# Patient Record
Sex: Female | Born: 1960 | Race: White | Hispanic: No | Marital: Married | Smoking: Never smoker
Health system: Southern US, Community
[De-identification: ages and names within clinical notes are randomized; demographics above are authoritative.]

## PROBLEM LIST (undated history)

## (undated) DIAGNOSIS — J45909 Unspecified asthma, uncomplicated: Secondary | ICD-10-CM

## (undated) DIAGNOSIS — I10 Essential (primary) hypertension: Secondary | ICD-10-CM

## (undated) DIAGNOSIS — M1990 Unspecified osteoarthritis, unspecified site: Secondary | ICD-10-CM

## (undated) DIAGNOSIS — E119 Type 2 diabetes mellitus without complications: Secondary | ICD-10-CM

## (undated) DIAGNOSIS — N200 Calculus of kidney: Secondary | ICD-10-CM

---

## 2007-10-10 ENCOUNTER — Other Ambulatory Visit: Payer: Self-pay

## 2007-10-10 ENCOUNTER — Ambulatory Visit: Payer: Self-pay | Admitting: Internal Medicine

## 2007-10-10 ENCOUNTER — Emergency Department: Payer: Self-pay | Admitting: Emergency Medicine

## 2011-01-13 LAB — HM PAP SMEAR: HM PAP: NEGATIVE

## 2011-08-29 ENCOUNTER — Ambulatory Visit: Payer: Self-pay | Admitting: Internal Medicine

## 2012-05-10 DIAGNOSIS — E785 Hyperlipidemia, unspecified: Secondary | ICD-10-CM | POA: Insufficient documentation

## 2012-05-10 DIAGNOSIS — E1169 Type 2 diabetes mellitus with other specified complication: Secondary | ICD-10-CM | POA: Insufficient documentation

## 2013-06-22 ENCOUNTER — Ambulatory Visit: Payer: Self-pay | Admitting: Emergency Medicine

## 2013-09-13 ENCOUNTER — Ambulatory Visit: Payer: Self-pay | Admitting: Physician Assistant

## 2013-09-23 DIAGNOSIS — K429 Umbilical hernia without obstruction or gangrene: Secondary | ICD-10-CM | POA: Insufficient documentation

## 2013-10-07 ENCOUNTER — Ambulatory Visit: Payer: Self-pay | Admitting: Physician Assistant

## 2013-10-07 LAB — CBC WITH DIFFERENTIAL/PLATELET
BASOS ABS: 0.1 10*3/uL (ref 0.0–0.1)
Basophil %: 0.8 %
EOS ABS: 0.4 10*3/uL (ref 0.0–0.7)
Eosinophil %: 4.2 %
HCT: 40.9 % (ref 35.0–47.0)
HGB: 13.6 g/dL (ref 12.0–16.0)
Lymphocyte #: 1.9 10*3/uL (ref 1.0–3.6)
Lymphocyte %: 22.2 %
MCH: 28.4 pg (ref 26.0–34.0)
MCHC: 33.2 g/dL (ref 32.0–36.0)
MCV: 85 fL (ref 80–100)
Monocyte #: 0.7 x10 3/mm (ref 0.2–0.9)
Monocyte %: 8.3 %
NEUTROS PCT: 64.5 %
Neutrophil #: 5.6 10*3/uL (ref 1.4–6.5)
Platelet: 390 10*3/uL (ref 150–440)
RBC: 4.79 10*6/uL (ref 3.80–5.20)
RDW: 14.1 % (ref 11.5–14.5)
WBC: 8.7 10*3/uL (ref 3.6–11.0)

## 2013-10-07 LAB — URINALYSIS, COMPLETE
BACTERIA: NEGATIVE
Bilirubin,UR: NEGATIVE
GLUCOSE, UR: NEGATIVE mg/dL (ref 0–75)
Ketone: NEGATIVE
Leukocyte Esterase: NEGATIVE
Nitrite: NEGATIVE
Ph: 6 (ref 4.5–8.0)
Protein: NEGATIVE
SPECIFIC GRAVITY: 1.01 (ref 1.003–1.030)
WBC UR: NONE SEEN /HPF (ref 0–5)

## 2013-10-07 LAB — COMPREHENSIVE METABOLIC PANEL
ALBUMIN: 3.6 g/dL (ref 3.4–5.0)
AST: 27 U/L (ref 15–37)
Alkaline Phosphatase: 104 U/L
Anion Gap: 12 (ref 7–16)
BILIRUBIN TOTAL: 0.3 mg/dL (ref 0.2–1.0)
BUN: 11 mg/dL (ref 7–18)
CO2: 27 mmol/L (ref 21–32)
CREATININE: 0.94 mg/dL (ref 0.60–1.30)
Calcium, Total: 9.5 mg/dL (ref 8.5–10.1)
Chloride: 100 mmol/L (ref 98–107)
EGFR (African American): >60
GLUCOSE: 146 mg/dL — AB (ref 65–99)
OSMOLALITY: 280 (ref 275–301)
Potassium: 3.8 mmol/L (ref 3.5–5.1)
SGPT (ALT): 54 U/L (ref 12–78)
SODIUM: 139 mmol/L (ref 136–145)
TOTAL PROTEIN: 7.9 g/dL (ref 6.4–8.2)

## 2013-10-07 LAB — LIPASE, BLOOD: Lipase: 95 U/L (ref 73–393)

## 2013-10-07 LAB — AMYLASE: AMYLASE: 57 U/L (ref 25–115)

## 2013-12-01 ENCOUNTER — Ambulatory Visit: Payer: Self-pay | Admitting: Physician Assistant

## 2013-12-01 LAB — RAPID STREP-A WITH REFLX: MICRO TEXT REPORT: NEGATIVE

## 2013-12-01 LAB — RAPID INFLUENZA A&B ANTIGENS

## 2013-12-04 LAB — BETA STREP CULTURE(ARMC)

## 2014-01-09 ENCOUNTER — Ambulatory Visit: Payer: Self-pay | Admitting: Urology

## 2014-01-09 DIAGNOSIS — I10 Essential (primary) hypertension: Secondary | ICD-10-CM

## 2014-01-09 DIAGNOSIS — Z01810 Encounter for preprocedural cardiovascular examination: Secondary | ICD-10-CM

## 2014-01-09 LAB — BASIC METABOLIC PANEL
Anion Gap: 7 (ref 7–16)
BUN: 15 mg/dL (ref 7–18)
CO2: 29 mmol/L (ref 21–32)
CREATININE: 0.92 mg/dL (ref 0.60–1.30)
Calcium, Total: 8.6 mg/dL (ref 8.5–10.1)
Chloride: 103 mmol/L (ref 98–107)
GLUCOSE: 165 mg/dL — AB (ref 65–99)
Osmolality: 282 (ref 275–301)
Potassium: 3.5 mmol/L (ref 3.5–5.1)
SODIUM: 139 mmol/L (ref 136–145)

## 2014-01-11 ENCOUNTER — Ambulatory Visit: Payer: Self-pay | Admitting: Urology

## 2014-01-11 HISTORY — PX: LITHOTRIPSY: SUR834

## 2014-01-13 ENCOUNTER — Emergency Department: Payer: Self-pay | Admitting: Emergency Medicine

## 2014-01-13 LAB — BASIC METABOLIC PANEL
Anion Gap: 9 (ref 7–16)
BUN: 14 mg/dL (ref 7–18)
CALCIUM: 9.6 mg/dL (ref 8.5–10.1)
CHLORIDE: 99 mmol/L (ref 98–107)
Co2: 27 mmol/L (ref 21–32)
Creatinine: 1.19 mg/dL (ref 0.60–1.30)
EGFR (African American): >60
GFR CALC NON AF AMER: 53 — AB
GLUCOSE: 141 mg/dL — AB (ref 65–99)
Osmolality: 273 (ref 275–301)
POTASSIUM: 3.4 mmol/L — AB (ref 3.5–5.1)
SODIUM: 135 mmol/L — AB (ref 136–145)

## 2014-01-13 LAB — URINALYSIS, COMPLETE
Bacteria: NONE SEEN
Bilirubin,UR: NEGATIVE
Glucose,UR: NEGATIVE mg/dL (ref 0–75)
Leukocyte Esterase: NEGATIVE
Nitrite: NEGATIVE
PH: 5 (ref 4.5–8.0)
Protein: NEGATIVE
RBC,UR: 22 /HPF (ref 0–5)
SPECIFIC GRAVITY: 1.015 (ref 1.003–1.030)
Squamous Epithelial: 5
WBC UR: 3 /HPF (ref 0–5)

## 2014-01-13 LAB — CBC WITH DIFFERENTIAL/PLATELET
BASOS ABS: 0.1 10*3/uL (ref 0.0–0.1)
Basophil %: 0.6 %
Eosinophil #: 0.2 10*3/uL (ref 0.0–0.7)
Eosinophil %: 1.4 %
HCT: 41.8 % (ref 35.0–47.0)
HGB: 14 g/dL (ref 12.0–16.0)
LYMPHS PCT: 14.5 %
Lymphocyte #: 2 10*3/uL (ref 1.0–3.6)
MCH: 28.5 pg (ref 26.0–34.0)
MCHC: 33.5 g/dL (ref 32.0–36.0)
MCV: 85 fL (ref 80–100)
Monocyte #: 1 x10 3/mm — ABNORMAL HIGH (ref 0.2–0.9)
Monocyte %: 7.3 %
Neutrophil #: 10.5 10*3/uL — ABNORMAL HIGH (ref 1.4–6.5)
Neutrophil %: 76.2 %
Platelet: 383 10*3/uL (ref 150–440)
RBC: 4.91 10*6/uL (ref 3.80–5.20)
RDW: 14 % (ref 11.5–14.5)
WBC: 13.8 10*3/uL — ABNORMAL HIGH (ref 3.6–11.0)

## 2014-01-18 ENCOUNTER — Ambulatory Visit: Payer: Self-pay | Admitting: Urology

## 2014-03-27 DIAGNOSIS — I8393 Asymptomatic varicose veins of bilateral lower extremities: Secondary | ICD-10-CM | POA: Insufficient documentation

## 2014-03-27 LAB — HM HEPATITIS C SCREENING LAB: HM HEPATITIS C SCREENING: NEGATIVE

## 2014-06-23 ENCOUNTER — Ambulatory Visit: Payer: Self-pay | Admitting: Physician Assistant

## 2014-09-10 DIAGNOSIS — I10 Essential (primary) hypertension: Secondary | ICD-10-CM | POA: Insufficient documentation

## 2015-03-06 ENCOUNTER — Emergency Department: Payer: Commercial Indemnity

## 2015-03-06 ENCOUNTER — Emergency Department
Admission: EM | Admit: 2015-03-06 | Discharge: 2015-03-06 | Disposition: A | Discharge status: Home / Self Care | Payer: Commercial Indemnity | Attending: Emergency Medicine | Admitting: Emergency Medicine

## 2015-03-06 DIAGNOSIS — Z3202 Encounter for pregnancy test, result negative: Secondary | ICD-10-CM | POA: Diagnosis not present

## 2015-03-06 DIAGNOSIS — R109 Unspecified abdominal pain: Secondary | ICD-10-CM | POA: Diagnosis present

## 2015-03-06 DIAGNOSIS — N23 Unspecified renal colic: Secondary | ICD-10-CM | POA: Insufficient documentation

## 2015-03-06 LAB — BASIC METABOLIC PANEL
Anion gap: 11 (ref 5–15)
BUN: 19 mg/dL (ref 6–20)
CALCIUM: 9.4 mg/dL (ref 8.9–10.3)
CO2: 25 mmol/L (ref 22–32)
Chloride: 100 mmol/L — ABNORMAL LOW (ref 101–111)
Creatinine, Ser: 1.24 mg/dL — ABNORMAL HIGH (ref 0.44–1.00)
GFR calc Af Amer: 57 mL/min — ABNORMAL LOW (ref >60)
GFR, EST NON AFRICAN AMERICAN: 49 mL/min — AB (ref >60)
Glucose, Bld: 191 mg/dL — ABNORMAL HIGH (ref 65–99)
Potassium: 3.8 mmol/L (ref 3.5–5.1)
SODIUM: 136 mmol/L (ref 135–145)

## 2015-03-06 LAB — URINALYSIS COMPLETE WITH MICROSCOPIC (ARMC ONLY)
Bacteria, UA: NONE SEEN
Bilirubin Urine: NEGATIVE
Glucose, UA: NEGATIVE mg/dL
KETONES UR: NEGATIVE mg/dL
Leukocytes, UA: NEGATIVE
Nitrite: NEGATIVE
PH: 5 (ref 5.0–8.0)
PROTEIN: NEGATIVE mg/dL
SPECIFIC GRAVITY, URINE: 1.023 (ref 1.005–1.030)

## 2015-03-06 LAB — CBC WITH DIFFERENTIAL/PLATELET
Basophils Absolute: 0.1 10*3/uL (ref 0–0.1)
Basophils Relative: 1 %
Eosinophils Absolute: 0.4 10*3/uL (ref 0–0.7)
Eosinophils Relative: 4 %
HEMATOCRIT: 39 % (ref 35.0–47.0)
HEMOGLOBIN: 13 g/dL (ref 12.0–16.0)
LYMPHS ABS: 3 10*3/uL (ref 1.0–3.6)
LYMPHS PCT: 31 %
MCH: 28 pg (ref 26.0–34.0)
MCHC: 33.4 g/dL (ref 32.0–36.0)
MCV: 83.9 fL (ref 80.0–100.0)
MONO ABS: 0.8 10*3/uL (ref 0.2–0.9)
MONOS PCT: 9 %
NEUTROS ABS: 5.3 10*3/uL (ref 1.4–6.5)
NEUTROS PCT: 55 %
Platelets: 338 10*3/uL (ref 150–440)
RBC: 4.64 MIL/uL (ref 3.80–5.20)
RDW: 13.9 % (ref 11.5–14.5)
WBC: 9.6 10*3/uL (ref 3.6–11.0)

## 2015-03-06 LAB — PREGNANCY, URINE: PREG TEST UR: NEGATIVE

## 2015-03-06 MED ORDER — TAMSULOSIN HCL 0.4 MG PO CAPS: 0.4000 mg | ORAL_CAPSULE | Freq: Every day | ORAL | Status: DC

## 2015-03-06 MED ORDER — KETOROLAC TROMETHAMINE 30 MG/ML IJ SOLN
INTRAMUSCULAR | Status: AC
  Administered 2015-03-06: 30 mg via INTRAVENOUS
  Filled 2015-03-06: qty 1

## 2015-03-06 MED ORDER — SODIUM CHLORIDE 0.9 % IV BOLUS (SEPSIS)
1000.0000 mL | Freq: Once | INTRAVENOUS | Status: AC
  Administered 2015-03-06: 1000 mL via INTRAVENOUS

## 2015-03-06 MED ORDER — KETOROLAC TROMETHAMINE 10 MG PO TABS: 10.0000 mg | ORAL_TABLET | Freq: Four times a day (QID) | ORAL | Status: DC | PRN

## 2015-03-06 MED ORDER — ONDANSETRON HCL 4 MG PO TABS: 4.0000 mg | ORAL_TABLET | Freq: Four times a day (QID) | ORAL | Status: DC | PRN

## 2015-03-06 MED ORDER — KETOROLAC TROMETHAMINE 30 MG/ML IJ SOLN
30.0000 mg | Freq: Once | INTRAMUSCULAR | Status: AC
  Administered 2015-03-06: 30 mg via INTRAVENOUS

## 2015-03-06 NOTE — Discharge Instructions (Signed)
Kidney Stones °Kidney stones (urolithiasis) are deposits that form inside your kidneys. The intense pain is caused by the stone moving through the urinary tract. When the stone moves, the ureter goes into spasm around the stone. The stone is usually passed in the urine.  °CAUSES  °· A disorder that makes certain neck glands produce too much parathyroid hormone (primary hyperparathyroidism). °· A buildup of uric acid crystals, similar to gout in your joints. °· Narrowing (stricture) of the ureter. °· A kidney obstruction present at birth (congenital obstruction). °· Previous surgery on the kidney or ureters. °· Numerous kidney infections. °SYMPTOMS  °· Feeling sick to your stomach (nauseous). °· Throwing up (vomiting). °· Blood in the urine (hematuria). °· Pain that usually spreads (radiates) to the groin. °· Frequency or urgency of urination. °DIAGNOSIS  °· Taking a history and physical exam. °· Blood or urine tests. °· CT scan. °· Occasionally, an examination of the inside of the urinary bladder (cystoscopy) is performed. °TREATMENT  °· Observation. °· Increasing your fluid intake. °· Extracorporeal shock wave lithotripsy--This is a noninvasive procedure that uses shock waves to break up kidney stones. °· Surgery may be needed if you have severe pain or persistent obstruction. There are various surgical procedures. Most of the procedures are performed with the use of small instruments. Only small incisions are needed to accommodate these instruments, so recovery time is minimized. °The size, location, and chemical composition are all important variables that will determine the proper choice of action for you. Talk to your health care provider to better understand your situation so that you will minimize the risk of injury to yourself and your kidney.  °HOME CARE INSTRUCTIONS  °· Drink enough water and fluids to keep your urine clear or pale yellow. This will help you to pass the stone or stone fragments. °· Strain  all urine through the provided strainer. Keep all particulate matter and stones for your health care provider to see. The stone causing the pain may be as small as a grain of salt. It is very important to use the strainer each and every time you pass your urine. The collection of your stone will allow your health care provider to analyze it and verify that a stone has actually passed. The stone analysis will often identify what you can do to reduce the incidence of recurrences. °· Only take over-the-counter or prescription medicines for pain, discomfort, or fever as directed by your health care provider. °· Make a follow-up appointment with your health care provider as directed. °· Get follow-up X-rays if required. The absence of pain does not always mean that the stone has passed. It may have only stopped moving. If the urine remains completely obstructed, it can cause loss of kidney function or even complete destruction of the kidney. It is your responsibility to make sure X-rays and follow-ups are completed. Ultrasounds of the kidney can show blockages and the status of the kidney. Ultrasounds are not associated with any radiation and can be performed easily in a matter of minutes. °SEEK MEDICAL CARE IF: °· You experience pain that is progressive and unresponsive to any pain medicine you have been prescribed. °SEEK IMMEDIATE MEDICAL CARE IF:  °· Pain cannot be controlled with the prescribed medicine. °· You have a fever or shaking chills. °· The severity or intensity of pain increases over 18 hours and is not relieved by pain medicine. °· You develop a new onset of abdominal pain. °· You feel faint or pass out. °·   You are unable to urinate. MAKE SURE YOU:   Understand these instructions.  Will watch your condition.  Will get help right away if you are not doing well or get worse. Document Released: 08/31/2005 Document Revised: 05/03/2013 Document Reviewed: 02/01/2013 [facility] Patient Information 2015  [place], [place]. This information is not intended to replace advice given to you by your health care provider. Make sure you discuss any questions you have with your health care provider.  Abdominal Pain Many things can cause abdominal pain. Usually, abdominal pain is not caused by a disease and will improve without treatment. It can often be observed and treated at home. Your health care provider will do a physical exam and possibly order blood tests and X-rays to help determine the seriousness of your pain. However, in many cases, more time must pass before a clear cause of the pain can be found. Before that point, your health care provider may not know if you need more testing or further treatment. HOME CARE INSTRUCTIONS  Monitor your abdominal pain for any changes. The following actions may help to alleviate any discomfort you are experiencing:  Only take over-the-counter or prescription medicines as directed by your health care provider.  Do not take laxatives unless directed to do so by your health care provider.  Try a clear liquid diet (broth, tea, or water) as directed by your health care provider. Slowly move to a bland diet as tolerated. SEEK MEDICAL CARE IF:  You have unexplained abdominal pain.  You have abdominal pain associated with nausea or diarrhea.  You have pain when you urinate or have a bowel movement.  You experience abdominal pain that wakes you in the night.  You have abdominal pain that is worsened or improved by eating food.  You have abdominal pain that is worsened with eating fatty foods.  You have a fever. SEEK IMMEDIATE MEDICAL CARE IF:   Your pain does not go away within 2 hours.  You keep throwing up (vomiting).  Your pain is felt only in portions of the abdomen, such as the right side or the left lower portion of the abdomen.  You pass bloody or black tarry stools. MAKE SURE YOU:  Understand these instructions.   Will watch your condition.    Will get help right away if you are not doing well or get worse.  Document Released: 06/10/2005 Document Revised: 09/05/2013 Document Reviewed: 05/10/2013 [facility] Patient Information 2015 [place], [place]. This information is not intended to replace advice given to you by your health care provider. Make sure you discuss any questions you have with your health care provider.

## 2015-03-06 NOTE — ED Provider Notes (Signed)
  Physical Exam  BP 158/91 mmHg  Pulse 112  Temp(Src) 97.9 F (36.6 C)  Resp 18  Ht 5\' 5"  (1.651 m)  Wt 300 lb (136.079 kg)  BMI 49.92 kg/m2  SpO2 97%  Physical Exam Patient resting comfortably. No signs of distress. Pain-free at this time. ED Course  Procedures  MDM CAT scan of the abdomen shows 2 adjacent stones up to 6 mm within the bladder lumen just be on the left UVJ. Mild left hydroureteronephrosis.  Reviewed the imaging results with the patient. Patient has contact info for her urologist at home. Will call for an appointment to be seen within the next week. We'll discharge to home.      Orbie Pyo, MD 03/06/15 2152

## 2015-03-06 NOTE — ED Notes (Signed)
Return from CT scan via stretcher.

## 2015-03-06 NOTE — ED Notes (Signed)
Pt in with co left flank pain since 1700 hx of kidney stone feels the same.

## 2015-03-06 NOTE — ED Notes (Signed)
First nurse note: pt arrived to STAT desk with c/o left sided flank pain that radiates to left groin. Appears uncomfortable and restless.

## 2015-03-06 NOTE — ED Provider Notes (Signed)
[facility] Emergency Department Provider Note  ____________________________________________  Time seen: 7:50 PM  I have reviewed the triage vital signs and the nursing notes.   HISTORY  Chief Complaint Flank Pain    HPI Joy Roberts is a 54 y.o. female who complains of left flank pain since a cough about 5 PM. She has a history of kidney stones including some one year ago that had to be treated with lithotripsy. She notes dysuria and left flank pain radiating to the left groin which is sharp, no aggravating or alleviating factors, intermittent and severe. No chest pain shortness of breath headache syncope fever chills abdominal pain nausea vomiting diarrhea. She is eating and drinking normally today.     No past medical history on file. Kidney stones Morbid obesity There are no active problems to display for this patient.   No past surgical history on file. Lithotripsy No current outpatient prescriptions on file.  Allergies Codeine and Sulfa antibiotics  No family history on file.  Social History History  Substance Use Topics  . Smoking status: Not on file  . Smokeless tobacco: Not on file  . Alcohol Use: Not on file    Review of Systems  Constitutional: No fever or chills. No weight changes Eyes:No blurry vision or double vision.  ENT: No sore throat. Cardiovascular: No chest pain. Respiratory: No dyspnea or cough. Gastrointestinal: Negative for abdominal pain, vomiting and diarrhea.  No BRBPR or melena. Genitourinary: Dysuria today. Musculoskeletal: Negative for back pain. No joint swelling or pain. Skin: Negative for rash. Neurological: Negative for headaches, focal weakness or numbness. Psychiatric:No anxiety or depression.   Endocrine:No hot/cold intolerance, changes in energy, or sleep difficulty.  10-point ROS otherwise negative.  ____________________________________________   PHYSICAL EXAM:  VITAL SIGNS: ED  Triage Vitals  Enc Vitals Group     BP 03/06/15 1938 158/91 mmHg     Pulse Rate 03/06/15 1938 112     Resp 03/06/15 1938 18     Temp 03/06/15 1938 97.9 F (36.6 C)     Temp src --      SpO2 03/06/15 1938 97 %     Weight 03/06/15 1938 300 lb (136.079 kg)     Height 03/06/15 1938 5\' 5"  (1.651 m)     Head Cir --      Peak Flow --      Pain Score 03/06/15 1939 10     Pain Loc --      Pain Edu? --      Excl. in [place]? --    Heart rate 90 on my exam  Constitutional: Alert and oriented. Mild distress due to pain. Eyes: No scleral icterus. No conjunctival pallor. PERRL. EOMI ENT   Head: Normocephalic and atraumatic.   Nose: No congestion/rhinnorhea. No septal hematoma   Mouth/Throat: MMM, no pharyngeal erythema. No peritonsillar mass. No uvula shift.   Neck: No stridor. No SubQ emphysema. No meningismus. Hematological/Lymphatic/Immunilogical: No cervical lymphadenopathy. Cardiovascular: RRR. Normal and symmetric distal pulses are present in all extremities. No murmurs, rubs, or gallops. Respiratory: Normal respiratory effort without tachypnea nor retractions. Breath sounds are clear and equal bilaterally. No wheezes/rales/rhonchi. Gastrointestinal: Soft and nontender. No distention. There is no CVA tenderness.  No rebound, rigidity, or guarding. Genitourinary: deferred Musculoskeletal: Nontender with normal range of motion in all extremities. No joint effusions.  No lower extremity tenderness.  No edema. No midline spinal tenderness. No CVA tenderness. No musculoskeletal or soft tissue tenderness in the lower back and  left flank Neurologic:   Normal speech and language.  CN 2-10 normal. Motor grossly intact. No pronator drift.  Normal gait. No gross focal neurologic deficits are appreciated.  Skin:  Skin is warm, dry and intact. No rash noted.  No petechiae, purpura, or bullae. Psychiatric: Mood and affect are normal. Speech and behavior are normal. Patient exhibits  appropriate insight and judgment.  ____________________________________________    LABS (pertinent positives/negatives) (all labs ordered are listed, but only abnormal results are displayed) Labs Reviewed  BASIC METABOLIC PANEL - Abnormal; Notable for the following:    Chloride 100 (*)    Glucose, Bld 191 (*)    Creatinine, Ser 1.24 (*)    GFR calc non Af Amer 49 (*)    GFR calc Af Amer 57 (*)    All other components within normal limits  URINALYSIS COMPLETEWITH MICROSCOPIC ([facility] ONLY) - Abnormal; Notable for the following:    Color, Urine YELLOW (*)    APPearance HAZY (*)    Hgb urine dipstick 1+ (*)    Squamous Epithelial / LPF 6-30 (*)    All other components within normal limits  CBC WITH DIFFERENTIAL/PLATELET  PREGNANCY, URINE  POC URINE PREG, ED   ____________________________________________   EKG    ____________________________________________    RADIOLOGY  CT abdomen and pelvis renal stone study pending  ____________________________________________   PROCEDURES  ____________________________________________   INITIAL IMPRESSION / ASSESSMENT AND PLAN / ED COURSE  Pertinent labs & imaging results that were available during my care of the patient were reviewed by me and considered in my medical decision making (see chart for details).  Patient presents with symptoms consistent with renal colic. Low suspicion of abdominal surgical pathology pyelonephritis or sepsis. Nontoxic and overall well appearing although she does have significant pain. We'll give her IV Toradol as well as fluids and Zofran while waiting for labs and a CT scan. With her prior history of large stones requiring intervention, we'll make sure that she doesn't have urinary obstruction with this new episode.  ----------------------------------------- 8:55 PM on 03/06/2015 -----------------------------------------  Labs unremarkable. Urinalysis unremarkable. Pain is resolved with IV  Toradol. CT pending. Without high-grade obstruction or complications on CT, patient should be suitable for discharge home with oral medications to treat her renal colic. However follow-up with urology as needed within about a week. Care of the patient will be signed out to [facility] Dad to follow up on CT and disposition. Patient is in good condition, medically stable.  ____________________________________________   FINAL CLINICAL IMPRESSION(S) / ED DIAGNOSES  Final diagnoses:  Renal colic on left side      Carrie Mew, MD 03/06/15 2056

## 2015-03-06 NOTE — ED Notes (Signed)
AAOx3.  Skin warm and dry.  States pain resolved.  IVF infusing. Continue to monitor.

## 2015-03-06 NOTE — ED Notes (Signed)
AAOx3.  Skin warm and dry.  NAD.  D/C home with family.  Ambulates with easy and steady gait, posture relaxed.

## 2015-08-31 ENCOUNTER — Ambulatory Visit
Admission: EM | Admit: 2015-08-31 | Discharge: 2015-08-31 | Disposition: A | Discharge status: Home / Self Care | Payer: Commercial Indemnity | Attending: Internal Medicine | Admitting: Internal Medicine

## 2015-08-31 ENCOUNTER — Encounter: Payer: Self-pay | Admitting: Emergency Medicine

## 2015-08-31 DIAGNOSIS — B349 Viral infection, unspecified: Secondary | ICD-10-CM

## 2015-08-31 DIAGNOSIS — B9789 Other viral agents as the cause of diseases classified elsewhere: Secondary | ICD-10-CM

## 2015-08-31 DIAGNOSIS — J45901 Unspecified asthma with (acute) exacerbation: Secondary | ICD-10-CM

## 2015-08-31 DIAGNOSIS — J988 Other specified respiratory disorders: Secondary | ICD-10-CM

## 2015-08-31 HISTORY — DX: Calculus of kidney: N20.0

## 2015-08-31 HISTORY — DX: Unspecified asthma, uncomplicated: J45.909

## 2015-08-31 MED ORDER — BENZONATATE 200 MG PO CAPS: 200.0000 mg | ORAL_CAPSULE | Freq: Three times a day (TID) | ORAL | Status: DC | PRN

## 2015-08-31 MED ORDER — PREDNISONE 50 MG PO TABS: 50.0000 mg | ORAL_TABLET | Freq: Every day | ORAL | Status: DC

## 2015-08-31 MED ORDER — ALBUTEROL SULFATE (2.5 MG/3ML) 0.083% IN NEBU: 2.5000 mg | INHALATION_SOLUTION | Freq: Four times a day (QID) | RESPIRATORY_TRACT | Status: DC | PRN

## 2015-08-31 NOTE — ED Notes (Signed)
Congested, cough, runny nose , wheezing for 3 days

## 2015-08-31 NOTE — ED Provider Notes (Signed)
CSN: [id number]     Arrival date & time 08/31/15  1151 History   First MD Initiated Contact with Patient 08/31/15 1331     Chief Complaint  Patient presents with  . URI  . Asthma   HPI  Patient is a 54 year old lady with past medical history notable for asthma. She presents today with a 2 day history of cough, wheezing, chest congestion, with some head congestion and nasal drainage. No fever. Some malaise. Her husband and son have similar symptoms. No stomach upset reported.  Past Medical History  Diagnosis Date  . Kidney stones   . Asthma    Past Surgical History  Procedure Laterality Date  . Cesarean section     No family history on file. Social History  Substance Use Topics  . Smoking status: None  . Smokeless tobacco: None  . Alcohol Use: None    Review of Systems  All other systems reviewed and are negative.   Allergies  Codeine and Sulfa antibiotics  Home Medications   Prior to Admission medications   Medication Sig Start Date End Date Taking? Authorizing Provider  amLODipine-benazepril (LOTREL) 10-20 MG capsule Take 1 capsule by mouth daily.   Yes Historical Provider, MD  atorvastatin (LIPITOR) 10 MG tablet Take 10 mg by mouth daily.   Yes Historical Provider, MD  hydrochlorothiazide (HYDRODIURIL) 25 MG tablet Take 25 mg by mouth daily.   Yes Historical Provider, MD  ketorolac (TORADOL) 10 MG tablet Take 1 tablet (10 mg total) by mouth every 6 (six) hours as needed for moderate pain. 03/06/15  Yes Carrie Mew, MD  meloxicam (MOBIC) 15 MG tablet Take 15 mg by mouth daily.   Yes Historical Provider, MD  metFORMIN (GLUCOPHAGE) 500 MG tablet Take 500 mg by mouth 4 (four) times daily.   Yes Historical Provider, MD  ondansetron (ZOFRAN) 4 MG tablet Take 1 tablet (4 mg total) by mouth every 6 (six) hours as needed for nausea or vomiting. 03/06/15  Yes Carrie Mew, MD  sitaGLIPtin (JANUVIA) 50 MG tablet Take 50 mg by mouth daily.   Yes Historical Provider, MD   tamsulosin (FLOMAX) 0.4 MG CAPS capsule Take 1 capsule (0.4 mg total) by mouth daily. 03/06/15  Yes Carrie Mew, MD                         BP 103/71 mmHg  Pulse 112  Temp(Src) 98.9 F (37.2 C) (Oral)  Resp 16  Ht 5\' 5"  (1.651 m)  Wt 290 lb (131.543 kg)  BMI 48.26 kg/m2  SpO2 94%   Physical Exam  Constitutional: She is oriented to person, place, and time. No distress.  Alert, nicely groomed  HENT:  Head: Atraumatic.  Bilateral TMs moderately dull, no erythema Mild to moderate nasal congestion Throat is a little bit red  Eyes:  Conjugate gaze, no eye redness/drainage  Neck: Neck supple.  Cardiovascular: Regular rhythm.   Heart rate 110s on exam  Pulmonary/Chest: No respiratory distress. She has no wheezes. She has no rales.  Coarse but symmetric breath sounds throughout, frequent cough with deep breathing, no splinting, good air excursion. Slightly increased respiratory effort noted  Abdominal: She exhibits no distension.  Musculoskeletal: Normal range of motion. She exhibits no edema.  No leg swelling  Neurological: She is alert and oriented to person, place, and time.  Skin: Skin is warm and dry.  Pink. No cyanosis  Nursing note and vitals reviewed.   ED Course  Procedures (  including critical care time)    MDM   1. Asthma exacerbation   2. Viral respiratory illness    Discharge Medication List as of 08/31/2015  1:43 PM    START taking these medications   Details  albuterol (PROVENTIL) (2.5 MG/3ML) 0.083% nebulizer solution Take 3 mLs (2.5 mg total) by nebulization every 6 (six) hours as needed for wheezing or shortness of breath., Starting 08/31/2015, Until Discontinued, Normal    benzonatate (TESSALON) 200 MG capsule Take 1 capsule (200 mg total) by mouth 3 (three) times daily as needed for cough., Starting 08/31/2015, Until Discontinued, Normal    predniSONE (DELTASONE) 50 MG tablet Take 1 tablet (50 mg total) by mouth daily., Starting 08/31/2015,  Until Discontinued, Normal       No clear indication for antibiotics at this time. Recheck or follow up PCP, Dr. Iona Beard, in 2-3 days if not starting to improve, for new fever greater than 100.5, or increasing phlegm production. Anticipate gradual improvement in cough/congestion over the next 2-3 weeks.    Sherlene Shams, MD 09/04/15 1225

## 2015-08-31 NOTE — Discharge Instructions (Signed)
Prescriptions for albuterol nebulizer solution, prednisone, and tessalon (for cough) were sent to the [facility] in [place]. Recheck or followup with pcp/Dr Iona Beard in a few days if not starting to improve, for new fever >100.5, or increasing phlegm production. Anticipate gradual improvement in cough/congestion over the next 2-3 weeks.

## 2015-10-17 ENCOUNTER — Ambulatory Visit
Admission: EM | Admit: 2015-10-17 | Discharge: 2015-10-17 | Disposition: A | Discharge status: Home / Self Care | Payer: Commercial Indemnity | Attending: Family Medicine | Admitting: Family Medicine

## 2015-10-17 DIAGNOSIS — J0101 Acute recurrent maxillary sinusitis: Secondary | ICD-10-CM | POA: Diagnosis not present

## 2015-10-17 DIAGNOSIS — J4541 Moderate persistent asthma with (acute) exacerbation: Secondary | ICD-10-CM | POA: Diagnosis not present

## 2015-10-17 DIAGNOSIS — B9789 Other viral agents as the cause of diseases classified elsewhere: Secondary | ICD-10-CM

## 2015-10-17 DIAGNOSIS — J069 Acute upper respiratory infection, unspecified: Secondary | ICD-10-CM | POA: Diagnosis not present

## 2015-10-17 DIAGNOSIS — H6593 Unspecified nonsuppurative otitis media, bilateral: Secondary | ICD-10-CM | POA: Diagnosis not present

## 2015-10-17 MED ORDER — PREDNISONE 10 MG (21) PO TBPK: 10.0000 mg | ORAL_TABLET | Freq: Every day | ORAL | Status: AC

## 2015-10-17 MED ORDER — FLUTICASONE PROPIONATE 50 MCG/ACT NA SUSP: 1.0000 | Freq: Two times a day (BID) | NASAL | Status: DC

## 2015-10-17 MED ORDER — BUDESONIDE 0.5 MG/2ML IN SUSP: 0.5000 mg | Freq: Four times a day (QID) | RESPIRATORY_TRACT | Status: DC | PRN

## 2015-10-17 MED ORDER — DOXYCYCLINE HYCLATE 100 MG PO CAPS: 100.0000 mg | ORAL_CAPSULE | Freq: Two times a day (BID) | ORAL | Status: AC

## 2015-10-17 MED ORDER — LORATADINE 10 MG PO TABS: 10.0000 mg | ORAL_TABLET | Freq: Every day | ORAL | Status: DC

## 2015-10-17 MED ORDER — SALINE SPRAY 0.65 % NA SOLN: 2.0000 | NASAL | Status: DC

## 2015-10-17 NOTE — ED Provider Notes (Signed)
CSN: [id number]     Arrival date & time 10/17/15  1626 History   First MD Initiated Contact with Patient 10/17/15 1720     Chief Complaint  Patient presents with  . Cough   (Consider location/radiation/quality/duration/timing/severity/associated sxs/prior Treatment) HPI Comments: Married caucasian female here for evaluation of cough, hoarse voice, postnasal drip, wheezing.  Seen by Dr Valere Dross Dec 2016 for asthma exacerbation only took 2 days of prednisone 50mg  as blood sugars increased to 444 has been using son's budesonide nebs along with her albuterol neb for wheezing.  Left work early today.  Nasal congestion, upper gums swollen, sinus pressure/cheek pressure.  Has also tried afrin.  Zpak usually helps sinus infections.  Albuterol wearing off after 3 hours/wheezing.  Denied fever/headache.  Exposed to others sick at work.  Accountant/stressful busy season.  The history is provided by the patient.    Past Medical History  Diagnosis Date  . Kidney stones   . Asthma    Past Surgical History  Procedure Laterality Date  . Cesarean section     History reviewed. No pertinent family history. Social History  Substance Use Topics  . Smoking status: Never Smoker   . Smokeless tobacco: Never Used  . Alcohol Use: No   OB History    No data available     Review of Systems  Constitutional: Negative for fever, chills, diaphoresis, activity change, appetite change, fatigue and unexpected weight change.  HENT: Positive for congestion, ear pain, postnasal drip, rhinorrhea, sinus pressure, sore throat and voice change. Negative for dental problem, drooling, ear discharge, facial swelling, hearing loss, mouth sores, nosebleeds, sneezing, tinnitus and trouble swallowing.   Eyes: Negative for photophobia, pain, discharge, redness, itching and visual disturbance.  Respiratory: Positive for cough, chest tightness and wheezing. Negative for choking, shortness of breath and stridor.   Cardiovascular:  Negative for chest pain, palpitations and leg swelling.  Gastrointestinal: Negative for nausea, vomiting, abdominal pain, diarrhea, constipation, blood in stool and abdominal distention.  Endocrine: Negative for cold intolerance and heat intolerance.  Genitourinary: Negative for dysuria, hematuria and difficulty urinating.  Musculoskeletal: Negative for myalgias, back pain, joint swelling, arthralgias, gait problem, neck pain and neck stiffness.  Skin: Negative for color change, pallor, rash and wound.  Allergic/Immunologic: Positive for environmental allergies. Negative for food allergies.  Neurological: Negative for dizziness, tremors, seizures, syncope, facial asymmetry, speech difficulty, weakness, light-headedness, numbness and headaches.  Hematological: Negative for adenopathy. Does not bruise/bleed easily.  Psychiatric/Behavioral: Positive for sleep disturbance. Negative for behavioral problems, confusion and agitation.    Allergies  Codeine and Sulfa antibiotics  Home Medications   Prior to Admission medications   Medication Sig Start Date End Date Taking? Authorizing Provider  albuterol (PROVENTIL) (2.5 MG/3ML) 0.083% nebulizer solution Take 3 mLs (2.5 mg total) by nebulization every 6 (six) hours as needed for wheezing or shortness of breath. 08/31/15  Yes Sherlene Shams, MD  amLODipine-benazepril (LOTREL) 10-20 MG capsule Take 1 capsule by mouth daily.   Yes Historical Provider, MD  atorvastatin (LIPITOR) 10 MG tablet Take 10 mg by mouth daily.   Yes Historical Provider, MD  benzonatate (TESSALON) 200 MG capsule Take 1 capsule (200 mg total) by mouth 3 (three) times daily as needed for cough. 08/31/15  Yes Sherlene Shams, MD  hydrochlorothiazide (HYDRODIURIL) 25 MG tablet Take 25 mg by mouth daily.   Yes Historical Provider, MD  meloxicam (MOBIC) 15 MG tablet Take 15 mg by mouth daily.   Yes Historical Provider, MD  metFORMIN (GLUCOPHAGE) 500 MG tablet Take 1,000 mg by mouth 4  (four) times daily.    Yes Historical Provider, MD  sitaGLIPtin (JANUVIA) 50 MG tablet Take 50 mg by mouth daily.   Yes Historical Provider, MD  budesonide (PULMICORT) 0.5 MG/2ML nebulizer solution Take 2 mLs (0.5 mg total) by nebulization every 6 (six) hours as needed. 10/17/15 10/23/15  Olen Cordial, NP  doxycycline (VIBRAMYCIN) 100 MG capsule Take 1 capsule (100 mg total) by mouth 2 (two) times daily. X 10 days 10/19/15 10/29/15  Olen Cordial, NP  fluticasone (FLONASE) 50 MCG/ACT nasal spray Place 1 spray into both nostrils 2 (two) times daily. 10/17/15   Olen Cordial, NP  loratadine (CLARITIN) 10 MG tablet Take 1 tablet (10 mg total) by mouth daily. 10/17/15   Olen Cordial, NP  predniSONE (STERAPRED UNI-PAK 21 TAB) 10 MG (21) TBPK tablet Take 1 tablet (10 mg total) by mouth daily. Take 6 tabs by mouth daily, then 5 tabs for one day, then 4 tabs, then 3 tabs, 2 tabs for 1 day, then 1 tab by mouth x1 day 10/17/15 10/24/15  Olen Cordial, NP  sodium chloride (OCEAN) 0.65 % SOLN nasal spray Place 2 sprays into both nostrils every 2 (two) hours while awake. 10/17/15   Olen Cordial, NP   Meds Ordered and Administered this Visit  Medications - No data to display  BP 116/82 mmHg  Pulse 116  Temp(Src) 98.1 F (36.7 C) (Oral)  Resp 24  Ht 5\' 5"  (1.651 m)  Wt 290 lb (131.543 kg)  BMI 48.26 kg/m2  SpO2 100% No data found.   Physical Exam  Constitutional: She is oriented to person, place, and time. She appears well-developed and well-nourished. She is active and cooperative.  Non-toxic appearance. She does not have a sickly appearance. She appears ill. No distress.  HENT:  Head: Normocephalic and atraumatic.  Right Ear: Hearing, external ear and ear canal normal. A middle ear effusion is present.  Left Ear: Hearing, external ear and ear canal normal. A middle ear effusion is present.  Nose: Mucosal edema and rhinorrhea present. No nose lacerations, sinus tenderness, nasal deformity,  septal deviation or nasal septal hematoma. No epistaxis.  No foreign bodies. Right sinus exhibits maxillary sinus tenderness. Right sinus exhibits no frontal sinus tenderness. Left sinus exhibits maxillary sinus tenderness. Left sinus exhibits no frontal sinus tenderness.  Mouth/Throat: Uvula is midline and mucous membranes are normal. Mucous membranes are not pale, not dry and not cyanotic. She does not have dentures. No oral lesions. No trismus in the jaw. Normal dentition. No dental abscesses, uvula swelling, lacerations or dental caries. Posterior oropharyngeal edema and posterior oropharyngeal erythema present. No oropharyngeal exudate or tonsillar abscesses.  Cobblestoning posterior pharynx; bilateral TMs with air fluid level clear; bilateral nasal turbinates with edema/erythema clear discharge  Eyes: Conjunctivae, EOM and lids are normal. Pupils are equal, round, and reactive to light. Right eye exhibits no chemosis, no discharge, no exudate and no hordeolum. No foreign body present in the right eye. Left eye exhibits no chemosis, no discharge, no exudate and no hordeolum. No foreign body present in the left eye. Right conjunctiva is not injected. Right conjunctiva has no hemorrhage. Left conjunctiva is not injected. Left conjunctiva has no hemorrhage. No scleral icterus. Right eye exhibits normal extraocular motion and no nystagmus. Left eye exhibits normal extraocular motion and no nystagmus. Right pupil is round and reactive. Left pupil is round and reactive. Pupils are equal.  Neck: Trachea normal and normal range of motion. Neck supple. No tracheal tenderness, no spinous process tenderness and no muscular tenderness present. No rigidity. No tracheal deviation, no edema, no erythema and normal range of motion present. No thyroid mass and no thyromegaly present.  Cardiovascular: Normal rate, regular rhythm, S1 normal, S2 normal, normal heart sounds and intact distal pulses.  PMI is not displaced.   Exam reveals no gallop and no friction rub.   No murmur heard. Pulmonary/Chest: Effort normal and breath sounds normal. No accessory muscle usage or stridor. No respiratory distress. She has no decreased breath sounds. She has no wheezes. She has no rhonchi. She has no rales. She exhibits no tenderness.  Coughing spasms with deep breaths  Abdominal: Soft. She exhibits no distension.  Musculoskeletal: Normal range of motion. She exhibits no edema or tenderness.       Right shoulder: Normal.       Left shoulder: Normal.       Right hip: Normal.       Left hip: Normal.       Right knee: Normal.       Left knee: Normal.       Cervical back: Normal.       Right hand: Normal.       Left hand: Normal.  Lymphadenopathy:       Head (right side): No submental, no submandibular, no tonsillar, no preauricular, no posterior auricular and no occipital adenopathy present.       Head (left side): No submental, no submandibular, no tonsillar, no preauricular, no posterior auricular and no occipital adenopathy present.    She has no cervical adenopathy.       Right cervical: No superficial cervical, no deep cervical and no posterior cervical adenopathy present.      Left cervical: No superficial cervical, no deep cervical and no posterior cervical adenopathy present.  Neurological: She is alert and oriented to person, place, and time. She has normal strength. She is not disoriented. She displays no atrophy and no tremor. No cranial nerve deficit or sensory deficit. She exhibits normal muscle tone. She displays no seizure activity. Coordination and gait normal. GCS eye subscore is 4. GCS verbal subscore is 5. GCS motor subscore is 6.  Skin: Skin is warm, dry and intact. No abrasion, no bruising, no burn, no ecchymosis, no laceration, no lesion, no petechiae and no rash noted. She is not diaphoretic. No cyanosis or erythema. No pallor. Nails show no clubbing.  Psychiatric: She has a normal mood and affect. Her  speech is normal and behavior is normal. Judgment and thought content normal. Cognition and memory are normal.  Nursing note and vitals reviewed.   ED Course  Procedures (including critical care time)  Labs Review Labs Reviewed - No data to display  Imaging Review No results found.    MDM   1. Asthma with exacerbation, moderate persistent   2. Acute recurrent maxillary sinusitis   3. Otitis media with effusion, bilateral   4. Viral URI with cough    Continue tessalon pearles 200mg  po TID prn; albuterol inhaler 2 puffs po q4-6h prn or nebulizer.  If no improvement cough with flonase/claritin/saline start prednisone taper 14 days 6/5/4/3/2/1 Rx given.  Patient has been using son's budesonide ordered 0.5% solution q6h prn for her notified not to put albuterol and budesonide in nebulizer at same time.  Follow up with PCM if recurrent asthma exacerbation may need tweaking of chronic medications.  Continue to monitor  finger sticks as medications/illness can raise blood sugars.  Hydrate, hydrate, hydrate.  Humidifier use.  Honey with lemon/cough lozenges.  Bronchitis simple, community acquired, may have started as viral (probably respiratory syncytial, parainfluenza, influenza, or adenovirus), but now evidence of acute purulent bronchitis with resultant bronchial edema and mucus formation.  Viruses are the most common cause of bronchial inflammation in otherwise healthy adults with acute bronchitis.  The appearance of sputum is not predictive of whether a bacterial infection is present.  Purulent sputum is most often caused by viral infections.  There are a small portion of those caused by non-viral agents being Mycoplamsa pneumonia.  Microscopic examination or C&S of sputum in the healthy adult with acute bronchitis is generally not helpful (usually negative or normal respiratory flora) other considerations being cough from upper respiratory tract infections, sinusitis or allergic syndromes (mild  asthma or viral pneumonia).  Differential Diagnosis:  reactive airway disease (asthma, allergic aspergillosis (eosinophilia), chronic bronchitis, respiratory infection (Sinusitis, Common cold, pneumonia), congestive heart failure, reflux esophagitis, bronchogenic tumor, aspiration syndromes and/or exposure irritants/tobacco smoke.  In this case, there is no evidence of any invasive bacterial illness.  Most likely viral etiology so will hold on antibiotic treatment.  Advise supportive care with rest, encourage fluids, good hygiene and watch for any worsening symptoms.  If they were to develop:  come back to the office or go to the emergency room if after hours.  Without high fever, severe dyspnea, lack of physical findings or other risk factors, I will hold on a chest radiograph and CBC at this time.  I discussed that approximately 50% of patients with acute bronchitis have a cough that lasts up to three weeks, and 25% for over a month.  Tylenol, one to two tablets every four hours as needed for fever or myalgias.   No aspirin.  Patient instructed to follow up in one week or sooner if symptoms worsen. Patient verbalized agreement and understanding of treatment plan.  P2:  hand washing and cover cough  Medications as directed.  Patient is to return to the clinic or follow up with PCM if there is increased wheezing or shortness of breath, increased use of albuterol.  Patient educated on the importance of continuing to monitor their peak flows.  Patient given Exitcare handout on asthma.  Patient verbalized agreement and understanding of treatment plan.  P2:  Asthma action plan, fluids, and fitness  flonase 1 spray each nostril BID, nasal saline 2 sprays each nostril q2h prn congestion.  Claritin 10mg  po daily prn rhinitis.  If no relief 48 hours on flonase start doxycycline 100mg  po BID x 10 days. Rx given.  No evidence of systemic bacterial infection, non toxic and well hydrated.  I do not see where any further  testing or imaging is necessary at this time.   I will suggest supportive care, rest, good hygiene and encourage the patient to take adequate fluids.  The patient is to return to clinic or EMERGENCY ROOM if symptoms worsen or change significantly.  Exitcare handout on sinusitis given to patient.  Patient verbalized agreement and understanding of treatment plan and had no further questions at this time.   P2:  Hand washing and cover cough  Supportive treatment.   No evidence of invasive bacterial infection, non toxic and well hydrated.  This is most likely self limiting viral infection.  I do not see where any further testing or imaging is necessary at this time.   I will suggest supportive care,  rest, good hygiene and encourage the patient to take adequate fluids.  The patient is to return to clinic or EMERGENCY ROOM if symptoms worsen or change significantly e.g. ear pain, fever, purulent discharge from ears or bleeding.  Exitcare handout on otitis media with effusion given to patient.  Patient verbalized agreement and understanding of treatment plan.    Olen Cordial, NP 10/17/15 1755

## 2015-10-17 NOTE — Discharge Instructions (Signed)
Asthma Attack Prevention While you may not be able to control the fact that you have asthma, you can take actions to prevent asthma attacks. The best way to prevent asthma attacks is to maintain good control of your asthma. You can achieve this by: 1. Taking your medicines as directed. 2. Avoiding things that can irritate your airways or make your asthma symptoms worse (asthma triggers). 3. Keeping track of how well your asthma is controlled and of any changes in your symptoms. 4. Responding quickly to worsening asthma symptoms (asthma attack). 5. Seeking emergency care when it is needed. WHAT ARE SOME WAYS TO PREVENT AN ASTHMA ATTACK? Have a Plan Work with your health care provider to create a written plan for managing and treating your asthma attacks (asthma action plan). This plan includes:  A list of your asthma triggers and how you can avoid them.  Information on when medicines should be taken and when their dosages should be changed.  The use of a device that measures how well your lungs are working (peak flow meter). Monitor Your Asthma Use your peak flow meter and record your results in a journal every day. A drop in your peak flow numbers on one or more days may indicate the start of an asthma attack. This can happen even before you start to feel symptoms. You can prevent an asthma attack from getting worse by following the steps in your asthma action plan. Avoid Asthma Triggers Work with your asthma health care provider to find out what your asthma triggers are. This can be done by:  Allergy testing.  Keeping a journal that notes when asthma attacks occur and the factors that may have contributed to them.  Determining if there are other medical conditions that are making your asthma worse. Once you have determined your asthma triggers, take steps to avoid them. This may include avoiding excessive or prolonged exposure to:  Dust. Have someone dust and vacuum your home for you  once or twice a week. Using a high-efficiency particulate arrestance (HEPA) vacuum is best.  Smoke. This includes campfire smoke, forest fire smoke, and secondhand smoke from tobacco products.  Pet dander. Avoid contact with animals that you know you are allergic to.  Allergens from trees, grasses or pollens. Avoid spending a lot of time outdoors when pollen counts are high, and on very windy days.  Very cold, dry, or humid air.  Mold.  Foods that contain high amounts of sulfites.  Strong odors.  Outdoor air pollutants, such as [employer].  Indoor air pollutants, such as aerosol sprays and fumes from household cleaners.  Household pests, including dust mites and cockroaches, and pest droppings.  Certain medicines, including NSAIDs. Always talk to your health care provider before stopping or starting any new medicines. Medicines Take over-the-counter and prescription medicines only as told by your health care provider. Many asthma attacks can be prevented by carefully following your medicine schedule. Taking your medicines correctly is especially important when you cannot avoid certain asthma triggers. Act Quickly If an asthma attack does happen, acting quickly can decrease how severe it is and how long it lasts. Take these steps:   Pay attention to your symptoms. If you are coughing, wheezing, or having difficulty breathing, do not wait to see if your symptoms go away on their own. Follow your asthma action plan.  If you have followed your asthma action plan and your symptoms are not improving, call your health care provider or seek immediate medical care  at the nearest hospital. It is important to note how often you need to use your fast-acting rescue inhaler. If you are using your rescue inhaler more often, it may mean that your asthma is not under control. Adjusting your asthma treatment plan may help you to prevent future asthma attacks and help you to gain better control of  your condition. HOW CAN I PREVENT AN ASTHMA ATTACK WHEN I EXERCISE? Follow advice from your health care provider about whether you should use your fast-acting inhaler before exercising. Many people with asthma experience exercise-induced bronchoconstriction (EIB). This condition often worsens during vigorous exercise in cold, humid, or dry environments. Usually, people with EIB can stay very active by pre-treating with a fast-acting inhaler before exercising.   This information is not intended to replace advice given to you by your health care provider. Make sure you discuss any questions you have with your health care provider.   Document Released: 08/19/2009 Document Revised: 05/22/2015 Document Reviewed: 01/31/2015 Elsevier Interactive Patient Education 2016 [employer] Metered Dose Inhaler (No Spacer Used) Inhaled medicines are the basis of treatment for asthma and other breathing problems. Inhaled medicine can only be effective if used properly. Good technique assures that the medicine reaches the lungs. Metered dose inhalers (MDIs) are used to deliver a variety of inhaled medicines. These include quick relief or rescue medicines (such as bronchodilators) and controller medicines (such as corticosteroids). The medicine is delivered by pushing down on a metal canister to release a set amount of spray. If you are using different kinds of inhalers, use your quick relief medicine to open the airways 10-15 minutes before using a steroid, if instructed to do so by your health care provider. If you are unsure which inhalers to use and the order of using them, ask your health care provider, nurse, or respiratory therapist. HOW TO USE THE INHALER 6. Remove the cap from the inhaler. 7. If you are using the inhaler for the first time, you will need to prime it. Shake the inhaler for 5 seconds and release four puffs into the air, away from your face. Ask your health care provider or pharmacist if you have  questions about priming your inhaler. 8. Shake the inhaler for 5 seconds before each breath in (inhalation). 9. Position the inhaler so that the top of the canister faces up. 10. Put your index finger on the top of the medicine canister. Your thumb supports the bottom of the inhaler. 11. Open your mouth. 12. Either place the inhaler between your teeth and place your lips tightly around the mouthpiece, or hold the inhaler 1-2 inches away from your open mouth. If you are unsure of which technique to use, ask your health care provider. 13. Breathe out (exhale) normally and as completely as possible. 14. Press the canister down with the index finger to release the medicine. 15. At the same time as the canister is pressed, inhale deeply and slowly until your lungs are completely filled. This should take 4-6 seconds. Keep your tongue down. 16. Hold the medicine in your lungs for 5-10 seconds (10 seconds is best). This helps the medicine get into the small airways of your lungs. 17. Breathe out slowly, through pursed lips. Whistling is an example of pursed lips. 18. Wait at least 1 minute between puffs. Continue with the above steps until you have taken the number of puffs your health care provider has ordered. Do not use the inhaler more than your health care provider directs you to.  19. Replace the cap on the inhaler. 20. Follow the directions from your health care provider or the inhaler insert for cleaning the inhaler. If you are using a steroid inhaler, after your last puff, rinse your mouth with water, gargle, and spit out the water. Do not swallow the water. AVOID:  Inhaling before or after starting the spray of medicine. It takes practice to coordinate your breathing with triggering the spray.  Inhaling through the nose (rather than the mouth) when triggering the spray. HOW TO DETERMINE IF YOUR INHALER IS FULL OR NEARLY EMPTY You cannot know when an inhaler is empty by shaking it. Some inhalers  are now being made with dose counters. Ask your health care provider for a prescription that has a dose counter if you feel you need that extra help. If your inhaler does not have a counter, ask your health care provider to help you determine the date you need to refill your inhaler. Write the refill date on a calendar or your inhaler canister. Refill your inhaler 7-10 days before it runs out. Be sure to keep an adequate supply of medicine. This includes making sure it has not expired, and making sure you have a spare inhaler. SEEK MEDICAL CARE IF:  Symptoms are only partially relieved with your inhaler.  You are having trouble using your inhaler.  You experience an increase in phlegm. SEEK IMMEDIATE MEDICAL CARE IF:  You feel little or no relief with your inhalers. You are still wheezing and feeling shortness of breath, tightness in your chest, or both.  You have dizziness, headaches, or a fast heart rate.  You have chills, fever, or night sweats.  There is a noticeable increase in phlegm production, or there is blood in the phlegm. MAKE SURE YOU:  Understand these instructions.  Will watch your condition.  Will get help right away if you are not doing well or get worse.   This information is not intended to replace advice given to you by your health care provider. Make sure you discuss any questions you have with your health care provider.   Document Released: 06/28/2007 Document Revised: 09/21/2014 Document Reviewed: 02/16/2013 Elsevier Interactive Patient Education 2016 [employer] Inc. Acute Bronchitis Bronchitis is inflammation of the airways that extend from the windpipe into the lungs (bronchi). The inflammation often causes mucus to develop. This leads to a cough, which is the most common symptom of bronchitis.  In acute bronchitis, the condition usually develops suddenly and goes away over time, usually in a couple weeks. Smoking, allergies, and asthma can make bronchitis  worse. Repeated episodes of bronchitis may cause further lung problems.  CAUSES Acute bronchitis is most often caused by the same virus that causes a cold. The virus can spread from person to person (contagious) through coughing, sneezing, and touching contaminated objects. SIGNS AND SYMPTOMS  21. Cough.  22. Fever.  23. Coughing up mucus.  24. Body aches.  25. Chest congestion.  26. Chills.  27. Shortness of breath.  28. Sore throat.  DIAGNOSIS  Acute bronchitis is usually diagnosed through a physical exam. Your health care provider will also ask you questions about your medical history. Tests, such as chest X-rays, are sometimes done to rule out other conditions.  TREATMENT  Acute bronchitis usually goes away in a couple weeks. Oftentimes, no medical treatment is necessary. Medicines are sometimes given for relief of fever or cough. Antibiotic medicines are usually not needed but may be prescribed in certain situations. In some cases, an  inhaler may be recommended to help reduce shortness of breath and control the cough. A cool mist vaporizer may also be used to help thin bronchial secretions and make it easier to clear the chest.  HOME CARE INSTRUCTIONS  Get plenty of rest.   Drink enough fluids to keep your urine clear or pale yellow (unless you have a medical condition that requires fluid restriction). Increasing fluids may help thin your respiratory secretions (sputum) and reduce chest congestion, and it will prevent dehydration.   Take medicines only as directed by your health care provider.  If you were prescribed an antibiotic medicine, finish it all even if you start to feel better.  Avoid smoking and secondhand smoke. Exposure to cigarette smoke or irritating chemicals will make bronchitis worse. If you are a smoker, consider using nicotine gum or skin patches to help control withdrawal symptoms. Quitting smoking will help your lungs heal faster.   Reduce the chances  of another bout of acute bronchitis by washing your hands frequently, avoiding people with cold symptoms, and trying not to touch your hands to your mouth, nose, or eyes.   Keep all follow-up visits as directed by your health care provider.  SEEK MEDICAL CARE IF: Your symptoms do not improve after 1 week of treatment.  SEEK IMMEDIATE MEDICAL CARE IF:  You develop an increased fever or chills.   You have chest pain.   You have severe shortness of breath.  You have bloody sputum.   You develop dehydration.  You faint or repeatedly feel like you are going to pass out.  You develop repeated vomiting.  You develop a severe headache. MAKE SURE YOU:   Understand these instructions.  Will watch your condition.  Will get help right away if you are not doing well or get worse.   This information is not intended to replace advice given to you by your health care provider. Make sure you discuss any questions you have with your health care provider.   Document Released: 10/08/2004 Document Revised: 09/21/2014 Document Reviewed: 02/21/2013 Elsevier Interactive Patient Education 2016 [place]. Otitis Media With Effusion Otitis media with effusion is the presence of fluid in the middle ear. This is a common problem in children, which often follows ear infections. It may be present for weeks or longer after the infection. Unlike an acute ear infection, otitis media with effusion refers only to fluid behind the ear drum and not infection. Children with repeated ear and sinus infections and allergy problems are the most likely to get otitis media with effusion. CAUSES  The most frequent cause of the fluid buildup is dysfunction of the eustachian tubes. These are the tubes that drain fluid in the ears to the back of the nose (nasopharynx). SYMPTOMS  29. The main symptom of this condition is hearing loss. As a result, you or your child may: 1. Listen to the TV at a loud volume. 2. Not  respond to questions. 3. Ask "what" often when spoken to. 4. Mistake or confuse one sound or word for another. 30. There may be a sensation of fullness or pressure but usually not pain. DIAGNOSIS   Your health care provider will diagnose this condition by examining you or your child's ears.  Your health care provider may test the pressure in you or your child's ear with a tympanometer.  A hearing test may be conducted if the problem persists. TREATMENT   Treatment depends on the duration and the effects of the effusion.  Antibiotics, decongestants, nose drops, and cortisone-type drugs (tablets or nasal spray) may not be helpful.  Children with persistent ear effusions may have delayed language or behavioral problems. Children at risk for developmental delays in hearing, learning, and speech may require referral to a specialist earlier than children not at risk.  You or your child's health care provider may suggest a referral to an ear, nose, and throat surgeon for treatment. The following may help restore normal hearing:  Drainage of fluid.  Placement of ear tubes (tympanostomy tubes).  Removal of adenoids (adenoidectomy). HOME CARE INSTRUCTIONS   Avoid secondhand smoke.  Infants who are breastfed are less likely to have this condition.  Avoid feeding infants while they are lying flat.  Avoid known environmental allergens.  Avoid people who are sick. SEEK MEDICAL CARE IF:   Hearing is not better in 3 months.  Hearing is worse.  Ear pain.  Drainage from the ear.  Dizziness. MAKE SURE YOU:   Understand these instructions.  Will watch your condition.  Will get help right away if you are not doing well or get worse.   This information is not intended to replace advice given to you by your health care provider. Make sure you discuss any questions you have with your health care provider.   Document Released: 10/08/2004 Document Revised: 09/21/2014 Document  Reviewed: 03/28/2013 Elsevier Interactive Patient Education 2016 [employer]. Allergic Rhinitis Allergic rhinitis is when the mucous membranes in the nose respond to allergens. Allergens are particles in the air that cause your body to have an allergic reaction. This causes you to release allergic antibodies. Through a chain of events, these eventually cause you to release histamine into the blood stream. Although meant to protect the body, it is this release of histamine that causes your discomfort, such as frequent sneezing, congestion, and an itchy, runny nose.  CAUSES Seasonal allergic rhinitis (hay fever) is caused by pollen allergens that may come from grasses, trees, and weeds. Year-round allergic rhinitis (perennial allergic rhinitis) is caused by allergens such as house dust mites, pet dander, and mold spores. SYMPTOMS 31. Nasal stuffiness (congestion). 32. Itchy, runny nose with sneezing and tearing of the eyes. DIAGNOSIS Your health care provider can help you determine the allergen or allergens that trigger your symptoms. If you and your health care provider are unable to determine the allergen, skin or blood testing may be used. Your health care provider will diagnose your condition after taking your health history and performing a physical exam. Your health care provider may assess you for other related conditions, such as asthma, pink eye, or an ear infection. TREATMENT Allergic rhinitis does not have a cure, but it can be controlled by:  Medicines that block allergy symptoms. These may include allergy shots, nasal sprays, and oral antihistamines.  Avoiding the allergen. Hay fever may often be treated with antihistamines in pill or nasal spray forms. Antihistamines block the effects of histamine. There are over-the-counter medicines that may help with nasal congestion and swelling around the eyes. Check with your health care provider before taking or giving this medicine. If  avoiding the allergen or the medicine prescribed do not work, there are many new medicines your health care provider can prescribe. Stronger medicine may be used if initial measures are ineffective. Desensitizing injections can be used if medicine and avoidance does not work. Desensitization is when a patient is given ongoing shots until the body becomes less sensitive to the allergen. Make sure you follow up with  your health care provider if problems continue. HOME CARE INSTRUCTIONS It is not possible to completely avoid allergens, but you can reduce your symptoms by taking steps to limit your exposure to them. It helps to know exactly what you are allergic to so that you can avoid your specific triggers. SEEK MEDICAL CARE IF:  You have a fever.  You develop a cough that does not stop easily (persistent).  You have shortness of breath.  You start wheezing.  Symptoms interfere with normal daily activities.   This information is not intended to replace advice given to you by your health care provider. Make sure you discuss any questions you have with your health care provider.   Document Released: 05/26/2001 Document Revised: 09/21/2014 Document Reviewed: 05/08/2013 Elsevier Interactive Patient Education 2016 Elsevier Inc. Sinusitis, Adult Sinusitis is redness, soreness, and inflammation of the paranasal sinuses. Paranasal sinuses are air pockets within the bones of your face. They are located beneath your eyes, in the middle of your forehead, and above your eyes. In healthy paranasal sinuses, mucus is able to drain out, and air is able to circulate through them by way of your nose. However, when your paranasal sinuses are inflamed, mucus and air can become trapped. This can allow bacteria and other germs to grow and cause infection. Sinusitis can develop quickly and last only a short time (acute) or continue over a long period (chronic). Sinusitis that lasts for more than 12 weeks is considered  chronic. CAUSES Causes of sinusitis include: 33. Allergies. 34. Structural abnormalities, such as displacement of the cartilage that separates your nostrils (deviated septum), which can decrease the air flow through your nose and sinuses and affect sinus drainage. 35. Functional abnormalities, such as when the small hairs (cilia) that line your sinuses and help remove mucus do not work properly or are not present. SIGNS AND SYMPTOMS Symptoms of acute and chronic sinusitis are the same. The primary symptoms are pain and pressure around the affected sinuses. Other symptoms include:  Upper toothache.  Earache.  Headache.  Bad breath.  Decreased sense of smell and taste.  A cough, which worsens when you are lying flat.  Fatigue.  Fever.  Thick drainage from your nose, which often is green and may contain pus (purulent).  Swelling and warmth over the affected sinuses. DIAGNOSIS Your health care provider will perform a physical exam. During your exam, your health care provider may perform any of the following to help determine if you have acute sinusitis or chronic sinusitis:  Look in your nose for signs of abnormal growths in your nostrils (nasal polyps).  Tap over the affected sinus to check for signs of infection.  View the inside of your sinuses using an imaging device that has a light attached (endoscope). If your health care provider suspects that you have chronic sinusitis, one or more of the following tests may be recommended:  Allergy tests.  Nasal culture. A sample of mucus is taken from your nose, sent to a lab, and screened for bacteria.  Nasal cytology. A sample of mucus is taken from your nose and examined by your health care provider to determine if your sinusitis is related to an allergy. TREATMENT Most cases of acute sinusitis are related to a viral infection and will resolve on their own within 10 days. Sometimes, medicines are prescribed to help relieve  symptoms of both acute and chronic sinusitis. These may include pain medicines, decongestants, nasal steroid sprays, or saline sprays. However, for sinusitis related  to a bacterial infection, your health care provider will prescribe antibiotic medicines. These are medicines that will help kill the bacteria causing the infection. Rarely, sinusitis is caused by a fungal infection. In these cases, your health care provider will prescribe antifungal medicine. For some cases of chronic sinusitis, surgery is needed. Generally, these are cases in which sinusitis recurs more than 3 times per year, despite other treatments. HOME CARE INSTRUCTIONS  Drink plenty of water. Water helps thin the mucus so your sinuses can drain more easily.  Use a humidifier.  Inhale steam 3-4 times a day (for example, sit in the bathroom with the shower running).  Apply a warm, moist washcloth to your face 3-4 times a day, or as directed by your health care provider.  Use saline nasal sprays to help moisten and clean your sinuses.  Take medicines only as directed by your health care provider.  If you were prescribed either an antibiotic or antifungal medicine, finish it all even if you start to feel better. SEEK IMMEDIATE MEDICAL CARE IF:  You have increasing pain or severe headaches.  You have nausea, vomiting, or drowsiness.  You have swelling around your face.  You have vision problems.  You have a stiff neck.  You have difficulty breathing.   This information is not intended to replace advice given to you by your health care provider. Make sure you discuss any questions you have with your health care provider.   Document Released: 08/31/2005 Document Revised: 09/21/2014 Document Reviewed: 09/15/2011 Elsevier Interactive Patient Education [employer].

## 2015-10-17 NOTE — ED Notes (Signed)
With H/O asthma. Dry cough. Also c/o sinus pressure, nasal congestion, chest tightness (no SOB or heart palpitations; pt is tachycardic but just had Duo-Neb treatment prior to coming to [facility]). Pt states she can not take "high doses" of Prednisone due to DM.

## 2016-02-12 ENCOUNTER — Ambulatory Visit: Payer: Commercial Indemnity | Attending: Orthopedic Surgery | Admitting: Physical Therapy

## 2016-02-12 ENCOUNTER — Encounter: Payer: Self-pay | Admitting: Physical Therapy

## 2016-02-12 DIAGNOSIS — M2560 Stiffness of unspecified joint, not elsewhere classified: Secondary | ICD-10-CM | POA: Diagnosis present

## 2016-02-12 DIAGNOSIS — M6281 Muscle weakness (generalized): Secondary | ICD-10-CM | POA: Insufficient documentation

## 2016-02-12 DIAGNOSIS — M545 Low back pain, unspecified: Secondary | ICD-10-CM

## 2016-02-12 NOTE — Therapy (Deleted)
[facility] [address] [place], [place], [address] Phone: [phone]   Fax:  [phone]  Physical Therapy Evaluation  Patient Details  Name: Joy Roberts MRN: [id number] Date of Birth: 24-Feb-1961 Referring Provider: Carlynn Spry  Encounter Date: 02/12/2016      PT End of Session - 02/12/16 2028    Visit Number 1   Number of Visits 8   Date for PT Re-Evaluation 03/11/16   PT Start Time [id number]   PT Stop Time 1719   PT Time Calculation (min) 58 min   Activity Tolerance Patient tolerated treatment well   Behavior During Therapy [facility] for tasks assessed/performed      Past Medical History  Diagnosis Date  . Kidney stones   . Asthma     Past Surgical History  Procedure Laterality Date  . Cesarean section      There were no vitals filed for this visit.           [facility] PT Assessment - 02/12/16 0001    Assessment   Medical Diagnosis Low back pain   Referring Provider Carlynn Spry   Onset Date/Surgical Date 09/15/15            PT Education - 02/12/16 2027    Education provided Yes   Education Details Core stability ex. program.  Page #1-3.    Person(s) Educated Patient   Methods Explanation;Demonstration;Handout   Comprehension Verbalized understanding;Returned demonstration            Plan - 02/12/16 2029    PT Frequency 2x / week   PT Duration 4 weeks   PT Treatment/Interventions ADLs/Self Care Home Management;Aquatic Therapy;Functional mobility training;Therapeutic activities;Electrical Stimulation;Therapeutic exercise;Balance training;Neuromuscular re-education;Patient/family education;Energy conservation;Passive range of motion;Manual techniques   PT Next Visit Plan Reassess HEP   PT Home Exercise Plan See handouts   Consulted and Agree with Plan of Care Patient      Patient will benefit from skilled therapeutic intervention in order to improve the following deficits and impairments:   Decreased strength, Improper body mechanics, Postural dysfunction, Decreased activity tolerance, Decreased mobility, Impaired flexibility, Obesity, Decreased range of motion, Pain  Visit Diagnosis: Midline low back pain without sciatica  Muscle weakness (generalized)  Joint stiffness of spine     Problem List There are no active problems to display for this patient.  Pura Spice, PT, DPT # [phone]   02/12/2016, 8:31 PM  [facility] [address] [place], [place], [address] Phone: [phone]   Fax:  [phone]  Name: Joy Roberts MRN: [id number] Date of Birth: 19-Apr-1961

## 2016-02-14 ENCOUNTER — Ambulatory Visit (INDEPENDENT_AMBULATORY_CARE_PROVIDER_SITE_OTHER): Payer: Commercial Indemnity

## 2016-02-14 ENCOUNTER — Ambulatory Visit
Admission: EM | Admit: 2016-02-14 | Discharge: 2016-02-14 | Disposition: A | Discharge status: Home / Self Care | Payer: Commercial Indemnity | Attending: Family Medicine | Admitting: Family Medicine

## 2016-02-14 DIAGNOSIS — M6248 Contracture of muscle, other site: Secondary | ICD-10-CM | POA: Diagnosis not present

## 2016-02-14 DIAGNOSIS — S46812A Strain of other muscles, fascia and tendons at shoulder and upper arm level, left arm, initial encounter: Secondary | ICD-10-CM

## 2016-02-14 DIAGNOSIS — M62838 Other muscle spasm: Secondary | ICD-10-CM

## 2016-02-14 HISTORY — DX: Essential (primary) hypertension: I10

## 2016-02-14 HISTORY — DX: Unspecified osteoarthritis, unspecified site: M19.90

## 2016-02-14 HISTORY — DX: Type 2 diabetes mellitus without complications: E11.9

## 2016-02-14 MED ORDER — ORPHENADRINE CITRATE ER 100 MG PO TB12: 100.0000 mg | ORAL_TABLET | Freq: Two times a day (BID) | ORAL | Status: DC

## 2016-02-14 MED ORDER — CELECOXIB 400 MG PO CAPS: 400.0000 mg | ORAL_CAPSULE | Freq: Every day | ORAL | Status: DC

## 2016-02-14 NOTE — ED Provider Notes (Signed)
CSN: [id number]     Arrival date & time 02/14/16  [id number] History   First MD Initiated Contact with Patient 02/14/16 [phone]    Nurses notes were reviewed. Chief Complaint  Patient presents with  . Shoulder Pain   Patient's here because of shoulder pain. She states that she was moving some heavy trays offered desk the head paper in it the last few days last night she was helping her husband clean air camper when she started having pain in the left shoulder and left neck. She is left-handed but states she was not using her left hand to do most of cleaning she uses use her right hand. She denies any injury or any insult to the left shoulder or left area. Past history she has history of hypertension and also diabetes she also has history of kidney stones and osteoarthritis of the lumbar spine. She recently had her meloxicam stopped and was placed back on her Lodine for her arthritis of the spine.  Previous surgeries C-section. Father's had a stroke and kidney failure. She never smoked  She is allergic to codeine and sulfur antibiotics.  (Consider location/radiation/quality/duration/timing/severity/associated sxs/prior Treatment) Patient is a 55 y.o. female presenting with shoulder pain. The history is provided by the patient. No language interpreter was used.  Shoulder Pain Location:  Shoulder Pain details:    Quality:  Aching   Radiates to:  Does not radiate   Onset quality:  Sudden   Timing:  Constant   Progression:  Worsening Chronicity:  New Handedness:  Left-handed Dislocation: no   Foreign body present:  No foreign bodies Prior injury to area:  No Relieved by:  Nothing Worsened by:  Nothing tried Ineffective treatments:  None tried Associated symptoms: neck pain   Associated symptoms: no decreased range of motion, no fever, no muscle weakness, no swelling and no tingling   Risk factors: no concern for non-accidental trauma and no frequent fractures     Past Medical History    Diagnosis Date  . Kidney stones   . Asthma   . Hypertension   . Diabetes ([place])   . Osteoarthritis    Past Surgical History  Procedure Laterality Date  . Cesarean section  2003   Family History  Problem Relation Age of Onset  . Stroke Father   . Kidney failure Father    Social History  Substance Use Topics  . Smoking status: Never Smoker   . Smokeless tobacco: Never Used  . Alcohol Use: No   OB History    No data available     Review of Systems  Constitutional: Negative for fever.  Musculoskeletal: Positive for myalgias, neck pain and neck stiffness.  Neurological: Negative for headaches.  All other systems reviewed and are negative.   Allergies  Codeine and Sulfa antibiotics  Home Medications   Prior to Admission medications   Medication Sig Start Date End Date Taking? Authorizing Provider  albuterol (PROVENTIL) (2.5 MG/3ML) 0.083% nebulizer solution Take 3 mLs (2.5 mg total) by nebulization every 6 (six) hours as needed for wheezing or shortness of breath. 08/31/15  Yes Sherlene Shams, MD  amLODipine-benazepril (LOTREL) 10-20 MG capsule Take 1 capsule by mouth daily.   Yes Historical Provider, MD  atorvastatin (LIPITOR) 10 MG tablet Take 10 mg by mouth daily.   Yes Historical Provider, MD  budesonide (PULMICORT) 0.5 MG/2ML nebulizer solution Take 2 mLs (0.5 mg total) by nebulization every 6 (six) hours as needed. 10/17/15 02/14/16 Yes Olen Cordial, NP  etodolac (LODINE) 500 MG tablet Take 500 mg by mouth 2 (two) times daily.   Yes Historical Provider, MD  hydrochlorothiazide (HYDRODIURIL) 25 MG tablet Take 25 mg by mouth daily.   Yes Historical Provider, MD  metFORMIN (GLUCOPHAGE) 500 MG tablet Take 1,000 mg by mouth 4 (four) times daily.    Yes Historical Provider, MD  sitaGLIPtin (JANUVIA) 50 MG tablet Take 50 mg by mouth daily.   Yes Historical Provider, MD  benzonatate (TESSALON) 200 MG capsule Take 1 capsule (200 mg total) by mouth 3 (three) times daily as  needed for cough. Patient not taking: Reported on 02/12/2016 08/31/15   Sherlene Shams, MD  celecoxib (CELEBREX) 400 MG capsule Take 1 capsule (400 mg total) by mouth daily after breakfast. 02/14/16   Frederich Cha, MD  fluticasone [facility]) 50 MCG/ACT nasal spray Place 1 spray into both nostrils 2 (two) times daily. Patient not taking: Reported on 02/12/2016 10/17/15   Olen Cordial, NP  loratadine (CLARITIN) 10 MG tablet Take 1 tablet (10 mg total) by mouth daily. Patient not taking: Reported on 02/12/2016 10/17/15   Olen Cordial, NP  meloxicam (MOBIC) 15 MG tablet Take 15 mg by mouth daily. Reported on 02/12/2016    Historical Provider, MD  orphenadrine (NORFLEX) 100 MG tablet Take 1 tablet (100 mg total) by mouth 2 (two) times daily. 02/14/16   Frederich Cha, MD  sodium chloride (OCEAN) 0.65 % SOLN nasal spray Place 2 sprays into both nostrils every 2 (two) hours while awake. Patient not taking: Reported on 02/12/2016 10/17/15   Olen Cordial, NP   Meds Ordered and Administered this Visit  Medications - No data to display  BP 103/63 mmHg  Pulse 91  Temp(Src) 97.6 F (36.4 C) (Tympanic)  Resp 15  Ht 5\' 5"  (1.651 m)  Wt 290 lb (131.543 kg)  BMI 48.26 kg/m2  SpO2 99%  LMP  No data found.   Physical Exam  Constitutional: She is oriented to person, place, and time. She appears well-developed and well-nourished.  HENT:  Head: Normocephalic and atraumatic.  Eyes: Pupils are equal, round, and reactive to light.  Neck: Normal range of motion. Neck supple.  Musculoskeletal: Normal range of motion. She exhibits tenderness.       Right shoulder: She exhibits normal range of motion, no tenderness and no pain.       Left shoulder: She exhibits tenderness and spasm.       Arms: Neurological: She is alert and oriented to person, place, and time.  Skin: Skin is warm. No erythema.  Psychiatric: She has a normal mood and affect.  Vitals reviewed.  Patient's able to raise the left arm above her  head she is also able to raise her left arm to 90 position resist pressure and either thumb down without any pain or discomfort.  ED Course  Procedures (including critical care time)  Labs Review Labs Reviewed - No data to display  Imaging Review Dg Cervical Spine Complete  02/14/2016  CLINICAL DATA:  Left posterior neck pain. Symptoms for 2 days. No apparent injury. EXAM: CERVICAL SPINE - COMPLETE 4+ VIEW COMPARISON:  None. FINDINGS: Alignment of the cervical spine is normal. Prevertebral soft tissues are normal. Disc spaces are maintained. Minimal bony encroachment of the left neural foramen at C4-C5. Bilateral facet arthropathy in the lower cervical spine. No evidence for a fracture. IMPRESSION: Mild degenerative changes without acute bone abnormality. Electronically Signed   By: Scherrie Gerlach.D.  On: 02/14/2016 11:47     Visual Acuity Review  Right Eye Distance:   Left Eye Distance:   Bilateral Distance:    Right Eye Near:   Left Eye Near:    Bilateral Near:         MDM   1. Muscle spasms of neck   2. Trapezius muscle strain, left, initial encounter    Using pressure the left trapezius muscle was squeezed for about a minute after that patient reports improvement of the muscle spasms in the left neck and left shoulder. With improved range of motion. This is all consistent with muscle spasm. Initially we talked about placing her on Norflex muscle relaxer 1 tablet twice a day stopping Lodine and trying her on Celebrex 400 mg which hopefully her insurance will pay for provide maximal analgesic. One other problem that we do have is that she is allergic to sulfur were discussed with her whether she wants to go ahead and try the Celebrex since it is not a  sulfur antibiotic. It does turn out that the sulfur allergy to GI intolerance and that she has been on Celebrex before.    Frederich Cha, MD 02/14/16 [phone]

## 2016-02-14 NOTE — Discharge Instructions (Signed)
Cryotherapy Cryotherapy is when you put ice on your injury. Ice helps lessen pain and puffiness (swelling) after an injury. Ice works the best when you start using it in the first 24 to 48 hours after an injury. HOME CARE  Put a dry or damp towel between the ice pack and your skin.  You may press gently on the ice pack.  Leave the ice on for no more than 10 to 20 minutes at a time.  Check your skin after 5 minutes to make sure your skin is okay.  Rest at least 20 minutes between ice pack uses.  Stop using ice when your skin loses feeling (numbness).  Do not use ice on someone who cannot tell you when it hurts. This includes small children and people with memory problems (dementia). GET HELP RIGHT AWAY IF:  You have white spots on your skin.  Your skin turns blue or pale.  Your skin feels waxy or hard.  Your puffiness gets worse. MAKE SURE YOU:   Understand these instructions.  Will watch your condition.  Will get help right away if you are not doing well or get worse.   This information is not intended to replace advice given to you by your health care provider. Make sure you discuss any questions you have with your health care provider.   Document Released: 02/17/2008 Document Revised: 11/23/2011 Document Reviewed: 04/23/2011 Elsevier Interactive Patient Education 2016 [place].  Muscle Strain A muscle strain (pulled muscle) happens when a muscle is stretched beyond normal length. It happens when a sudden, violent force stretches your muscle too far. Usually, a few of the fibers in your muscle are torn. Muscle strain is common in athletes. Recovery usually takes 1-2 weeks. Complete healing takes 5-6 weeks.  HOME CARE   Follow the PRICE method of treatment to help your injury get better. Do this the first 2-3 days after the injury:  Protect. Protect the muscle to keep it from getting injured again.  Rest. Limit your activity and rest the injured body part.  Ice.  Put ice in a plastic bag. Place a towel between your skin and the bag. Then, apply the ice and leave it on from 15-20 minutes each hour. After the third day, switch to moist heat packs.  Compression. Use a splint or elastic bandage on the injured area for comfort. Do not put it on too tightly.  Elevate. Keep the injured body part above the level of your heart.  Only take medicine as told by your doctor.  Warm up before doing exercise to prevent future muscle strains. GET HELP IF:   You have more pain or puffiness (swelling) in the injured area.  You feel numbness, tingling, or notice a loss of strength in the injured area. MAKE SURE YOU:   Understand these instructions.  Will watch your condition.  Will get help right away if you are not doing well or get worse.   This information is not intended to replace advice given to you by your health care provider. Make sure you discuss any questions you have with your health care provider.   Document Released: 06/09/2008 Document Revised: 06/21/2013 Document Reviewed: 03/30/2013 Elsevier Interactive Patient Education [employer].

## 2016-02-14 NOTE — ED Notes (Signed)
Patient complains of left shoulder pain that started yesterday. Patient states that she has been doing light lifting at work and cleaned her motor home last night. She states that she is having sharp pain in her left shoulder blade.

## 2016-02-16 NOTE — Therapy (Signed)
[facility] [address] [place], [place], [address] Phone: [phone]   Fax:  [phone]  Physical Therapy Evaluation  Patient Details  Name: Joy Roberts MRN: [id number] Date of Birth: 1960-11-30 Referring Provider: Carlynn Spry  Encounter Date: 02/12/2016    Past Medical History  Diagnosis Date  . Kidney stones   . Asthma   . Hypertension   . Diabetes ([place])   . Osteoarthritis     Past Surgical History  Procedure Laterality Date  . Cesarean section  2003    There were no vitals filed for this visit.       Subjective Assessment - 02/16/16 2018    Subjective Pt. currently reports no low back pain but >25/10 back pain while walking around [facility].     Limitations Lifting;Standing;Walking;House hold activities   Patient Stated Goals Increase core stability/ pain-free mobility.     Currently in Pain? No/denies      There.ex: Page #1-3 of Core stability program.  Cuing required for proper contraction of TrA.       Pt. is a pleasant 55 y/o female with chronic h/o back pain.  Pt. reports no pain currently at rest and severe >10/10 pain with prolonged walking, esp. during recent trip to [facility].  Pt. has difficulty with vacuuming, sweeping and climbing stairs due to back pain.  Pt. has been a prone sleeper in past but limited by abdominal hernia.  Lumbar AROM WFL and LE muscle strength grossly 4+/5 MMT.  Pt. currently not participating with HEP and will benefit from core stability/ generalized strengtheng ex. program.  Modified Oswestry: 42% self-perceived severe disability.         PT Long Term Goals - 02/13/16 2031    PT LONG TERM GOAL #1   Title Pt. independent with HEP to develop core stability ex. program to improve pain-free mobility/ return to housework.    Baseline pain limited with household chores.    Time 4   Period Weeks   Status New   PT LONG TERM GOAL #2   Title Pt. will decrease  Modified Oswestry to <20% to improve pain-free mobility.    Baseline Modified Oswestry: 42% on 5/31/   Time 4   Period Weeks   Status New   PT LONG TERM GOAL #3   Title Pt. able to complete 30 minutes of household chores with no increase c/o back pain.     Baseline limited with daily/ household tasks.    Time 4   Period Weeks   Status New   PT LONG TERM GOAL #4   Title Pt. able to complete shopping/ prolonged walking with no increase c/o low back pain.     Baseline pain limited with walking.    Time 4   Period Weeks   Status New             Patient will benefit from skilled therapeutic intervention in order to improve the following deficits and impairments:  Decreased strength, Improper body mechanics, Postural dysfunction, Decreased activity tolerance, Decreased mobility, Impaired flexibility, Obesity, Decreased range of motion, Pain  Visit Diagnosis: Midline low back pain without sciatica  Muscle weakness (generalized)  Joint stiffness of spine     Problem List There are no active problems to display for this patient.  Pura Spice, PT, DPT # [phone]   02/16/2016, 8:35 PM  [facility] [address] [place], [place], [address]  Phone: [phone]   Fax:  [phone]  Name: Joy Roberts MRN: [id number] Date of Birth: 01/06/1961

## 2016-02-16 NOTE — Addendum Note (Signed)
Addended by: Pura Spice on: 02/16/2016 08:41 PM   Modules accepted: Orders

## 2016-02-17 ENCOUNTER — Encounter: Payer: Commercial Indemnity | Admitting: Physical Therapy

## 2016-02-20 ENCOUNTER — Encounter: Payer: Commercial Indemnity | Admitting: Physical Therapy

## 2016-02-24 ENCOUNTER — Encounter: Payer: Commercial Indemnity | Admitting: Physical Therapy

## 2016-02-26 ENCOUNTER — Encounter: Payer: Commercial Indemnity | Admitting: Physical Therapy

## 2016-03-02 ENCOUNTER — Ambulatory Visit: Payer: Managed Care, Other (non HMO) | Attending: Orthopedic Surgery | Admitting: Physical Therapy

## 2016-03-02 ENCOUNTER — Encounter: Payer: Self-pay | Admitting: Physical Therapy

## 2016-03-02 DIAGNOSIS — M2560 Stiffness of unspecified joint, not elsewhere classified: Secondary | ICD-10-CM | POA: Insufficient documentation

## 2016-03-02 DIAGNOSIS — M545 Low back pain, unspecified: Secondary | ICD-10-CM

## 2016-03-02 DIAGNOSIS — M6281 Muscle weakness (generalized): Secondary | ICD-10-CM | POA: Insufficient documentation

## 2016-03-02 NOTE — Therapy (Signed)
[facility] [address] [place], [place], [address] Phone: [phone]   Fax:  [phone]  Physical Therapy Treatment  Patient Details  Name: Joy Roberts MRN: [id number] Date of Birth: 1961/03/03 Referring Provider: Carlynn Spry  Encounter Date: 03/02/2016      PT End of Session - 03/02/16 1747    Visit Number 2   Number of Visits 8   Date for PT Re-Evaluation 03/11/16   PT Start Time [id number]   PT Stop Time 1734   PT Time Calculation (min) 51 min   Activity Tolerance Patient tolerated treatment well   Behavior During Therapy [facility] for tasks assessed/performed      Past Medical History  Diagnosis Date  . Kidney stones   . Asthma   . Hypertension   . Diabetes ([place])   . Osteoarthritis     Past Surgical History  Procedure Laterality Date  . Cesarean section  2003    There were no vitals filed for this visit.      Subjective Assessment - 03/02/16 1745    Subjective Pt. states she injuried neck/UT region several days after PT initial evaluation while cleaning out RV.  Pt. has been put on Celebrex with marked improvement noted in neck/back.  Pt. will have occasional neck/ radicular pain with certain movement.  No c/o LBP at this time.  Pt. reports minmal compliance with core stability ex. program due to vacation at [facility].     Limitations Lifting;Standing;Walking;House hold activities   Patient Stated Goals Increase core stability/ pain-free mobility.     Currently in Pain? No/denies      OBJECTIVE:  There.ex.: Nustep L6 10 min. (warm-up/no charge)- no c/o UE or neck pain.  Reviewed TrA muscle contraction/ HEP progression in depth (no changes to program at this time).  Seated green ball: pelvic tilts/ clocks/ marching/ LAQ/ alt. UE and LE 10x2 each (mirror feedback and SBA to CGA for safety.   Manual tx.:  Supine hamstring/ piriformis stretches (limited by several sharp muscle cramps in anterior thighs).         Pt response for medical necessity:  Benefits from core stability ex. Program to promote pain-free mobility/ stability with daily tasks.  Pt. Reports no pain and has minimal to no tenderness in lumbar paraspinals.  Progressing well with TrA HEP.           PT Long Term Goals - 02/13/16 2031    PT LONG TERM GOAL #1   Title Pt. independent with HEP to develop core stability ex. program to improve pain-free mobility/ return to housework.    Baseline pain limited with household chores.    Time 4   Period Weeks   Status New   PT LONG TERM GOAL #2   Title Pt. will decrease Modified Oswestry to <20% to improve pain-free mobility.    Baseline Modified Oswestry: 42% on 5/31/   Time 4   Period Weeks   Status New   PT LONG TERM GOAL #3   Title Pt. able to complete 30 minutes of household chores with no increase c/o back pain.     Baseline limited with daily/ household tasks.    Time 4   Period Weeks   Status New   PT LONG TERM GOAL #4   Title Pt. able to complete shopping/ prolonged walking with no increase c/o low back pain.     Baseline pain limited with walking.    Time 4  Period Weeks   Status New               Plan - 03/02/16 1747    Clinical Impression Statement Good TrA muscle contraction with supine and progression to seated ball ex.  No tenderness noted in low back while in sitting posture.  Pt. remain unable to lie prone due to hernia.  Several episodes of upper thigh/ quad muscle cramping during SLR/ attempts to stretch hamstring musculature after tx. session.  Pts. cramping resolved quickly when attempts to stretch stopped.     Rehab Potential Good   PT Frequency 2x / week   PT Duration 4 weeks   PT Treatment/Interventions ADLs/Self Care Home Management;Aquatic Therapy;Functional mobility training;Therapeutic activities;Electrical Stimulation;Therapeutic exercise;Balance training;Neuromuscular re-education;Patient/family education;Energy conservation;Passive  range of motion;Manual techniques;Cryotherapy;Moist Heat   PT Next Visit Plan Progress HEP/ ball ex.    PT Home Exercise Plan See handouts   Consulted and Agree with Plan of Care Patient      Patient will benefit from skilled therapeutic intervention in order to improve the following deficits and impairments:  Decreased strength, Improper body mechanics, Postural dysfunction, Decreased activity tolerance, Decreased mobility, Impaired flexibility, Obesity, Decreased range of motion, Pain  Visit Diagnosis: Midline low back pain without sciatica  Muscle weakness (generalized)  Joint stiffness of spine     Problem List There are no active problems to display for this patient.  Pura Spice, PT, DPT # [phone]   03/02/2016, 5:50 PM  [facility] [address] [place], [place], [address] Phone: [phone]   Fax:  [phone]  Name: Joy Roberts MRN: [id number] Date of Birth: [birth year]

## 2016-03-04 ENCOUNTER — Ambulatory Visit: Payer: Managed Care, Other (non HMO) | Admitting: Physical Therapy

## 2016-03-04 DIAGNOSIS — M2560 Stiffness of unspecified joint, not elsewhere classified: Secondary | ICD-10-CM

## 2016-03-04 DIAGNOSIS — M545 Low back pain, unspecified: Secondary | ICD-10-CM

## 2016-03-04 DIAGNOSIS — M6281 Muscle weakness (generalized): Secondary | ICD-10-CM

## 2016-03-04 NOTE — Therapy (Signed)
[name] [address] [place], [place], [address] Phone: [phone]   Fax:  [phone]  Physical Therapy Treatment  Patient Details  Name: Joy Roberts MRN: [id number] Date of Birth: February 23, 1961 Referring Provider: Carlynn Spry  Encounter Date: 03/04/2016      PT End of Session - 03/05/16 1855    Visit Number 3   Number of Visits 8   Date for PT Re-Evaluation 03/11/16   PT Start Time 1618   PT Stop Time 1706   PT Time Calculation (min) 48 min   Activity Tolerance Patient tolerated treatment well   Behavior During Therapy [facility] for tasks assessed/performed      Past Medical History  Diagnosis Date  . Kidney stones   . Asthma   . Hypertension   . Diabetes ([name])   . Osteoarthritis     Past Surgical History  Procedure Laterality Date  . Cesarean section  2003    There were no vitals filed for this visit.      Subjective Assessment - 03/05/16 1852    Subjective Pt. states her back has continued to improve since starting Celebrex.  No c/o LBP at this time.  Pt. states she has attempted HEP/core stability ex. program since last PT tx. session.     Limitations Lifting;Standing;Walking;House hold activities   Patient Stated Goals Increase core stability/ pain-free mobility.     Currently in Pain? No/denies        OBJECTIVE: There.ex.: Nustep L7 10 min. (warm-up/no charge)- no c/o UE or neck pain.  Progressed TrA muscle contraction/ HEP progression with increase hold time. Seated green ball: pelvic tilts/ clocks/ marching/ LAQ/ alt. UE and LE 10x2 each (mirror feedback and SBA for safety/ cuing). Manual tx.: Supine hamstring/ piriformis stretches (limited by several sharp muscle cramps in anterior thighs).  R sidelying lumbar STM to B paraspinals.      Pt response for medical necessity: Benefits from core stability ex. Program to promote pain-free mobility/ stability with daily tasks. Pt.  Reports no pain and has minimal to no tenderness in lumbar paraspinals. Progressing well with TrA HEP.           PT Long Term Goals - 02/13/16 2031    PT LONG TERM GOAL #1   Title Pt. independent with HEP to develop core stability ex. program to improve pain-free mobility/ return to housework.    Baseline pain limited with household chores.    Time 4   Period Weeks   Status New   PT LONG TERM GOAL #2   Title Pt. will decrease Modified Oswestry to <20% to improve pain-free mobility.    Baseline Modified Oswestry: 42% on 5/31/   Time 4   Period Weeks   Status New   PT LONG TERM GOAL #3   Title Pt. able to complete 30 minutes of household chores with no increase c/o back pain.     Baseline limited with daily/ household tasks.    Time 4   Period Weeks   Status New   PT LONG TERM GOAL #4   Title Pt. able to complete shopping/ prolonged walking with no increase c/o low back pain.     Baseline pain limited with walking.    Time 4   Period Weeks   Status New            Plan - 03/05/16 1856    Clinical Impression Statement PT unable to perform proper hamstring/ hip/ lumbar  stretching due to significant cramping in L distal hamstring/ posterior aspects of knee.  Pt. able to complete core stability ex. program in supine position with proper TrA contraction.  No lumbar or hip tendernes but moderate distal hamstring tenderness.     Rehab Potential Good   PT Frequency 2x / week   PT Duration 4 weeks   PT Treatment/Interventions ADLs/Self Care Home Management;Aquatic Therapy;Functional mobility training;Therapeutic activities;Electrical Stimulation;Therapeutic exercise;Balance training;Neuromuscular re-education;Patient/family education;Energy conservation;Passive range of motion;Manual techniques;Cryotherapy;Moist Heat   PT Next Visit Plan Issue ball ex./ reassess muscle cramping.     PT Home Exercise Plan See handouts      Patient will benefit from skilled therapeutic  intervention in order to improve the following deficits and impairments:  Decreased strength, Improper body mechanics, Postural dysfunction, Decreased activity tolerance, Decreased mobility, Impaired flexibility, Obesity, Decreased range of motion, Pain  Visit Diagnosis: Midline low back pain without sciatica  Muscle weakness (generalized)  Joint stiffness of spine     Problem List There are no active problems to display for this patient.  Pura Spice, PT, DPT # [phone]   03/05/2016, 7:11 PM  [facility] [address] [place], [place], [address] Phone: [phone]   Fax:  [phone]  Name: Joy Roberts MRN: [id number] Date of Birth: 31-May-1961

## 2016-03-05 ENCOUNTER — Encounter: Payer: Self-pay | Admitting: Physical Therapy

## 2016-03-11 ENCOUNTER — Ambulatory Visit: Payer: Managed Care, Other (non HMO) | Admitting: Physical Therapy

## 2016-03-31 DIAGNOSIS — E119 Type 2 diabetes mellitus without complications: Secondary | ICD-10-CM | POA: Insufficient documentation

## 2016-03-31 DIAGNOSIS — M170 Bilateral primary osteoarthritis of knee: Secondary | ICD-10-CM | POA: Insufficient documentation

## 2016-03-31 DIAGNOSIS — E118 Type 2 diabetes mellitus with unspecified complications: Secondary | ICD-10-CM | POA: Insufficient documentation

## 2016-03-31 DIAGNOSIS — M159 Polyosteoarthritis, unspecified: Secondary | ICD-10-CM | POA: Insufficient documentation

## 2016-04-09 ENCOUNTER — Emergency Department: Payer: Managed Care, Other (non HMO)

## 2016-04-09 ENCOUNTER — Emergency Department
Admission: EM | Admit: 2016-04-09 | Discharge: 2016-04-09 | Disposition: A | Discharge status: Home / Self Care | Payer: Managed Care, Other (non HMO) | Attending: Emergency Medicine | Admitting: Emergency Medicine

## 2016-04-09 DIAGNOSIS — E119 Type 2 diabetes mellitus without complications: Secondary | ICD-10-CM | POA: Diagnosis not present

## 2016-04-09 DIAGNOSIS — N200 Calculus of kidney: Secondary | ICD-10-CM | POA: Insufficient documentation

## 2016-04-09 DIAGNOSIS — R109 Unspecified abdominal pain: Secondary | ICD-10-CM | POA: Diagnosis present

## 2016-04-09 DIAGNOSIS — J45909 Unspecified asthma, uncomplicated: Secondary | ICD-10-CM | POA: Diagnosis not present

## 2016-04-09 DIAGNOSIS — Z7984 Long term (current) use of oral hypoglycemic drugs: Secondary | ICD-10-CM | POA: Insufficient documentation

## 2016-04-09 DIAGNOSIS — I10 Essential (primary) hypertension: Secondary | ICD-10-CM | POA: Insufficient documentation

## 2016-04-09 DIAGNOSIS — Z79899 Other long term (current) drug therapy: Secondary | ICD-10-CM | POA: Diagnosis not present

## 2016-04-09 LAB — URINALYSIS COMPLETE WITH MICROSCOPIC (ARMC ONLY)
Glucose, UA: NEGATIVE mg/dL
KETONES UR: NEGATIVE mg/dL
Leukocytes, UA: NEGATIVE
NITRITE: NEGATIVE
PH: 5 (ref 5.0–8.0)
PROTEIN: 30 mg/dL — AB
SPECIFIC GRAVITY, URINE: 1.018 (ref 1.005–1.030)

## 2016-04-09 MED ORDER — KETOROLAC TROMETHAMINE 60 MG/2ML IM SOLN
60.0000 mg | Freq: Once | INTRAMUSCULAR | Status: AC
  Administered 2016-04-09: 60 mg via INTRAMUSCULAR

## 2016-04-09 MED ORDER — TRAMADOL HCL 50 MG PO TABS: 50.0000 mg | ORAL_TABLET | Freq: Four times a day (QID) | ORAL | 0 refills | Status: DC | PRN

## 2016-04-09 MED ORDER — KETOROLAC TROMETHAMINE 60 MG/2ML IM SOLN
INTRAMUSCULAR | Status: AC
  Filled 2016-04-09: qty 2

## 2016-04-09 MED ORDER — KETOROLAC TROMETHAMINE 10 MG PO TABS: 10.0000 mg | ORAL_TABLET | Freq: Four times a day (QID) | ORAL | 0 refills | Status: DC | PRN

## 2016-04-09 MED ORDER — TAMSULOSIN HCL 0.4 MG PO CAPS: 0.4000 mg | ORAL_CAPSULE | Freq: Every day | ORAL | 0 refills | Status: DC

## 2016-04-09 NOTE — ED Triage Notes (Signed)
Pt comes in c/o right flank pain that started as a dull ache this morning and has gotten progressively worse throughout the day. Pt was seen last year in June and tol that she had several kidney stones bilaterally, had LEFT lithotripsy, however she was told she had a large kidney stone on the RIGHT side. Pt has not had any urinary symptoms today. Pt visibly uncomfortable.

## 2016-04-09 NOTE — ED Notes (Signed)
Discharge instructions reviewed with patient. Patient verbalized understanding. Patient ambulated to lobby without difficulty.   

## 2016-04-09 NOTE — ED Provider Notes (Signed)
[facility] Emergency Department Provider Note  ____________________________________________  Time seen: Approximately 9:05 PM  I have reviewed the triage vital signs and the nursing notes.   HISTORY  Chief Complaint Flank Pain    HPI Joy Roberts is a 55 y.o. female who presents emergency department complaining of right flank pain. Patient states that she has a history of recurring kidney stones and believes that she has a another one. Patient reports sharp pain radiating from flank to groin region. Patientdenies any frank blood in her urine. Patient denies any dysuria or polyuria. Patient denies any fevers or chills, chest pain, shortness of breath, nausea or vomiting. Patient has taken one Toradol tablet that she had left over from previous kidney stone. Patient reports that this mildly abated symptoms. Complete this time.   Past Medical History:  Diagnosis Date  . Asthma   . Diabetes ([place])   . Hypertension   . Kidney stones   . Osteoarthritis     There are no active problems to display for this patient.   Past Surgical History:  Procedure Laterality Date  . CESAREAN SECTION  2003    Prior to Admission medications   Medication Sig Start Date End Date Taking? Authorizing Provider  albuterol (PROVENTIL) (2.5 MG/3ML) 0.083% nebulizer solution Take 3 mLs (2.5 mg total) by nebulization every 6 (six) hours as needed for wheezing or shortness of breath. 08/31/15   Sherlene Shams, MD  amLODipine-benazepril (LOTREL) 10-20 MG capsule Take 1 capsule by mouth daily.    Historical Provider, MD  atorvastatin (LIPITOR) 10 MG tablet Take 10 mg by mouth daily.    Historical Provider, MD  benzonatate (TESSALON) 200 MG capsule Take 1 capsule (200 mg total) by mouth 3 (three) times daily as needed for cough. Patient not taking: Reported on 02/12/2016 08/31/15   Sherlene Shams, MD  budesonide (PULMICORT) 0.5 MG/2ML nebulizer solution Take 2 mLs (0.5 mg total) by  nebulization every 6 (six) hours as needed. 10/17/15 02/14/16  Olen Cordial, NP  celecoxib (CELEBREX) 400 MG capsule Take 1 capsule (400 mg total) by mouth daily after breakfast. 02/14/16   Frederich Cha, MD  etodolac (LODINE) 500 MG tablet Take 500 mg by mouth 2 (two) times daily.    Historical Provider, MD  fluticasone (FLONASE) 50 MCG/ACT nasal spray Place 1 spray into both nostrils 2 (two) times daily. Patient not taking: Reported on 02/12/2016 10/17/15   Olen Cordial, NP  hydrochlorothiazide (HYDRODIURIL) 25 MG tablet Take 25 mg by mouth daily.    Historical Provider, MD  ketorolac (TORADOL) 10 MG tablet Take 1 tablet (10 mg total) by mouth every 6 (six) hours as needed. 04/09/16   Charline Bills Cuthriell, PA-C  loratadine (CLARITIN) 10 MG tablet Take 1 tablet (10 mg total) by mouth daily. Patient not taking: Reported on 02/12/2016 10/17/15   Olen Cordial, NP  meloxicam (MOBIC) 15 MG tablet Take 15 mg by mouth daily. Reported on 02/12/2016    Historical Provider, MD  metFORMIN (GLUCOPHAGE) 500 MG tablet Take 1,000 mg by mouth 4 (four) times daily.     Historical Provider, MD  orphenadrine (NORFLEX) 100 MG tablet Take 1 tablet (100 mg total) by mouth 2 (two) times daily. 02/14/16   Frederich Cha, MD  sitaGLIPtin (JANUVIA) 50 MG tablet Take 50 mg by mouth daily.    Historical Provider, MD  sodium chloride (OCEAN) 0.65 % SOLN nasal spray Place 2 sprays into both nostrils every 2 (two) hours  while awake. Patient not taking: Reported on 02/12/2016 10/17/15   Olen Cordial, NP  tamsulosin (FLOMAX) 0.4 MG CAPS capsule Take 1 capsule (0.4 mg total) by mouth daily. 04/09/16   Charline Bills Cuthriell, PA-C  traMADol (ULTRAM) 50 MG tablet Take 1 tablet (50 mg total) by mouth every 6 (six) hours as needed. 04/09/16   Charline Bills Cuthriell, PA-C    Allergies Codeine and Sulfa antibiotics  Family History  Problem Relation Age of Onset  . Stroke Father   . Kidney failure Father     Social History Social  History  Substance Use Topics  . Smoking status: Never Smoker  . Smokeless tobacco: Never Used  . Alcohol use No     Review of Systems  Constitutional: No fever/chills Cardiovascular: no chest pain. Respiratory: no cough. No SOB. Gastrointestinal: No abdominal pain.  No nausea, no vomiting.  No diarrhea.  No constipation. Genitourinary: Negative for dysuria. No hematuria. Flank pain right side radiating into groin. Musculoskeletal: Negative for musculoskeletal pain. Skin: Negative for rash, abrasions, lacerations, ecchymosis. Neurological: Negative for headaches, focal weakness or numbness. 10-point ROS otherwise negative.  ____________________________________________   PHYSICAL EXAM:  VITAL SIGNS: ED Triage Vitals  Enc Vitals Group     BP 04/09/16 2041 113/74     Pulse Rate 04/09/16 2041 92     Resp 04/09/16 2041 20     Temp 04/09/16 2041 98 F (36.7 C)     Temp Source 04/09/16 2041 Oral     SpO2 04/09/16 2041 98 %     Weight 04/09/16 2041 285 lb (129.3 kg)     Height 04/09/16 2041 5\' 5"  (1.651 m)     Head Circumference --      Peak Flow --      Pain Score 04/09/16 2042 7     Pain Loc --      Pain Edu? --      Excl. in [place]? --      Constitutional: Alert and oriented. Well appearing and in no acute distress. Eyes: Conjunctivae are normal. PERRL. EOMI. Head: Atraumatic. Cardiovascular: Normal rate, regular rhythm. Normal S1 and S2.  Good peripheral circulation. Respiratory: Normal respiratory effort without tachypnea or retractions. Lungs CTAB. Good air entry to the bases with no decreased or absent breath sounds. Gastrointestinal: Bowel sounds 4 quadrants. Soft and nontender to palpation. No guarding or rigidity. No palpable masses. No distention. No CVA tenderness. Musculoskeletal: Full range of motion to all extremities. No gross deformities appreciated. Neurologic:  Normal speech and language. No gross focal neurologic deficits are appreciated.  Skin:  Skin is  warm, dry and intact. No rash noted. Psychiatric: Mood and affect are normal. Speech and behavior are normal. Patient exhibits appropriate insight and judgement.   ____________________________________________   LABS (all labs ordered are listed, but only abnormal results are displayed)  Labs Reviewed  URINALYSIS COMPLETEWITH MICROSCOPIC ([facility] ONLY) - Abnormal; Notable for the following:       Result Value   Color, Urine YELLOW (*)    APPearance CLOUDY (*)    Bilirubin Urine 2+ (*)    Hgb urine dipstick 3+ (*)    Protein, ur 30 (*)    Bacteria, UA RARE (*)    Squamous Epithelial / LPF 6-30 (*)    All other components within normal limits   ____________________________________________  EKG   ____________________________________________  RADIOLOGY Diamantina Providence Cuthriell, personally viewed and evaluated these images (plain radiographs) as part of my medical decision making,  as well as reviewing the written report by the radiologist.  Ct Renal Stone Study  Result Date: 04/09/2016 CLINICAL DATA:  Right flank pain.  Known kidney stones. EXAM: CT ABDOMEN AND PELVIS WITHOUT CONTRAST TECHNIQUE: Multidetector CT imaging of the abdomen and pelvis was performed following the standard protocol without IV contrast. COMPARISON:  CT scan dated 03/06/2015 FINDINGS: Lower chest:  Normal. Hepatobiliary: The liver is normal. Multiple stones in the gallbladder. No dilated bile ducts. Gallbladder wall is not thickened. Pancreas: Normal. Spleen: Normal. Adrenals/Urinary Tract: Adrenal glands are normal. 6 mm stone in the lower pole the left kidney, unchanged. Multiple right lower pole stones as well as a 13 mm stone in the right renal pelvis. There is slight new soft tissue stranding around the lower pole of the right kidney with slight dilatation of portions of the right ureter but there is no visible right ureteral stone or bladder stone. No left hydronephrosis. Stomach/Bowel: There are scattered  diverticula in the distal colon. Otherwise normal including the terminal ileum and appendix. Vascular/Lymphatic: Aortic atherosclerosis. No pathologically enlarged lymph nodes. No evidence of abdominal aortic aneurysm. Reproductive: Normal. Other: No free air or free fluid. Musculoskeletal: Degenerative disc disease at L5-S1. No acute abnormalities. IMPRESSION: Bilateral renal calculi. Slight dilatation of the right ureter to the level of the bladder. No visible ureteral calculi or bladder stones. I suspect the patient may have recently passed a stone. It Electronically Signed   By: Lorriane Shire M.D.   On: 04/09/2016 21:32   ____________________________________________    PROCEDURES  Procedure(s) performed:    Procedures    Medications  ketorolac (TORADOL) injection 60 mg (60 mg Intramuscular Given 04/09/16 2149)     ____________________________________________   INITIAL IMPRESSION / ASSESSMENT AND PLAN / ED COURSE  Pertinent labs & imaging results that were available during my care of the patient were reviewed by me and considered in my medical decision making (see chart for details).  Clinical Course    Patient's diagnosis is consistent with Kidney stone to right side. Exam is reassuring. Patient had a CT scan which reveals multiple stones to right kidney and single stone to left kidney. Right ureter is enlarged consistent with passage of kidney stone. Patient reports improvement in symptoms here in the emergency department. After CT results no obstructing calculi patient is given injection of Toradol emergency department.. Patient will be discharged home with prescriptions for Toradol tablets, tramadol, Flomax. Patient is to follow up with urology as needed or otherwise directed. Patient is given ED precautions to return to the ED for any worsening or new symptoms.     ____________________________________________  FINAL CLINICAL IMPRESSION(S) / ED DIAGNOSES  Final  diagnoses:  Kidney stone      NEW MEDICATIONS STARTED DURING THIS VISIT:  Discharge Medication List as of 04/09/2016  9:49 PM    START taking these medications   Details  ketorolac (TORADOL) 10 MG tablet Take 1 tablet (10 mg total) by mouth every 6 (six) hours as needed., Starting Thu 04/09/2016, Print    tamsulosin (FLOMAX) 0.4 MG CAPS capsule Take 1 capsule (0.4 mg total) by mouth daily., Starting Thu 04/09/2016, Print    traMADol (ULTRAM) 50 MG tablet Take 1 tablet (50 mg total) by mouth every 6 (six) hours as needed., Starting Thu 04/09/2016, Print            This chart was dictated using voice recognition software/Dragon. Despite best efforts to proofread, errors can occur which can change the meaning. Any  change was purely unintentional.    Darletta Moll, PA-C 04/09/16 2328    Harvest Dark, MD 04/09/16 [phone]

## 2016-06-25 ENCOUNTER — Encounter: Payer: Self-pay | Admitting: Emergency Medicine

## 2016-06-25 ENCOUNTER — Emergency Department: Payer: Managed Care, Other (non HMO)

## 2016-06-25 ENCOUNTER — Emergency Department
Admission: EM | Admit: 2016-06-25 | Discharge: 2016-06-25 | Disposition: A | Discharge status: Home / Self Care | Payer: Managed Care, Other (non HMO) | Attending: Emergency Medicine | Admitting: Emergency Medicine

## 2016-06-25 DIAGNOSIS — I10 Essential (primary) hypertension: Secondary | ICD-10-CM | POA: Diagnosis not present

## 2016-06-25 DIAGNOSIS — N200 Calculus of kidney: Secondary | ICD-10-CM | POA: Diagnosis not present

## 2016-06-25 DIAGNOSIS — J45909 Unspecified asthma, uncomplicated: Secondary | ICD-10-CM | POA: Insufficient documentation

## 2016-06-25 DIAGNOSIS — E119 Type 2 diabetes mellitus without complications: Secondary | ICD-10-CM | POA: Insufficient documentation

## 2016-06-25 DIAGNOSIS — Z7984 Long term (current) use of oral hypoglycemic drugs: Secondary | ICD-10-CM | POA: Diagnosis not present

## 2016-06-25 DIAGNOSIS — R109 Unspecified abdominal pain: Secondary | ICD-10-CM | POA: Diagnosis present

## 2016-06-25 DIAGNOSIS — Z79899 Other long term (current) drug therapy: Secondary | ICD-10-CM | POA: Insufficient documentation

## 2016-06-25 LAB — COMPREHENSIVE METABOLIC PANEL
ALBUMIN: 3.7 g/dL (ref 3.5–5.0)
ALT: 34 U/L (ref 14–54)
AST: 28 U/L (ref 15–41)
Alkaline Phosphatase: 84 U/L (ref 38–126)
Anion gap: 8 (ref 5–15)
BUN: 22 mg/dL — ABNORMAL HIGH (ref 6–20)
CHLORIDE: 101 mmol/L (ref 101–111)
CO2: 26 mmol/L (ref 22–32)
CREATININE: 1.06 mg/dL — AB (ref 0.44–1.00)
Calcium: 8.9 mg/dL (ref 8.9–10.3)
GFR calc non Af Amer: 58 mL/min — ABNORMAL LOW (ref >60)
GLUCOSE: 228 mg/dL — AB (ref 65–99)
Potassium: 3.9 mmol/L (ref 3.5–5.1)
SODIUM: 135 mmol/L (ref 135–145)
Total Bilirubin: 0.5 mg/dL (ref 0.3–1.2)
Total Protein: 7.7 g/dL (ref 6.5–8.1)

## 2016-06-25 LAB — CBC WITH DIFFERENTIAL/PLATELET
BASOS ABS: 0 10*3/uL (ref 0–0.1)
BASOS PCT: 0 %
EOS ABS: 0.3 10*3/uL (ref 0–0.7)
EOS PCT: 3 %
HCT: 39.2 % (ref 35.0–47.0)
Hemoglobin: 12.9 g/dL (ref 12.0–16.0)
LYMPHS ABS: 1.9 10*3/uL (ref 1.0–3.6)
Lymphocytes Relative: 18 %
MCH: 28 pg (ref 26.0–34.0)
MCHC: 32.8 g/dL (ref 32.0–36.0)
MCV: 85.4 fL (ref 80.0–100.0)
Monocytes Absolute: 0.8 10*3/uL (ref 0.2–0.9)
Monocytes Relative: 8 %
NEUTROS PCT: 71 %
Neutro Abs: 7.2 10*3/uL — ABNORMAL HIGH (ref 1.4–6.5)
PLATELETS: 336 10*3/uL (ref 150–440)
RBC: 4.59 MIL/uL (ref 3.80–5.20)
RDW: 13.9 % (ref 11.5–14.5)
WBC: 10.3 10*3/uL (ref 3.6–11.0)

## 2016-06-25 LAB — URINALYSIS COMPLETE WITH MICROSCOPIC (ARMC ONLY)
BACTERIA UA: NONE SEEN
Bilirubin Urine: NEGATIVE
Glucose, UA: >500 mg/dL — AB
LEUKOCYTES UA: NEGATIVE
NITRITE: NEGATIVE
PH: 5 (ref 5.0–8.0)
PROTEIN: NEGATIVE mg/dL
SPECIFIC GRAVITY, URINE: 1.016 (ref 1.005–1.030)

## 2016-06-25 MED ORDER — MORPHINE SULFATE (PF) 4 MG/ML IV SOLN
4.0000 mg | Freq: Once | INTRAVENOUS | Status: AC
  Administered 2016-06-25: 4 mg via INTRAVENOUS
  Filled 2016-06-25: qty 1

## 2016-06-25 MED ORDER — ONDANSETRON 4 MG PO TBDP: 4.0000 mg | ORAL_TABLET | Freq: Three times a day (TID) | ORAL | 0 refills | Status: DC | PRN

## 2016-06-25 MED ORDER — OXYCODONE-ACETAMINOPHEN 5-325 MG PO TABS: 1.0000 | ORAL_TABLET | ORAL | 0 refills | Status: DC | PRN

## 2016-06-25 MED ORDER — ONDANSETRON HCL 4 MG/2ML IJ SOLN
4.0000 mg | Freq: Once | INTRAMUSCULAR | Status: AC
  Administered 2016-06-25: 4 mg via INTRAVENOUS
  Filled 2016-06-25: qty 2

## 2016-06-25 MED ORDER — OXYCODONE-ACETAMINOPHEN 5-325 MG PO TABS
1.0000 | ORAL_TABLET | Freq: Once | ORAL | Status: AC
  Administered 2016-06-25: 1 via ORAL
  Filled 2016-06-25: qty 1

## 2016-06-25 MED ORDER — SODIUM CHLORIDE 0.9 % IV BOLUS (SEPSIS)
1000.0000 mL | Freq: Once | INTRAVENOUS | Status: AC
  Administered 2016-06-25: 1000 mL via INTRAVENOUS

## 2016-06-25 NOTE — ED Notes (Signed)
Pt presents to ED with c/o right back pain radiating to RLQ and groin area since yesterday. Pt reports took tramadol at 11 pm last night with some relief, pt reports pain returned midnight. Pt has hx of kidney stones, states was told has 13 mm in right kidney and 6 mm in left kidney. Pt denies nausea or vomiting, pt appears in discomfort, unable to sit still, ambulating in room. Pt alert and oriented, family at bedside, call bell within reach.

## 2016-06-25 NOTE — ED Notes (Signed)
MD at bedside. 

## 2016-06-25 NOTE — ED Notes (Signed)
Patient transported to CT 

## 2016-06-25 NOTE — ED Notes (Signed)
Pt unable to provide urine specimen at this time

## 2016-06-25 NOTE — ED Notes (Signed)
Pt reports unable to provide urine specimen at this time, pt encouraged to attempt to provide urine specimen, pt reports "in a little bit I will try." MD notified.

## 2016-06-25 NOTE — ED Triage Notes (Signed)
Patient ambulatory to triage with steady gait, without difficulty, appears uncomfortable; c/o right flank pain radiating into abd/groin since last night; st hx kidney stones

## 2016-06-25 NOTE — ED Provider Notes (Signed)
[facility] Emergency Department Provider Note  ____________________________________________  Time seen: Approximately 3:51 AM  I have reviewed the triage vital signs and the nursing notes.   HISTORY  Chief Complaint Flank Pain   HPI Joy Roberts is a 55 y.o. female with a history of asthma, diabetes, hypertension, kidney stones who presents for evaluation of right flank pain. Patient reports the pain started earlier this morning and he feels like her prior kidney stones. She reports initially the pain was not severe but throughout the day it has become extremely severe. She took by mouth Toradol at home with minimal relief. She denies nausea or vomiting, chills or fever, hematuria, dysuria, frequency, abdominal pain. She currently endorses 10 out of 10 sharp pain located in her right flank radiating to her right groin that has been constant. She has undergone lithotripsy in the past but no other procedures.  Past Medical History:  Diagnosis Date  . Asthma   . Diabetes ([place])   . Hypertension   . Kidney stones   . Osteoarthritis     There are no active problems to display for this patient.   Past Surgical History:  Procedure Laterality Date  . CESAREAN SECTION  2003    Prior to Admission medications   Medication Sig Start Date End Date Taking? Authorizing Provider  albuterol (PROVENTIL) (2.5 MG/3ML) 0.083% nebulizer solution Take 3 mLs (2.5 mg total) by nebulization every 6 (six) hours as needed for wheezing or shortness of breath. 08/31/15   Sherlene Shams, MD  amLODipine-benazepril (LOTREL) 10-20 MG capsule Take 1 capsule by mouth daily.    Historical Provider, MD  atorvastatin (LIPITOR) 10 MG tablet Take 10 mg by mouth daily.    Historical Provider, MD  benzonatate (TESSALON) 200 MG capsule Take 1 capsule (200 mg total) by mouth 3 (three) times daily as needed for cough. Patient not taking: Reported on 02/12/2016 08/31/15   Sherlene Shams, MD    budesonide (PULMICORT) 0.5 MG/2ML nebulizer solution Take 2 mLs (0.5 mg total) by nebulization every 6 (six) hours as needed. 10/17/15 02/14/16  Olen Cordial, NP  celecoxib (CELEBREX) 400 MG capsule Take 1 capsule (400 mg total) by mouth daily after breakfast. 02/14/16   Frederich Cha, MD  etodolac (LODINE) 500 MG tablet Take 500 mg by mouth 2 (two) times daily.    Historical Provider, MD  fluticasone (FLONASE) 50 MCG/ACT nasal spray Place 1 spray into both nostrils 2 (two) times daily. Patient not taking: Reported on 02/12/2016 10/17/15   Olen Cordial, NP  hydrochlorothiazide (HYDRODIURIL) 25 MG tablet Take 25 mg by mouth daily.    Historical Provider, MD  ketorolac (TORADOL) 10 MG tablet Take 1 tablet (10 mg total) by mouth every 6 (six) hours as needed. 04/09/16   Charline Bills Cuthriell, PA-C  loratadine (CLARITIN) 10 MG tablet Take 1 tablet (10 mg total) by mouth daily. Patient not taking: Reported on 02/12/2016 10/17/15   Olen Cordial, NP  meloxicam (MOBIC) 15 MG tablet Take 15 mg by mouth daily. Reported on 02/12/2016    Historical Provider, MD  metFORMIN (GLUCOPHAGE) 500 MG tablet Take 1,000 mg by mouth 4 (four) times daily.     Historical Provider, MD  orphenadrine (NORFLEX) 100 MG tablet Take 1 tablet (100 mg total) by mouth 2 (two) times daily. 02/14/16   Frederich Cha, MD  sitaGLIPtin (JANUVIA) 50 MG tablet Take 50 mg by mouth daily.    Historical Provider, MD  sodium  chloride (OCEAN) 0.65 % SOLN nasal spray Place 2 sprays into both nostrils every 2 (two) hours while awake. Patient not taking: Reported on 02/12/2016 10/17/15   Olen Cordial, NP  tamsulosin (FLOMAX) 0.4 MG CAPS capsule Take 1 capsule (0.4 mg total) by mouth daily. 04/09/16   Charline Bills Cuthriell, PA-C  traMADol (ULTRAM) 50 MG tablet Take 1 tablet (50 mg total) by mouth every 6 (six) hours as needed. 04/09/16   Charline Bills Cuthriell, PA-C    Allergies Codeine and Sulfa antibiotics  Family History  Problem Relation Age of  Onset  . Stroke Father   . Kidney failure Father     Social History Social History  Substance Use Topics  . Smoking status: Never Smoker  . Smokeless tobacco: Never Used  . Alcohol use No    Review of Systems  Constitutional: Negative for fever. Eyes: Negative for visual changes. ENT: Negative for sore throat. Cardiovascular: Negative for chest pain. Respiratory: Negative for shortness of breath. Gastrointestinal: Negative for abdominal pain, vomiting or diarrhea. Genitourinary: Negative for dysuria. + R flank pain Musculoskeletal: Negative for back pain. Skin: Negative for rash. Neurological: Negative for headaches, weakness or numbness.  ____________________________________________   PHYSICAL EXAM:  VITAL SIGNS: ED Triage Vitals [06/25/16 0320]  Enc Vitals Group     BP 112/75     Pulse Rate 95     Resp (!) 22     Temp 97.6 F (36.4 C)     Temp Source Oral     SpO2 98 %     Weight 295 lb (133.8 kg)     Height 5\' 5"  (1.651 m)     Head Circumference      Peak Flow      Pain Score 10     Pain Loc      Pain Edu?      Excl. in [place]?     Constitutional: Alert and oriented, pacing up and down in the room in pain.  HEENT:      Head: Normocephalic and atraumatic.         Eyes: Conjunctivae are normal. Sclera is non-icteric. EOMI. PERRL      Mouth/Throat: Mucous membranes are moist.       Neck: Supple with no signs of meningismus. Cardiovascular: Regular rate and rhythm. No murmurs, gallops, or rubs. 2+ symmetrical distal pulses are present in all extremities. No JVD. Respiratory: Normal respiratory effort. Lungs are clear to auscultation bilaterally. No wheezes, crackles, or rhonchi.  Gastrointestinal: Soft, non tender, and non distended with positive bowel sounds. No rebound or guarding. Genitourinary: Mild R CVA tenderness. Musculoskeletal: Nontender with normal range of motion in all extremities. No edema, cyanosis, or erythema of extremities. Neurologic: Normal  speech and language. Face is symmetric. Moving all extremities. No gross focal neurologic deficits are appreciated. Skin: Skin is warm, dry and intact. No rash noted. Psychiatric: Mood and affect are normal. Speech and behavior are normal.  ____________________________________________   LABS (all labs ordered are listed, but only abnormal results are displayed)  Labs Reviewed  URINE CULTURE  URINALYSIS COMPLETEWITH MICROSCOPIC ([facility] ONLY)  CBC WITH DIFFERENTIAL/PLATELET  COMPREHENSIVE METABOLIC PANEL   ____________________________________________  EKG  none ____________________________________________  RADIOLOGY  CT renal:  1. Obstructing 5 mm stone at the right ureterovesicular junction with moderate hydronephrosis and perinephric edema. 2. Additional nonobstructing bilateral renal calculi. 3. Hepatomegaly and hepatic steatosis. 4. Probable cholelithiasis. ____________________________________________   PROCEDURES  Procedure(s) performed: None Procedures Critical Care performed:  None  ____________________________________________   INITIAL IMPRESSION / ASSESSMENT AND PLAN / ED COURSE  55 y.o. female with a history of asthma, diabetes, hypertension, kidney stones who presents for evaluation of right flank pain radiating to the right groin since earlier today and similar to prior kidney stones. Patient is in obvious distress And down the room, vital signs are within normal limits, she has mild right flank tenderness, no right lower quadrant tenderness, abdomen is soft and nontender throughout. We'll give IV morphine, IV fluids, IV Zofran, check basic labs, urinalysis, and CT renal.  Clinical Course   CT showing 5 mm Obstructing stone at the right UVJ. No evidence of urinary tract infection. Kidney functions within normal limits. Pain is well controlled. We'll discharge home on Percocet and Zofran and close follow-up with primary care doctor. I discussed strict return  precautions for any signs of overlying infection including dysuria, worsening pain, fever or vomiting, or abdominal pain. Patient agrees to return if these develop.  Pertinent labs & imaging results that were available during my care of the patient were reviewed by me and considered in my medical decision making (see chart for details).    ____________________________________________   FINAL CLINICAL IMPRESSION(S) / ED DIAGNOSES  Final diagnoses:  None      NEW MEDICATIONS STARTED DURING THIS VISIT:  New Prescriptions   No medications on file     Note:  This document was prepared using Dragon voice recognition software and may include unintentional dictation errors.    Rudene Re, MD 06/25/16 [id number]

## 2016-06-25 NOTE — ED Notes (Signed)

## 2016-06-25 NOTE — Discharge Instructions (Signed)

## 2016-06-26 LAB — URINE CULTURE

## 2016-09-28 ENCOUNTER — Ambulatory Visit
Admission: EM | Admit: 2016-09-28 | Discharge: 2016-09-28 | Disposition: A | Discharge status: Home / Self Care | Payer: Managed Care, Other (non HMO) | Attending: Family Medicine | Admitting: Family Medicine

## 2016-09-28 DIAGNOSIS — R69 Illness, unspecified: Secondary | ICD-10-CM | POA: Diagnosis not present

## 2016-09-28 DIAGNOSIS — J45901 Unspecified asthma with (acute) exacerbation: Secondary | ICD-10-CM | POA: Diagnosis not present

## 2016-09-28 DIAGNOSIS — J111 Influenza due to unidentified influenza virus with other respiratory manifestations: Secondary | ICD-10-CM

## 2016-09-28 LAB — RAPID INFLUENZA A&B ANTIGENS
Influenza A (ARMC): NEGATIVE
Influenza B (ARMC): NEGATIVE

## 2016-09-28 MED ORDER — ALBUTEROL SULFATE (2.5 MG/3ML) 0.083% IN NEBU: 2.5000 mg | INHALATION_SOLUTION | Freq: Four times a day (QID) | RESPIRATORY_TRACT | 1 refills | Status: DC | PRN

## 2016-09-28 MED ORDER — OSELTAMIVIR PHOSPHATE 75 MG PO CAPS: 75.0000 mg | ORAL_CAPSULE | Freq: Two times a day (BID) | ORAL | 0 refills | Status: DC

## 2016-09-28 NOTE — ED Triage Notes (Signed)
Patient complains of cough and congestion. Patient states that her husband and son both are positive for the flu. Patient states that she has not ran any fevers. Patient reports that she is an asthmatic. Patient states that symptoms started last night. Patient states that she is feeling her asthma is flaring up, reports that she has difficulty with deep breaths.

## 2016-09-28 NOTE — ED Provider Notes (Signed)
[facility] ____________________________________________  Time seen: Approximately 5:38 PM  I have reviewed the triage vital signs and the nursing notes.   HISTORY  Chief Complaint Cough   HPI Joy Roberts is a 56 y.o. female present for the complaints of cough and chest congestion. Patient reports that she has a history of asthma that flares up when she is sick and reports she is out of her home nebulizer treatments which prompted her to be evaluated. Patient reports cough started with congestion as of last night into this morning. Patient reports her son and her husband both tested positive for influenza yesterday and today. Denies nasal congestion, rhinorrhea or sore throat. Reports cough with chest congestion with intermittent wheeze. Reports continues to eat and drink well. Denies known fevers. Reports no medications being taken today.  Denies chest pain, shortness of breath, abdominal pain, dysuria, extremity pain, extremity swelling. Denies recent sickness or recent immobilization.   Past Medical History:  Diagnosis Date  . Asthma   . Diabetes ([place])   . Hypertension   . Kidney stones   . Osteoarthritis     There are no active problems to display for this patient.   Past Surgical History:  Procedure Laterality Date  . CESAREAN SECTION  2003     No current facility-administered medications for this encounter.   Current Outpatient Prescriptions:  .  amLODipine-benazepril (LOTREL) 10-20 MG capsule, Take 1 capsule by mouth daily., Disp: , Rfl:  .  atorvastatin (LIPITOR) 10 MG tablet, Take 10 mg by mouth daily., Disp: , Rfl:  .  budesonide (PULMICORT) 0.5 MG/2ML nebulizer solution, Take 2 mLs (0.5 mg total) by nebulization every 6 (six) hours as needed., Disp: 84 mL, Rfl: 0 .  etodolac (LODINE) 500 MG tablet, Take 500 mg by mouth 2 (two) times daily., Disp: , Rfl:  .  hydrochlorothiazide (HYDRODIURIL) 25 MG tablet, Take 25 mg by mouth daily., Disp: ,  Rfl:  .  ketorolac (TORADOL) 10 MG tablet, Take 1 tablet (10 mg total) by mouth every 6 (six) hours as needed., Disp: 20 tablet, Rfl: 0 .  metFORMIN (GLUCOPHAGE) 500 MG tablet, Take 1,000 mg by mouth 4 (four) times daily. , Disp: , Rfl:  .  ondansetron (ZOFRAN ODT) 4 MG disintegrating tablet, Take 1 tablet (4 mg total) by mouth every 8 (eight) hours as needed for nausea or vomiting., Disp: 20 tablet, Rfl: 0 .  sitaGLIPtin (JANUVIA) 50 MG tablet, Take 50 mg by mouth daily., Disp: , Rfl:  .  albuterol (PROVENTIL) (2.5 MG/3ML) 0.083% nebulizer solution, Take 3 mLs (2.5 mg total) by nebulization every 6 (six) hours as needed for wheezing or shortness of breath., Disp: 75 mL, Rfl: 1 .  benzonatate (TESSALON) 200 MG capsule, Take 1 capsule (200 mg total) by mouth 3 (three) times daily as needed for cough. (Patient not taking: Reported on 02/12/2016), Disp: 30 capsule, Rfl: 1 .  celecoxib (CELEBREX) 400 MG capsule, Take 1 capsule (400 mg total) by mouth daily after breakfast., Disp: 30 capsule, Rfl: 0 .  fluticasone (FLONASE) 50 MCG/ACT nasal spray, Place 1 spray into both nostrils 2 (two) times daily. (Patient not taking: Reported on 02/12/2016), Disp: 16 g, Rfl: 0 .  loratadine (CLARITIN) 10 MG tablet, Take 1 tablet (10 mg total) by mouth daily. (Patient not taking: Reported on 02/12/2016), Disp: 30 tablet, Rfl: 0 .  meloxicam (MOBIC) 15 MG tablet, Take 15 mg by mouth daily. Reported on 02/12/2016, Disp: , Rfl:  .  orphenadrine (  NORFLEX) 100 MG tablet, Take 1 tablet (100 mg total) by mouth 2 (two) times daily., Disp: 60 tablet, Rfl: 0 .  oseltamivir (TAMIFLU) 75 MG capsule, Take 1 capsule (75 mg total) by mouth every 12 (twelve) hours., Disp: 10 capsule, Rfl: 0 .  oxyCODONE-acetaminophen (ROXICET) 5-325 MG tablet, Take 1 tablet by mouth every 4 (four) hours as needed for severe pain., Disp: 20 tablet, Rfl: 0 .  sodium chloride (OCEAN) 0.65 % SOLN nasal spray, Place 2 sprays into both nostrils every 2 (two)  hours while awake. (Patient not taking: Reported on 02/12/2016), Disp: , Rfl: 0 .  tamsulosin (FLOMAX) 0.4 MG CAPS capsule, Take 1 capsule (0.4 mg total) by mouth daily., Disp: 10 capsule, Rfl: 0 .  traMADol (ULTRAM) 50 MG tablet, Take 1 tablet (50 mg total) by mouth every 6 (six) hours as needed., Disp: 10 tablet, Rfl: 0  Allergies Codeine and Sulfa antibiotics  Family History  Problem Relation Age of Onset  . Stroke Father   . Kidney failure Father     Social History Social History  Substance Use Topics  . Smoking status: Never Smoker  . Smokeless tobacco: Never Used  . Alcohol use No    Review of Systems Constitutional: No fever/chills Eyes: No visual changes. ENT: No sore throat. Cardiovascular: Denies chest pain. Respiratory: Denies shortness of breath. Gastrointestinal: No abdominal pain.  No nausea, no vomiting.  No diarrhea.  No constipation. Genitourinary: Negative for dysuria. Musculoskeletal: Negative for back pain. Skin: Negative for rash. Neurological: Negative for headaches, focal weakness or numbness.  10-point ROS otherwise negative.  ____________________________________________   PHYSICAL EXAM:  VITAL SIGNS: ED Triage Vitals  Enc Vitals Group     BP 09/28/16 1651 131/63     Pulse Rate 09/28/16 1651 (!) 106 Recheck 94     Resp 09/28/16 1651 18     Temp 09/28/16 1651 99.1 F (37.3 C)     Temp Source 09/28/16 1651 Oral     SpO2 09/28/16 1651 99 %     Weight 09/28/16 1646 290 lb (131.5 kg)     Height 09/28/16 1646 5' 5.5" (1.664 m)     Head Circumference --      Peak Flow --      Pain Score 09/28/16 1651 3     Pain Loc --      Pain Edu? --      Excl. in [place]? --     Constitutional: Alert and oriented. Well appearing and in no acute distress. Eyes: Conjunctivae are normal. PERRL. EOMI. Head: Atraumatic. No sinus tenderness to palpation. No swelling. No erythema.  Ears: no erythema, normal TMs bilaterally.   Nose: No nasal congestion or  rhinorrhea.  Mouth/Throat: Mucous membranes are moist. No pharyngeal erythema. No tonsillar swelling or exudate.  Neck: No stridor.  No cervical spine tenderness to palpation. Hematological/Lymphatic/Immunilogical: No cervical lymphadenopathy. Cardiovascular: Normal rate, regular rhythm. Grossly normal heart sounds.  Good peripheral circulation. Respiratory: Normal respiratory effort.  No retractions.No wheezes. No rhonchi or rales. Dry intermittent cough noted in room with mild bronchospasm wheeze with cough. Speaks in complete sentences. Good air movement.  Gastrointestinal: Soft and nontender. No CVA tenderness. Musculoskeletal: No lower extremity tenderness nor edema. Ambulatory with steady gait. No cervical, thoracic or lumbar tenderness to palpation. Neurologic:  Normal speech and language. No gait instability. Skin:  Skin is warm, dry and intact. No rash noted. Psychiatric: Mood and affect are normal. Speech and behavior are normal. ___________________________________________   LABS (  all labs ordered are listed, but only abnormal results are displayed)  Labs Reviewed  RAPID INFLUENZA A&B ANTIGENS ([place])    PROCEDURES Procedures   INITIAL IMPRESSION / [place] / ED COURSE  Pertinent labs & imaging results that were available during my care of the patient were reviewed by me and considered in my medical decision making (see chart for details).  Well-appearing patient. No acute distress. Presents with a cough and chest congestion since last night. Patient reports that her husband and son tested positive for influenza yesterday and today. Patient expressed concern of influenza. Patient influenza test negative, however discuss and suspect false negative. Discussed in detail with patient regarding treatment options with patient in the use of Tamiflu. Patient requests treatment with oral Tamiflu for flu dosing. We'll also refill patient albuterol nebulizer solution for use  as needed for wheezing. Encourage rest, fluids and supportive care. Patient denies need for prescription cough medications.Discussed indication, risks and benefits of medications with patient.  Discussed follow up with Primary care physician this week. Discussed follow up and return parameters including no resolution or any worsening concerns. Patient verbalized understanding and agreed to plan.   ____________________________________________   FINAL CLINICAL IMPRESSION(S) / ED DIAGNOSES  Final diagnoses:  Influenza-like illness  Exacerbation of asthma, unspecified asthma severity, unspecified whether persistent     Discharge Medication List as of 09/28/2016  5:40 PM    START taking these medications   Details  oseltamivir (TAMIFLU) 75 MG capsule Take 1 capsule (75 mg total) by mouth every 12 (twelve) hours., Starting Mon 09/28/2016, Normal        Note: This dictation was prepared with Dragon dictation along with smaller phrase technology. Any transcriptional errors that result from this process are unintentional.    Clinical Course       Marylene Land, NP 09/28/16 1749

## 2016-09-28 NOTE — Discharge Instructions (Signed)
Take medication as prescribed. Rest. Drink plenty of fluids.  ° °Follow up with your primary care physician this week as needed. Return to Urgent care for new or worsening concerns.  ° °

## 2016-12-30 ENCOUNTER — Encounter: Payer: Self-pay | Admitting: *Deleted

## 2016-12-30 ENCOUNTER — Ambulatory Visit (INDEPENDENT_AMBULATORY_CARE_PROVIDER_SITE_OTHER): Payer: Managed Care, Other (non HMO)

## 2016-12-30 ENCOUNTER — Ambulatory Visit
Admission: EM | Admit: 2016-12-30 | Discharge: 2016-12-30 | Disposition: A | Discharge status: Home / Self Care | Payer: Managed Care, Other (non HMO) | Attending: Family Medicine | Admitting: Family Medicine

## 2016-12-30 DIAGNOSIS — S8251XA Displaced fracture of medial malleolus of right tibia, initial encounter for closed fracture: Secondary | ICD-10-CM | POA: Diagnosis not present

## 2016-12-30 DIAGNOSIS — Z87442 Personal history of urinary calculi: Secondary | ICD-10-CM

## 2016-12-30 DIAGNOSIS — S92154A Nondisplaced avulsion fracture (chip fracture) of right talus, initial encounter for closed fracture: Secondary | ICD-10-CM

## 2016-12-30 DIAGNOSIS — M25561 Pain in right knee: Secondary | ICD-10-CM

## 2016-12-30 DIAGNOSIS — S8254XA Nondisplaced fracture of medial malleolus of right tibia, initial encounter for closed fracture: Secondary | ICD-10-CM | POA: Diagnosis not present

## 2016-12-30 DIAGNOSIS — M25571 Pain in right ankle and joints of right foot: Secondary | ICD-10-CM

## 2016-12-30 LAB — URINALYSIS, COMPLETE (UACMP) WITH MICROSCOPIC
Glucose, UA: NEGATIVE mg/dL
Hgb urine dipstick: NEGATIVE
Ketones, ur: NEGATIVE mg/dL
Leukocytes, UA: NEGATIVE
NITRITE: NEGATIVE
Protein, ur: NEGATIVE mg/dL
SPECIFIC GRAVITY, URINE: 1.025 (ref 1.005–1.030)
pH: 5.5 (ref 5.0–8.0)

## 2016-12-30 MED ORDER — TRAMADOL HCL 50 MG PO TABS: 50.0000 mg | ORAL_TABLET | Freq: Three times a day (TID) | ORAL | 0 refills | Status: DC | PRN

## 2016-12-30 NOTE — Discharge Instructions (Signed)
Take medication as prescribed. Rest. Drink plenty of fluids. Keep in splint. Ice and elevate.   Follow up with orthopedic this week. See above to call to schedule.   Follow up with your primary care physician this week as needed. Return to Urgent care for new or worsening concerns.

## 2016-12-30 NOTE — ED Triage Notes (Signed)
Pt lost her balance while standing from seated position this morning. Now c/o right ankle and knee pain and edema. Difficulty bearing weight on right ankle.

## 2016-12-30 NOTE — ED Provider Notes (Signed)
[facility] ____________________________________________  Time seen: Approximately 4:15 PM  I have reviewed the triage vital signs and the nursing notes.   HISTORY  Chief Complaint Ankle Pain and Knee Pain   HPI Joy Roberts is a 56 y.o. female presenting for evaluation of right ankle and right knee pain post injury late last night. Patient reports that she had been sitting on her couch watching television with her right leg propped up and somewhat of an awkward position, reports that she then had to go to the bathroom quickly so she got up quickly. Patient states that she leads she got up too fast and lost her balance causing her to fall forward. Patient states when she stood up she rolled her right ankle and then fell down on her right knee. Reports she did hit her chin on her TV stand denies any pain to that area. Denies head injury or loss of consciousness. Patient states that she is here primarily due to right ankle pain and pain with weightbearing. Patient reports has been able to apply some weight but not full weight and has been ambulating with a cane at home. States minimal pain to right knee but wanted to have him evaluated at this time as well. Denies any other pain or injury. Denies any paresthesias, pain radiation or other injury. Patient states that she has frequent kidney stones and thinks that she passed one last night which was contributing to her urinary frequency. Denies current dysuria and states that she feels like her flank pain from her stone has fully resolved and she passed the stone. Denies any abdominal or urinary complaints this time.  Denies chest pain, shortness of breath, abdominal pain, dysuria, extremity pain, extremity swelling or rash. Denies recent sickness. Denies recent antibiotic use. Reports follows with [facility]. Patient denies any dizziness, chest pain, lightheadedness or weakness sensation and states that she fell only because  she got too quickly after having leg and awkward position.  Sharyne Peach, MD: PCP     Past Medical History:  Diagnosis Date  . Asthma   . Diabetes ([place])   . Hypertension   . Kidney stones   . Osteoarthritis     There are no active problems to display for this patient.   Past Surgical History:  Procedure Laterality Date  . CESAREAN SECTION  2003     No current facility-administered medications for this encounter.   Current Outpatient Prescriptions:  .  amLODipine-benazepril (LOTREL) 10-20 MG capsule, Take 1 capsule by mouth daily., Disp: , Rfl:  .  atorvastatin (LIPITOR) 10 MG tablet, Take 10 mg by mouth daily., Disp: , Rfl:  .  etodolac (LODINE) 500 MG tablet, Take 500 mg by mouth 2 (two) times daily., Disp: , Rfl:  .  hydrochlorothiazide (HYDRODIURIL) 25 MG tablet, Take 25 mg by mouth daily., Disp: , Rfl:  .  loratadine (CLARITIN) 10 MG tablet, Take 1 tablet (10 mg total) by mouth daily., Disp: 30 tablet, Rfl: 0 .  metFORMIN (GLUCOPHAGE) 500 MG tablet, Take 1,000 mg by mouth 4 (four) times daily. , Disp: , Rfl:  .  sitaGLIPtin (JANUVIA) 100 MG tablet, Take 100 mg by mouth daily., Disp: , Rfl:  .  tamsulosin (FLOMAX) 0.4 MG CAPS capsule, Take 1 capsule (0.4 mg total) by mouth daily., Disp: 10 capsule, Rfl: 0 .  albuterol (PROVENTIL) (2.5 MG/3ML) 0.083% nebulizer solution, Take 3 mLs (2.5 mg total) by nebulization every 6 (six) hours as needed for wheezing or  shortness of breath., Disp: 75 mL, Rfl: 1 .  benzonatate (TESSALON) 200 MG capsule, Take 1 capsule (200 mg total) by mouth 3 (three) times daily as needed for cough. (Patient not taking: Reported on 02/12/2016), Disp: 30 capsule, Rfl: 1 .  budesonide (PULMICORT) 0.5 MG/2ML nebulizer solution, Take 2 mLs (0.5 mg total) by nebulization every 6 (six) hours as needed., Disp: 84 mL, Rfl: 0 .  celecoxib (CELEBREX) 400 MG capsule, Take 1 capsule (400 mg total) by mouth daily after breakfast., Disp: 30 capsule, Rfl: 0 .   fluticasone (FLONASE) 50 MCG/ACT nasal spray, Place 1 spray into both nostrils 2 (two) times daily. (Patient not taking: Reported on 02/12/2016), Disp: 16 g, Rfl: 0 .  ketorolac (TORADOL) 10 MG tablet, Take 1 tablet (10 mg total) by mouth every 6 (six) hours as needed., Disp: 20 tablet, Rfl: 0 .  meloxicam (MOBIC) 15 MG tablet, Take 15 mg by mouth daily. Reported on 02/12/2016, Disp: , Rfl:  .  ondansetron (ZOFRAN ODT) 4 MG disintegrating tablet, Take 1 tablet (4 mg total) by mouth every 8 (eight) hours as needed for nausea or vomiting., Disp: 20 tablet, Rfl: 0 .  orphenadrine (NORFLEX) 100 MG tablet, Take 1 tablet (100 mg total) by mouth 2 (two) times daily., Disp: 60 tablet, Rfl: 0 .  oseltamivir (TAMIFLU) 75 MG capsule, Take 1 capsule (75 mg total) by mouth every 12 (twelve) hours., Disp: 10 capsule, Rfl: 0 .  oxyCODONE-acetaminophen (ROXICET) 5-325 MG tablet, Take 1 tablet by mouth every 4 (four) hours as needed for severe pain., Disp: 20 tablet, Rfl: 0 .  sitaGLIPtin (JANUVIA) 50 MG tablet, Take 50 mg by mouth daily., Disp: , Rfl:  .  sodium chloride (OCEAN) 0.65 % SOLN nasal spray, Place 2 sprays into both nostrils every 2 (two) hours while awake. (Patient not taking: Reported on 02/12/2016), Disp: , Rfl: 0 .  traMADol (ULTRAM) 50 MG tablet, Take 1 tablet (50 mg total) by mouth every 6 (six) hours as needed., Disp: 10 tablet, Rfl: 0 .  traMADol (ULTRAM) 50 MG tablet, Take 1 tablet (50 mg total) by mouth every 8 (eight) hours as needed (Do not drive or operate machinery while taking as can cause drowsiness.)., Disp: 10 tablet, Rfl: 0  Allergies Codeine and Sulfa antibiotics  Family History  Problem Relation Age of Onset  . Stroke Father   . Kidney failure Father     Social History Social History  Substance Use Topics  . Smoking status: Never Smoker  . Smokeless tobacco: Never Used  . Alcohol use No    Review of Systems Constitutional: No fever/chills Eyes: No visual changes. ENT: No  sore throat. Cardiovascular: Denies chest pain. Respiratory: Denies shortness of breath. Gastrointestinal: No abdominal pain.  No nausea, no vomiting.  No diarrhea.  No constipation. Genitourinary: Negative for dysuria. Musculoskeletal: Negative for back pain.As above  Skin: Negative for rash. Neurological: Negative for headaches, focal weakness or numbness.  10-point ROS otherwise negative.  ____________________________________________   PHYSICAL EXAM:  VITAL SIGNS: ED Triage Vitals  Enc Vitals Group     BP 12/30/16 1321 112/66     Pulse Rate 12/30/16 1321 97     Resp 12/30/16 1321 16     Temp 12/30/16 1321 99.6 F (37.6 C)     Temp Source 12/30/16 1321 Oral     SpO2 12/30/16 1321 95 %     Weight 12/30/16 1322 290 lb (131.5 kg)     Height 12/30/16 1322 5'  5" (1.651 m)     Head Circumference --      Peak Flow --      Pain Score 12/30/16 1726 5     Pain Loc --      Pain Edu? --      Excl. in [place]? --     Constitutional: Alert and oriented. Well appearing and in no acute distress. Eyes: Conjunctivae are normal. PERRL. EOMI. ENT      Head: Normocephalic and atraumatic.No facial tenderness.       Nose: No congestion/rhinnorhea. Neck: No stridor. Supple without meningismus.  Hematological/Lymphatic/Immunilogical: No cervical lymphadenopathy. Cardiovascular: Normal rate, regular rhythm. Grossly normal heart sounds.  Good peripheral circulation. Respiratory: Normal respiratory effort without tachypnea nor retractions. Breath sounds are clear and equal bilaterally. No wheezes, rales, rhonchi. Gastrointestinal: Soft and nontender. No distention. Normal Bowel sounds. No CVA tenderness. Musculoskeletal:  No midline cervical, thoracic or lumbar tenderness to palpation. Bilateral pedal pulses equal and easily palpated.  except : Minimal tenderness to anterior knee, full range of motion present, no pain with resisted flexion or extension, no pain with anterior-posterior drawer test and  no pain with medial or lateral stress.  Except : Moderate tenderness to palpation to right lateral malleolus with mild to moderate localized edema, slight ecchymosis, no erythema, skin intact, no posterior ankle or medial ankle tenderness to palpation, right foot nontender, normal sensation and capillary refill to right foot. Right calf nontender. No tenderness to right hip and hip with full range of motion. Right lower extremity otherwise nontender.  Neurologic:  Normal speech and language. No gross focal neurologic deficits are appreciated. Speech is normal. No gait instability.  Skin:  Skin is warm, dry and intact. No rash noted. Psychiatric: Mood and affect are normal. Speech and behavior are normal. Patient exhibits appropriate insight and judgment   ___________________________________________   LABS (all labs ordered are listed, but only abnormal results are displayed)  Labs Reviewed  URINALYSIS, COMPLETE (UACMP) WITH MICROSCOPIC - Abnormal; Notable for the following:       Result Value   Bilirubin Urine LARGE (*)    Squamous Epithelial / LPF TOO NUMEROUS TO COUNT (*)    Bacteria, UA FEW (*)    All other components within normal limits  URINE CULTURE  Discuss urine results with patient.  ____________________________________________  RADIOLOGY  Dg Ankle Complete Right  Result Date: 12/30/2016 CLINICAL DATA:  Stroke balance when standing from a seated position. The patient reports right knee and ankle pain and swelling with difficulty bearing weight. EXAM: RIGHT ANKLE - COMPLETE 3+ VIEW COMPARISON:  None in PACs FINDINGS: There is a large amount of soft tissue swelling about the ankle diffusely. There is a tiny bony density that lies in the interspace between the distal fibula and the lateral aspect of the talus which likely reflects an avulsion fracture. A second bony density lies along the lateral aspect of the talus and also likely reflects an avulsion. No typical malleolar fracture  is observed. The talar dome is intact. There are plantar and Achilles region calcaneal spurs. The metatarsal bases appear intact. IMPRESSION: Two bony densities adjacent to the lateral aspect of the talus, 1 slightly medial to the distal fibula and a second inferior to the distal fibula. These likely reflect small avulsion fracture fragments from the talus. There is a tiny bony density adjacent to the medial malleolus which may reflect an avulsion fracture fragment. There is diffuse soft tissue swelling. Electronically Signed   By: Shanon Brow  [place] M.D.   On: 12/30/2016 15:49   Dg Knee Complete 4 Views Right  Result Date: 12/30/2016 CLINICAL DATA:  Anterior knee pain status post a fall. EXAM: RIGHT KNEE - COMPLETE 4+ VIEW COMPARISON:  None in PACs FINDINGS: The bones are subjectively adequately mineralized. There is no acute fracture nor dislocation. There is mild narrowing of the medial joint compartment. There is beaking of the tibial spines. A spur arises from the periphery of the articular margin of the medial femoral condyle. There is no joint effusion. IMPRESSION: No acute fracture nor dislocation of the right knee. There is moderate osteoarthritic joint space loss of the medial compartment. There is mild degenerative spurring of the tibial spines and the peripheral articular margin of the medial femoral condyle. Electronically Signed   By: David  [place] M.D.   On: 12/30/2016 15:51   ____________________________________________   PROCEDURES Procedures   right posterior and stirrup OCL splint applied by RN.   INITIAL IMPRESSION / ASSESSMENT AND PLAN / ED COURSE  Pertinent labs & imaging results that were available during my care of the patient were reviewed by me and considered in my medical decision making (see chart for details).  Well-appearing patient. No acute distress. Patient had what she describes as a mechanical fall that occurred approximately 12 AM last night. Denies other pain or  injury. Nursing staff had performed urinalysis and discuss results with patient. Patient denies any dysuria at this time but had expressed concern of recent stone, no RBCs or hemoglobin present and to discuss few bacteria and bilirubin being present with patient and patient reports will follow up with primary care. Right knee x-ray per radiologist no acute fracture nor dislocation of the knee, moderate osteoarthritic joint space. Per radiologist right ankle x-ray 2 bony densities adjacent to the lateral aspect of the talus, slightly medial to the distal fibula and second inferior to the distal fibula, likely reflects small avulsion fracture fragments from the talus, also tiny bony density adjacent to the medial malleolus which may reflect avulsion fracture fragment with diffuse soft tissue swelling.   Discussed x-ray report in detail with patient and spouse at bedside. Splint applied. Encourage nonweightbearing, rest, ice and supportive care. Encouraged patient to follow-up with orthopedics this week. Rx given for tramadol as needed for breakthrough pain as patient reports she has tolerated this in the past. Urgent care does not currently have walkers on site, hard copy Rx given for standard walker.Discussed indication, risks and benefits of medications with patient.  Discussed follow up with Primary care physician this week. Discussed follow up and return parameters including no resolution or any worsening concerns. Patient verbalized understanding and agreed to plan.   ____________________________________________   FINAL CLINICAL IMPRESSION(S) / ED DIAGNOSES  Final diagnoses:  Acute pain of right knee  Closed nondisplaced avulsion fracture of right talus, initial encounter  Avulsion fracture of medial malleolus, right, closed, initial encounter  History of kidney stones     Discharge Medication List as of 12/30/2016  4:50 PM    START taking these medications   Details  !! traMADol (ULTRAM) 50  MG tablet Take 1 tablet (50 mg total) by mouth every 8 (eight) hours as needed (Do not drive or operate machinery while taking as can cause drowsiness.)., Starting Wed 12/30/2016, Print     !! - Potential duplicate medications found. Please discuss with provider.      Note: This dictation was prepared with Dragon dictation along with smaller phrase technology. Any transcriptional errors  that result from this process are unintentional.         Marylene Land, NP 12/30/16 2246

## 2017-01-01 LAB — URINE CULTURE: Culture: NO GROWTH

## 2017-05-31 LAB — TSH: TSH: 3.7 (ref 0.41–5.90)

## 2017-05-31 LAB — LIPID PANEL
CHOLESTEROL: 153 (ref 0–200)
HDL: 40 (ref 35–70)
LDL CALC: 75
Triglycerides: 189 — AB (ref 40–160)

## 2017-05-31 LAB — MICROALBUMIN, URINE

## 2017-05-31 LAB — HEMOGLOBIN A1C: HEMOGLOBIN A1C: 7.8

## 2017-05-31 LAB — BASIC METABOLIC PANEL
BUN: 20 (ref 4–21)
Creatinine: 1.1 (ref 0.5–1.1)

## 2017-05-31 LAB — CBC AND DIFFERENTIAL
Hemoglobin: 12 (ref 12.0–16.0)
WBC: 8.4

## 2017-09-15 ENCOUNTER — Other Ambulatory Visit: Payer: Self-pay

## 2017-09-15 ENCOUNTER — Emergency Department
Admission: EM | Admit: 2017-09-15 | Discharge: 2017-09-15 | Disposition: A | Discharge status: Home / Self Care | Payer: Managed Care, Other (non HMO) | Attending: Emergency Medicine | Admitting: Emergency Medicine

## 2017-09-15 DIAGNOSIS — E119 Type 2 diabetes mellitus without complications: Secondary | ICD-10-CM | POA: Diagnosis not present

## 2017-09-15 DIAGNOSIS — J45909 Unspecified asthma, uncomplicated: Secondary | ICD-10-CM | POA: Diagnosis not present

## 2017-09-15 DIAGNOSIS — Z79899 Other long term (current) drug therapy: Secondary | ICD-10-CM | POA: Diagnosis not present

## 2017-09-15 DIAGNOSIS — Z7984 Long term (current) use of oral hypoglycemic drugs: Secondary | ICD-10-CM | POA: Insufficient documentation

## 2017-09-15 DIAGNOSIS — N200 Calculus of kidney: Secondary | ICD-10-CM | POA: Diagnosis not present

## 2017-09-15 DIAGNOSIS — R109 Unspecified abdominal pain: Secondary | ICD-10-CM

## 2017-09-15 DIAGNOSIS — I10 Essential (primary) hypertension: Secondary | ICD-10-CM | POA: Insufficient documentation

## 2017-09-15 LAB — URINALYSIS, COMPLETE (UACMP) WITH MICROSCOPIC
Bacteria, UA: NONE SEEN
Bilirubin Urine: NEGATIVE
GLUCOSE, UA: NEGATIVE mg/dL
HGB URINE DIPSTICK: NEGATIVE
Ketones, ur: NEGATIVE mg/dL
Leukocytes, UA: NEGATIVE
NITRITE: NEGATIVE
PH: 5 (ref 5.0–8.0)
PROTEIN: NEGATIVE mg/dL
SPECIFIC GRAVITY, URINE: 1.014 (ref 1.005–1.030)
Squamous Epithelial / LPF: NONE SEEN

## 2017-09-15 LAB — BASIC METABOLIC PANEL
ANION GAP: 11 (ref 5–15)
BUN: 19 mg/dL (ref 6–20)
CHLORIDE: 100 mmol/L — AB (ref 101–111)
CO2: 25 mmol/L (ref 22–32)
Calcium: 9.2 mg/dL (ref 8.9–10.3)
Creatinine, Ser: 1.28 mg/dL — ABNORMAL HIGH (ref 0.44–1.00)
GFR calc Af Amer: 54 mL/min — ABNORMAL LOW (ref >60)
GFR, EST NON AFRICAN AMERICAN: 46 mL/min — AB (ref >60)
GLUCOSE: 171 mg/dL — AB (ref 65–99)
POTASSIUM: 3.6 mmol/L (ref 3.5–5.1)
Sodium: 136 mmol/L (ref 135–145)

## 2017-09-15 LAB — CBC
HCT: 37.8 % (ref 35.0–47.0)
HEMOGLOBIN: 12.9 g/dL (ref 12.0–16.0)
MCH: 28.9 pg (ref 26.0–34.0)
MCHC: 34 g/dL (ref 32.0–36.0)
MCV: 84.9 fL (ref 80.0–100.0)
Platelets: 430 10*3/uL (ref 150–440)
RBC: 4.45 MIL/uL (ref 3.80–5.20)
RDW: 13.7 % (ref 11.5–14.5)
WBC: 10.4 10*3/uL (ref 3.6–11.0)

## 2017-09-15 MED ORDER — OXYCODONE-ACETAMINOPHEN 5-325 MG PO TABS
1.0000 | ORAL_TABLET | Freq: Once | ORAL | Status: DC
  Filled 2017-09-15: qty 1

## 2017-09-15 MED ORDER — KETOROLAC TROMETHAMINE 30 MG/ML IJ SOLN
30.0000 mg | Freq: Once | INTRAMUSCULAR | Status: AC
  Administered 2017-09-15: 30 mg via INTRAVENOUS
  Filled 2017-09-15: qty 1

## 2017-09-15 MED ORDER — MORPHINE SULFATE (PF) 4 MG/ML IV SOLN
4.0000 mg | Freq: Once | INTRAVENOUS | Status: AC
  Administered 2017-09-15: 4 mg via INTRAVENOUS
  Filled 2017-09-15: qty 1

## 2017-09-15 MED ORDER — ONDANSETRON 4 MG PO TBDP: 4.0000 mg | ORAL_TABLET | Freq: Three times a day (TID) | ORAL | 0 refills | Status: DC | PRN

## 2017-09-15 MED ORDER — ONDANSETRON HCL 4 MG/2ML IJ SOLN
4.0000 mg | Freq: Once | INTRAMUSCULAR | Status: AC
  Administered 2017-09-15: 4 mg via INTRAVENOUS
  Filled 2017-09-15: qty 2

## 2017-09-15 MED ORDER — TAMSULOSIN HCL 0.4 MG PO CAPS: 0.4000 mg | ORAL_CAPSULE | Freq: Every day | ORAL | 0 refills | Status: DC

## 2017-09-15 NOTE — ED Provider Notes (Signed)
[facility] Emergency Department Provider Note   ____________________________________________   First MD Initiated Contact with Patient 09/15/17 0147     (approximate)  I have reviewed the triage vital signs and the nursing notes.   HISTORY  Chief Complaint Flank Pain    HPI Joy Roberts is a 57 y.o. female who comes into the hospital today with a concern for a kidney stone.  She reports that around 1115 she went to bed and as she was falling asleep she woke up with some right-sided flank pain.  She states that the pain wraps from her back around to her abdomen.  She reports that she has a history of kidney stones and this is how they typically present.  The patient denies any blood in her urine.  She has had some trouble urinating and she denies any nausea or vomiting.  The patient has a history of lithotripsies.  She states that the last time she had to be here for a kidney stone was in the fall 2017.  The patient tried taking some tramadol at home but she reports that after some time it was not getting better so she decided to come in for evaluation.  The patient states that her pain currently is 0 out of 10 in intensity.  Past Medical History:  Diagnosis Date  . Asthma   . Diabetes ([place])   . Hypertension   . Kidney stones   . Osteoarthritis     There are no active problems to display for this patient.   Past Surgical History:  Procedure Laterality Date  . CESAREAN SECTION  2003    Prior to Admission medications   Medication Sig Start Date End Date Taking? Authorizing Provider  albuterol (PROVENTIL) (2.5 MG/3ML) 0.083% nebulizer solution Take 3 mLs (2.5 mg total) by nebulization every 6 (six) hours as needed for wheezing or shortness of breath. 09/28/16   Marylene Land, NP  amLODipine-benazepril (LOTREL) 10-20 MG capsule Take 1 capsule by mouth daily.    [provider]  atorvastatin (LIPITOR) 10 MG tablet Take 10 mg by mouth  daily.    [provider]  benzonatate (TESSALON) 200 MG capsule Take 1 capsule (200 mg total) by mouth 3 (three) times daily as needed for cough. Patient not taking: Reported on 02/12/2016 08/31/15   Wynona Luna, MD  budesonide (PULMICORT) 0.5 MG/2ML nebulizer solution Take 2 mLs (0.5 mg total) by nebulization every 6 (six) hours as needed. 10/17/15 09/28/16  Betancourt, Aura Fey, NP  celecoxib (CELEBREX) 400 MG capsule Take 1 capsule (400 mg total) by mouth daily after breakfast. 02/14/16   Frederich Cha, MD  etodolac (LODINE) 500 MG tablet Take 500 mg by mouth 2 (two) times daily.    [provider]  fluticasone (FLONASE) 50 MCG/ACT nasal spray Place 1 spray into both nostrils 2 (two) times daily. Patient not taking: Reported on 02/12/2016 10/17/15   Gerarda Fraction A, NP  hydrochlorothiazide (HYDRODIURIL) 25 MG tablet Take 25 mg by mouth daily.    [provider]  ketorolac (TORADOL) 10 MG tablet Take 1 tablet (10 mg total) by mouth every 6 (six) hours as needed. 04/09/16   Cuthriell, Charline Bills, PA-C  loratadine (CLARITIN) 10 MG tablet Take 1 tablet (10 mg total) by mouth daily. 10/17/15   Betancourt, Aura Fey, NP  meloxicam (MOBIC) 15 MG tablet Take 15 mg by mouth daily. Reported on 02/12/2016    [provider]  metFORMIN (GLUCOPHAGE) 500 MG  tablet Take 1,000 mg by mouth 4 (four) times daily.     [provider]  ondansetron (ZOFRAN ODT) 4 MG disintegrating tablet Take 1 tablet (4 mg total) by mouth every 8 (eight) hours as needed for nausea or vomiting. 06/25/16   Rudene Re, MD  ondansetron (ZOFRAN ODT) 4 MG disintegrating tablet Take 1 tablet (4 mg total) by mouth every 8 (eight) hours as needed for nausea or vomiting. 09/15/17   Loney Hering, MD  orphenadrine (NORFLEX) 100 MG tablet Take 1 tablet (100 mg total) by mouth 2 (two) times daily. 02/14/16   Frederich Cha, MD  oseltamivir (TAMIFLU) 75 MG capsule Take 1 capsule (75 mg total) by mouth  every 12 (twelve) hours. 09/28/16   Marylene Land, NP  oxyCODONE-acetaminophen (ROXICET) 5-325 MG tablet Take 1 tablet by mouth every 4 (four) hours as needed for severe pain. 06/25/16   Alfred Levins, [place], MD  sitaGLIPtin (JANUVIA) 100 MG tablet Take 100 mg by mouth daily.    [provider]  sitaGLIPtin (JANUVIA) 50 MG tablet Take 50 mg by mouth daily.    [provider]  sodium chloride (OCEAN) 0.65 % SOLN nasal spray Place 2 sprays into both nostrils every 2 (two) hours while awake. Patient not taking: Reported on 02/12/2016 10/17/15   Olen Cordial, NP  tamsulosin (FLOMAX) 0.4 MG CAPS capsule Take 1 capsule (0.4 mg total) by mouth daily. 04/09/16   Cuthriell, Charline Bills, PA-C  tamsulosin (FLOMAX) 0.4 MG CAPS capsule Take 1 capsule (0.4 mg total) by mouth daily. 09/15/17   Loney Hering, MD  traMADol (ULTRAM) 50 MG tablet Take 1 tablet (50 mg total) by mouth every 6 (six) hours as needed. 04/09/16   Cuthriell, Charline Bills, PA-C  traMADol (ULTRAM) 50 MG tablet Take 1 tablet (50 mg total) by mouth every 8 (eight) hours as needed (Do not drive or operate machinery while taking as can cause drowsiness.). 12/30/16   Marylene Land, NP    Allergies Codeine and Sulfa antibiotics  Family History  Problem Relation Age of Onset  . Stroke Father   . Kidney failure Father     Social History Social History   Tobacco Use  . Smoking status: Never Smoker  . Smokeless tobacco: Never Used  Substance Use Topics  . Alcohol use: No    Alcohol/week: 0.0 oz  . Drug use: No    Review of Systems  Constitutional: No fever/chills Eyes: No visual changes. ENT: No sore throat. Cardiovascular: Denies chest pain. Respiratory: Denies shortness of breath. Gastrointestinal: No abdominal pain.  No nausea, no vomiting.  No diarrhea.  No constipation. Genitourinary: Negative for dysuria. Musculoskeletal: Right flank pain Skin: Negative for rash. Neurological: Negative for headaches,  focal weakness or numbness.   ____________________________________________   PHYSICAL EXAM:  VITAL SIGNS: ED Triage Vitals  Enc Vitals Group     BP 09/15/17 0120 (!) 142/92     Pulse Rate 09/15/17 0120 93     Resp 09/15/17 0120 20     Temp 09/15/17 0120 98.4 F (36.9 C)     Temp Source 09/15/17 0120 Oral     SpO2 09/15/17 0120 99 %     Weight 09/15/17 0119 284 lb (128.8 kg)     Height 09/15/17 0119 5\' 5"  (1.651 m)     Head Circumference --      Peak Flow --      Pain Score 09/15/17 0119 10     Pain Loc --  Pain Edu? --      Excl. in [place]? --     Constitutional: Alert and oriented. Well appearing and in mild distress. Eyes: Conjunctivae are normal. PERRL. EOMI. Head: Atraumatic. Nose: No congestion/rhinnorhea. Mouth/Throat: Mucous membranes are moist.  Oropharynx non-erythematous. Cardiovascular: Normal rate, regular rhythm. Grossly normal heart sounds.  Good peripheral circulation. Respiratory: Normal respiratory effort.  No retractions. Lungs CTAB. Gastrointestinal: Soft and nontender. No distention. Positive bowel sounds Musculoskeletal: No lower extremity tenderness nor edema.   Neurologic:  Normal speech and language.  Skin:  Skin is warm, dry and intact.  Psychiatric: Mood and affect are normal.   ____________________________________________   LABS (all labs ordered are listed, but only abnormal results are displayed)  Labs Reviewed  URINALYSIS, COMPLETE (UACMP) WITH MICROSCOPIC - Abnormal; Notable for the following components:      Result Value   Color, Urine YELLOW (*)    APPearance CLEAR (*)    All other components within normal limits  BASIC METABOLIC PANEL - Abnormal; Notable for the following components:   Chloride 100 (*)    Glucose, Bld 171 (*)    Creatinine, Ser 1.28 (*)    GFR calc non Af Amer 46 (*)    GFR calc Af Amer 54 (*)    All other components within normal limits  CBC    ____________________________________________  EKG  none ____________________________________________  RADIOLOGY  No results found.  ____________________________________________   PROCEDURES  Procedure(s) performed: None  Procedures  Critical Care performed: No  ____________________________________________   INITIAL IMPRESSION / ASSESSMENT AND PLAN / ED COURSE  As part of my medical decision making, I reviewed the following data within the electronic MEDICAL RECORD NUMBER Notes from prior ED visits and [place] Controlled Substance Database   This is a 57 year old female who comes into the hospital today with some right flank pain.  The patient states that this is similar to her previous stones.  My differential diagnosis includes kidney stone, urinary tract infection, muscular skeletal pain.  We did give the patient a dose of morphine and Zofran when she initially arrived in the emergency department.  She also received a liter of normal saline.  Her pain was improved after the medicine.  We checked some blood work to include a BMP a CBC and a urinalysis.  The patient's blood work is unremarkable aside from a creatinine of 1.28.  I discussed with the patient if she wanted another CT scan given her history and she said that it felt like a kidney stone and she was confident that this was a kidney stone.  I gave the patient a dose of oral Percocet as well as a dose of Toradol.  The patient's pain did not return while she was in the emergency department.  We gave her some fluids and encouraged her to follow-up with urology.  I will give the patient some Flomax and she does have a prescription for tramadol as well.  She will be discharged home to follow-up with her urologist.      ____________________________________________   FINAL CLINICAL IMPRESSION(S) / ED DIAGNOSES  Final diagnoses:  Right flank pain  Kidney stone     ED Discharge Orders        Ordered    tamsulosin  (FLOMAX) 0.4 MG CAPS capsule  Daily     09/15/17 0421    ondansetron (ZOFRAN ODT) 4 MG disintegrating tablet  Every 8 hours PRN     09/15/17 0421  Note:  This document was prepared using Dragon voice recognition software and may include unintentional dictation errors.    Loney Hering, MD 09/15/17 [phone]

## 2017-09-15 NOTE — ED Notes (Signed)
Pt reports she is feeling much better. Had been up to the restroom and urine collected. Tolerated well. No distress noted. Iv fluids infusing. Will continue to assess.

## 2017-09-15 NOTE — Discharge Instructions (Signed)
PLease continue drinking adequate hydration. Please follow up with urology. Please return with any worsened condition

## 2017-09-15 NOTE — ED Notes (Signed)
Dr. Webster at the bedside for pt evaluation 

## 2017-09-15 NOTE — ED Triage Notes (Signed)
Pt in with co right flank pain that started tonight hx of kidney stones states feels the same.

## 2017-09-23 IMAGING — CT CT RENAL STONE PROTOCOL
2 of 4 series · 16 of 46 positions shown, 18 images · non-contrast
Comparison: CT 04/09/2016

CLINICAL DATA: Right flank pain.

EXAM:
CT ABDOMEN AND PELVIS WITHOUT CONTRAST
TECHNIQUE: Multidetector CT imaging of the abdomen and pelvis was performed
following the standard protocol without IV contrast.

[Series 2: axial st · axial · 0.92mm/px · z∈[-766,-316]mm · 13 of 100 slices shown, 15 images]
[im 5/100  soft-tissue]
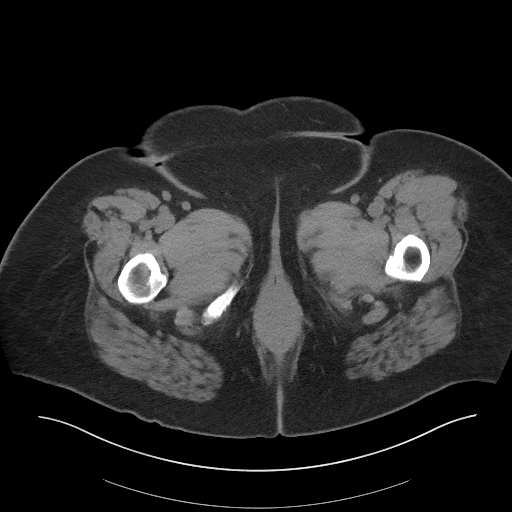
[im 5/100  bone]
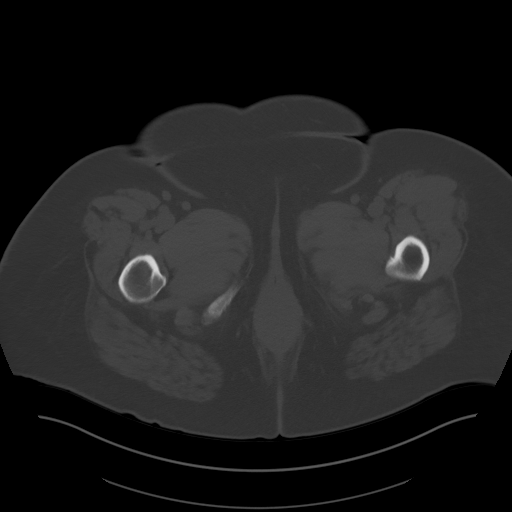
[im 13/100  soft-tissue]
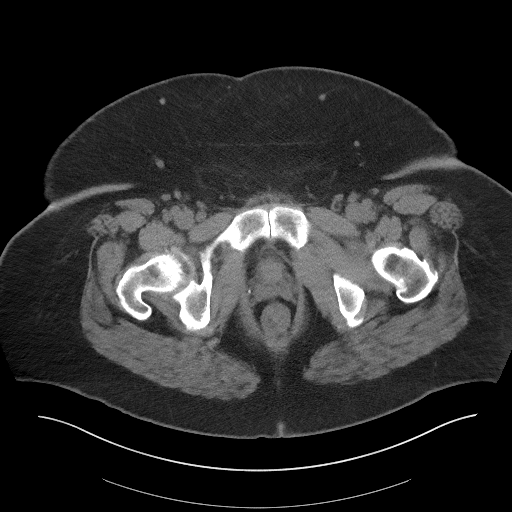
[im 22/100  soft-tissue]
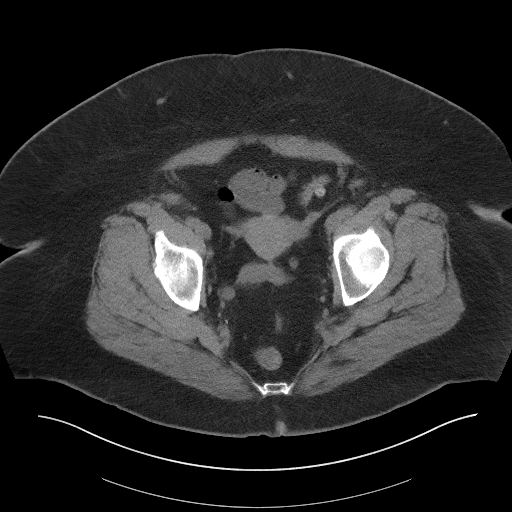
[im 26/100  soft-tissue]
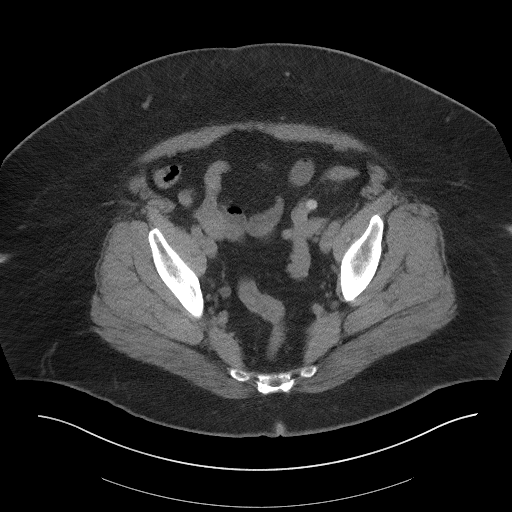
[im 35/100  soft-tissue]
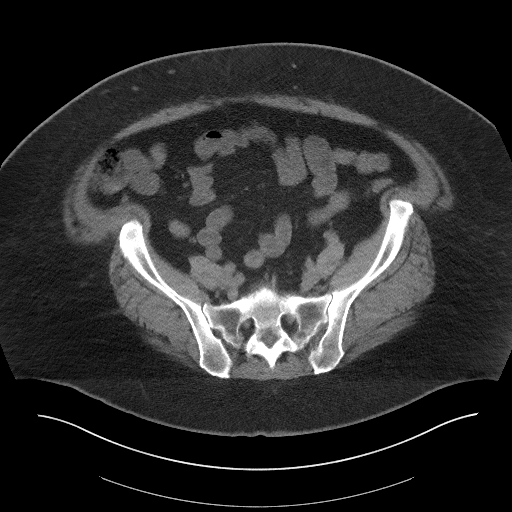
[im 44/100  soft-tissue]
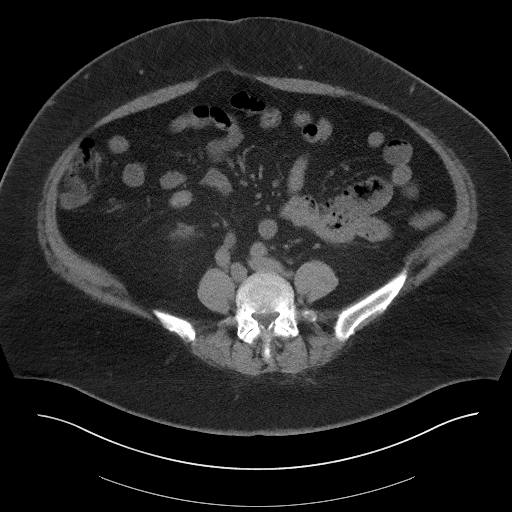
[im 52/100  soft-tissue]
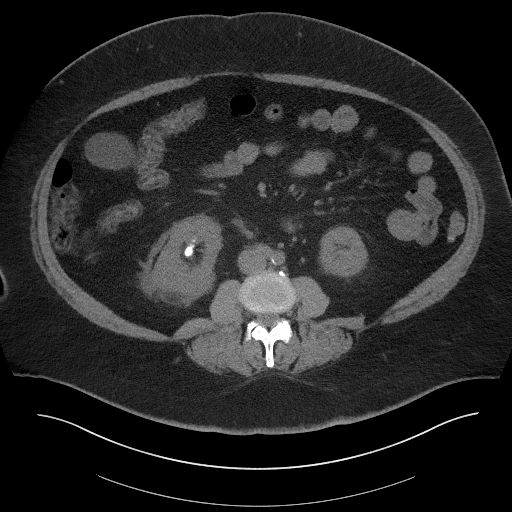
[im 56/100  soft-tissue]
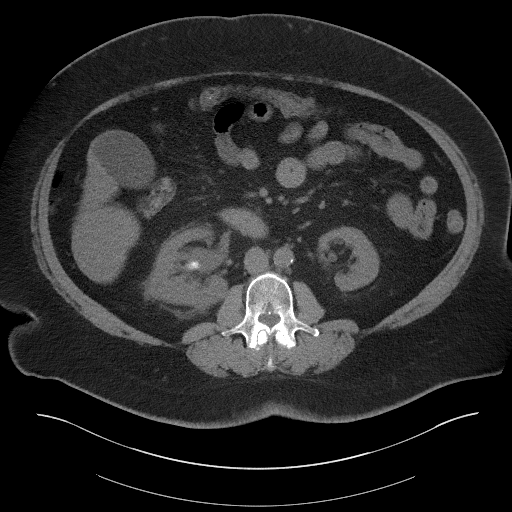
[im 65/100  soft-tissue]
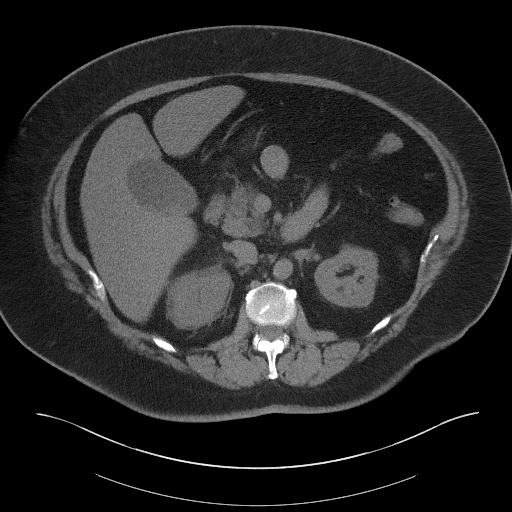
[im 65/100  bone]
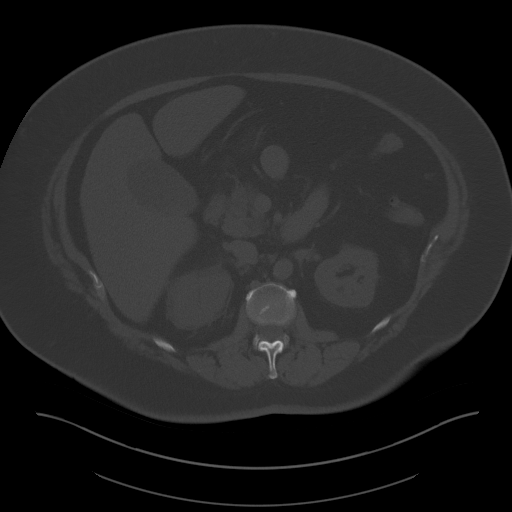
[im 74/100  soft-tissue]
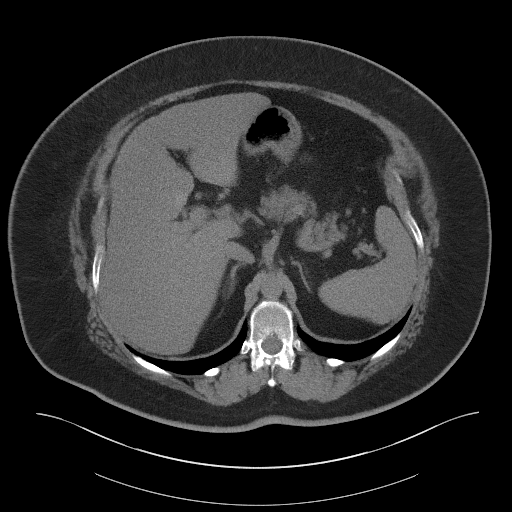
[im 78/100  soft-tissue]
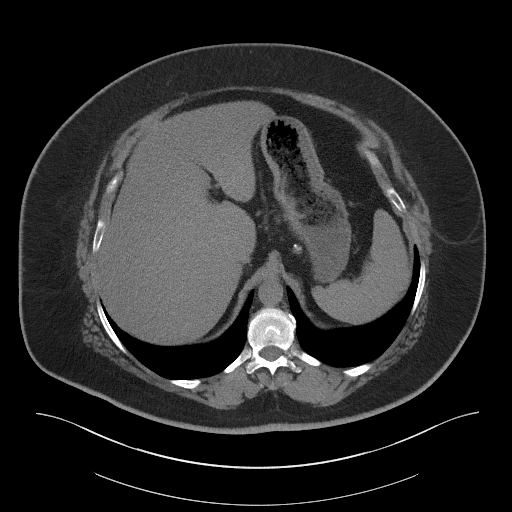
[im 87/100  soft-tissue]
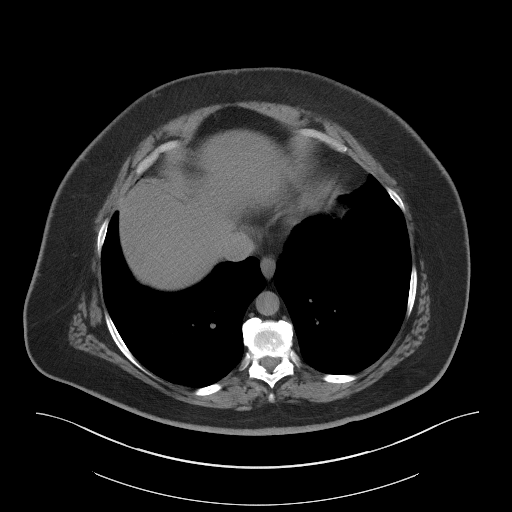
[im 95/100  soft-tissue]
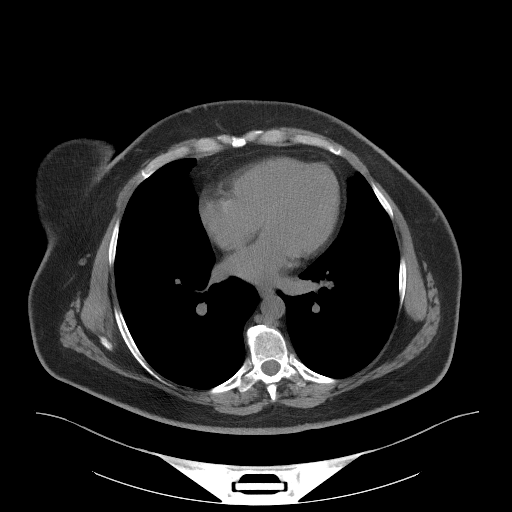

[Series 5: coronal · coronal · 0.83mm/px · 3 of 188 slices shown]
[im 63/188  soft-tissue]
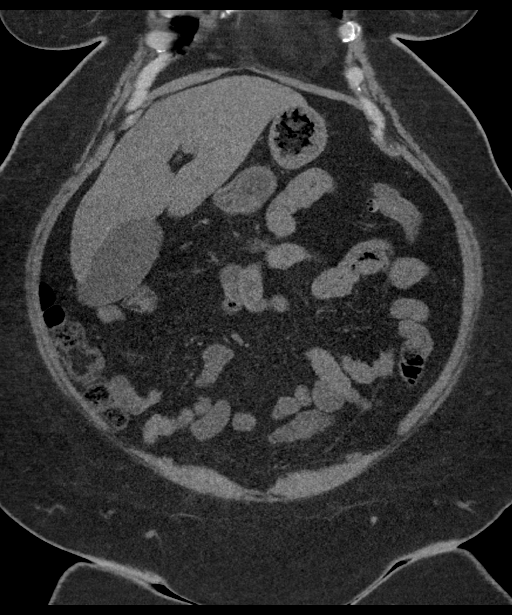
[im 84/188  soft-tissue]
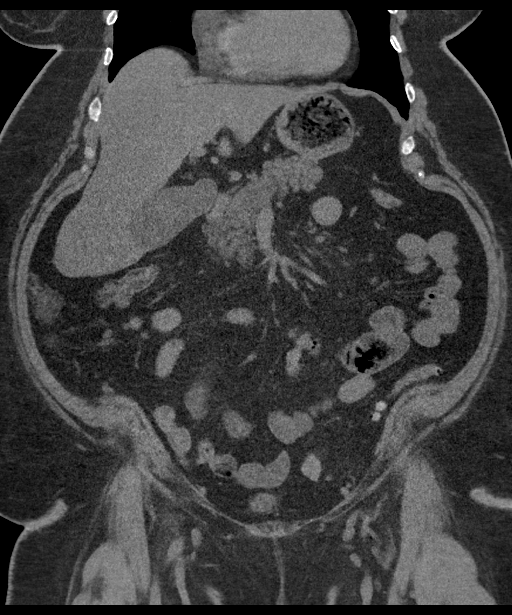
[im 104/188  soft-tissue]
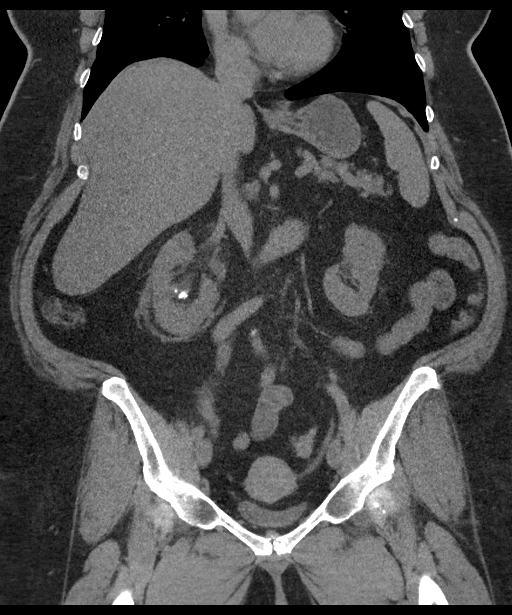

[16 of 46 positions shown; findings below may reference images not displayed]

FINDINGS: Lower chest: The lung bases are clear.

Hepatobiliary: The liver is enlarged spanning 22 cm craniocaudal
with steatosis. No evidence of focal lesion. Gallbladder appears
distended with intraluminal sludge or dependent stones. Hyperdense
focus at the gallbladder neck may reflect a calcified stone, however
is unchanged in appearance dating back to 1024. No pericholecystic
inflammation. There is no biliary dilatation.

Pancreas: No ductal dilatation or inflammation.

Spleen: Normal.

Adrenals/Urinary Tract: Obstructing 5 mm stone at the right
ureterovesicular junction with moderate hydroureteronephrosis. There
is moderate right perinephric edema. Multiple additional
nonobstructing stones in the right kidney including a 15 mm lower
pole calculus. Two nonobstructing stones in the lower left kidney,
no left hydronephrosis or hydroureter. The bladder is decompressed.
The adrenal glands are normal.

Stomach/Bowel: Descending and sigmoid colonic diverticulosis without
acute inflammation. The appendix is normal. No bowel dilatation.
Small hiatal hernia.

Vascular/Lymphatic: Aortic atherosclerosis. No enlarged abdominal or
pelvic lymph nodes.

Reproductive: Uterus and bilateral adnexa are unremarkable.

Other: Fat containing umbilical hernia.  No free air or free fluid.

Musculoskeletal: No acute or significant osseous findings.
Degenerative disc disease at L5-S1.
IMPRESSION: 1. Obstructing 5 mm stone at the right ureterovesicular junction
with moderate hydronephrosis and perinephric edema.
2. Additional nonobstructing bilateral renal calculi.
3. Hepatomegaly and hepatic steatosis.
4. Probable cholelithiasis.

## 2017-09-25 ENCOUNTER — Encounter: Payer: Self-pay | Admitting: Gynecology

## 2017-09-25 ENCOUNTER — Other Ambulatory Visit: Payer: Self-pay

## 2017-09-25 ENCOUNTER — Ambulatory Visit
Admission: EM | Admit: 2017-09-25 | Discharge: 2017-09-25 | Disposition: A | Discharge status: Home / Self Care | Payer: Managed Care, Other (non HMO) | Attending: Family Medicine | Admitting: Family Medicine

## 2017-09-25 DIAGNOSIS — R300 Dysuria: Secondary | ICD-10-CM | POA: Diagnosis not present

## 2017-09-25 DIAGNOSIS — R350 Frequency of micturition: Secondary | ICD-10-CM | POA: Diagnosis not present

## 2017-09-25 LAB — URINALYSIS, COMPLETE (UACMP) WITH MICROSCOPIC
Glucose, UA: NEGATIVE mg/dL
HGB URINE DIPSTICK: NEGATIVE
KETONES UR: NEGATIVE mg/dL
Leukocytes, UA: NEGATIVE
NITRITE: NEGATIVE
PROTEIN: NEGATIVE mg/dL
RBC / HPF: NONE SEEN RBC/hpf (ref 0–5)
SPECIFIC GRAVITY, URINE: 1.025 (ref 1.005–1.030)
pH: 5.5 (ref 5.0–8.0)

## 2017-09-25 MED ORDER — PHENAZOPYRIDINE HCL 200 MG PO TABS: 200.0000 mg | ORAL_TABLET | Freq: Three times a day (TID) | ORAL | 0 refills | Status: DC | PRN

## 2017-09-25 NOTE — Discharge Instructions (Signed)
Please make sure you are drinking lots of fluids.  Take Pyridium as prescribed.  Return to the clinic or emergency department for any fevers, pain or any worsening urinary symptoms.

## 2017-09-25 NOTE — ED Triage Notes (Signed)
Patient c/o x 1 week ago burning with urination.

## 2017-09-25 NOTE — ED Provider Notes (Signed)
[facility]    CSN: [id number] Arrival date & time: 09/25/17  1021     History   Chief Complaint Chief Complaint  Patient presents with  . Urinary Tract Infection    HPI Joy Roberts is a 57 y.o. female presents to the urgent care facility for evaluation of urinary tract infection.  The patient was diagnosed with kidney stone on 09/15/2017, passed the kidney stone and was doing well up until 7 days ago noticing intermittent increase in urinary frequency with intermittent burning with urination.  She denies any abdominal, back or flank pain.  No fevers nausea or vomiting.  Pain is moderate with urination.  No abdominal pain.  HPI  Past Medical History:  Diagnosis Date  . Asthma   . Diabetes ([place])   . Hypertension   . Kidney stones   . Osteoarthritis     There are no active problems to display for this patient.   Past Surgical History:  Procedure Laterality Date  . CESAREAN SECTION  2003    OB History    No data available       Home Medications    Prior to Admission medications   Medication Sig Start Date End Date Taking? Authorizing Provider  albuterol (PROVENTIL) (2.5 MG/3ML) 0.083% nebulizer solution Take 3 mLs (2.5 mg total) by nebulization every 6 (six) hours as needed for wheezing or shortness of breath. 09/28/16  Yes Marylene Land, NP  amLODipine-benazepril (LOTREL) 10-20 MG capsule Take 1 capsule by mouth daily.   Yes [provider]  atorvastatin (LIPITOR) 10 MG tablet Take 10 mg by mouth daily.   Yes [provider]  budesonide (PULMICORT) 0.5 MG/2ML nebulizer solution Take 2 mLs (0.5 mg total) by nebulization every 6 (six) hours as needed. 10/17/15 09/25/17 Yes Betancourt, Aura Fey, NP  etodolac (LODINE) 500 MG tablet Take 500 mg by mouth 2 (two) times daily.   Yes [provider]  hydrochlorothiazide (HYDRODIURIL) 25 MG tablet Take 25 mg by mouth daily.   Yes [provider]  ketorolac (TORADOL) 10 MG  tablet Take 1 tablet (10 mg total) by mouth every 6 (six) hours as needed. 04/09/16  Yes Cuthriell, Charline Bills, PA-C  metFORMIN (GLUCOPHAGE) 500 MG tablet Take 1,000 mg by mouth 4 (four) times daily.    Yes [provider]  ondansetron (ZOFRAN ODT) 4 MG disintegrating tablet Take 1 tablet (4 mg total) by mouth every 8 (eight) hours as needed for nausea or vomiting. 06/25/16  Yes Veronese, [place], MD  sitaGLIPtin (JANUVIA) 100 MG tablet Take 100 mg by mouth daily.   Yes [provider]  tamsulosin (FLOMAX) 0.4 MG CAPS capsule Take 1 capsule (0.4 mg total) by mouth daily. 09/15/17  Yes Loney Hering, MD  benzonatate (TESSALON) 200 MG capsule Take 1 capsule (200 mg total) by mouth 3 (three) times daily as needed for cough. Patient not taking: Reported on 02/12/2016 08/31/15   Wynona Luna, MD  celecoxib (CELEBREX) 400 MG capsule Take 1 capsule (400 mg total) by mouth daily after breakfast. 02/14/16   Frederich Cha, MD  fluticasone [facility]) 50 MCG/ACT nasal spray Place 1 spray into both nostrils 2 (two) times daily. Patient not taking: Reported on 02/12/2016 10/17/15   Olen Cordial, NP  loratadine (CLARITIN) 10 MG tablet Take 1 tablet (10 mg total) by mouth daily. 10/17/15   Betancourt, Aura Fey, NP  meloxicam (MOBIC) 15 MG tablet Take 15 mg by mouth daily. Reported on 02/12/2016  [provider]  ondansetron (ZOFRAN ODT) 4 MG disintegrating tablet Take 1 tablet (4 mg total) by mouth every 8 (eight) hours as needed for nausea or vomiting. 09/15/17   Loney Hering, MD  orphenadrine (NORFLEX) 100 MG tablet Take 1 tablet (100 mg total) by mouth 2 (two) times daily. 02/14/16   Frederich Cha, MD  oseltamivir (TAMIFLU) 75 MG capsule Take 1 capsule (75 mg total) by mouth every 12 (twelve) hours. 09/28/16   Marylene Land, NP  oxyCODONE-acetaminophen (ROXICET) 5-325 MG tablet Take 1 tablet by mouth every 4 (four) hours as needed for severe pain. 06/25/16   Rudene Re,  MD  phenazopyridine (PYRIDIUM) 200 MG tablet Take 1 tablet (200 mg total) by mouth 3 (three) times daily as needed for pain. 09/25/17   Duanne Guess, PA-C  sitaGLIPtin (JANUVIA) 50 MG tablet Take 50 mg by mouth daily.    [provider]  sodium chloride (OCEAN) 0.65 % SOLN nasal spray Place 2 sprays into both nostrils every 2 (two) hours while awake. Patient not taking: Reported on 02/12/2016 10/17/15   Olen Cordial, NP  tamsulosin (FLOMAX) 0.4 MG CAPS capsule Take 1 capsule (0.4 mg total) by mouth daily. 04/09/16   Cuthriell, Charline Bills, PA-C  traMADol (ULTRAM) 50 MG tablet Take 1 tablet (50 mg total) by mouth every 6 (six) hours as needed. 04/09/16   Cuthriell, Charline Bills, PA-C  traMADol (ULTRAM) 50 MG tablet Take 1 tablet (50 mg total) by mouth every 8 (eight) hours as needed (Do not drive or operate machinery while taking as can cause drowsiness.). 12/30/16   Marylene Land, NP    Family History Family History  Problem Relation Age of Onset  . Stroke Father   . Kidney failure Father     Social History Social History   Tobacco Use  . Smoking status: Never Smoker  . Smokeless tobacco: Never Used  Substance Use Topics  . Alcohol use: No    Alcohol/week: 0.0 oz  . Drug use: No     Allergies   Codeine and Sulfa antibiotics   Review of Systems Review of Systems  Constitutional: Negative for fever.  Respiratory: Negative for shortness of breath.   Cardiovascular: Negative for chest pain.  Gastrointestinal: Negative for abdominal pain.  Genitourinary: Positive for dysuria. Negative for difficulty urinating, flank pain, hematuria and urgency.  Musculoskeletal: Negative for back pain and myalgias.  Skin: Negative for rash.  Neurological: Negative for dizziness and headaches.     Physical Exam Triage Vital Signs ED Triage Vitals  Enc Vitals Group     BP 09/25/17 1103 119/66     Pulse Rate 09/25/17 1103 (!) 109     Resp 09/25/17 1103 16     Temp 09/25/17  1103 98.6 F (37 C)     Temp Source 09/25/17 1103 Oral     SpO2 09/25/17 1103 97 %     Weight 09/25/17 1056 284 lb (128.8 kg)     Height --      Head Circumference --      Peak Flow --      Pain Score 09/25/17 1056 2     Pain Loc --      Pain Edu? --      Excl. in [place]? --    No data found.  Updated Vital Signs BP 119/66 (BP Location: Left Arm)   Pulse (!) 109   Temp 98.6 F (37 C) (Oral)   Resp 16   Wt  284 lb (128.8 kg)   SpO2 97%   BMI 47.26 kg/m   Visual Acuity Right Eye Distance:   Left Eye Distance:   Bilateral Distance:    Right Eye Near:   Left Eye Near:    Bilateral Near:     Physical Exam  Constitutional: She is oriented to person, place, and time. She appears well-developed and well-nourished.  HENT:  Head: Normocephalic and atraumatic.  Eyes: Conjunctivae are normal.  Neck: Normal range of motion.  Cardiovascular: Normal rate.  Pulmonary/Chest: Effort normal. No respiratory distress.  Abdominal: Soft. Bowel sounds are normal. She exhibits no distension. There is no tenderness. There is no guarding.  No CVA tenderness  Musculoskeletal: Normal range of motion.  Neurological: She is alert and oriented to person, place, and time.  Skin: Skin is warm. No rash noted.  Psychiatric: She has a normal mood and affect. Her behavior is normal. Thought content normal.     UC Treatments / Results  Labs (all labs ordered are listed, but only abnormal results are displayed) Labs Reviewed  URINALYSIS, COMPLETE (UACMP) WITH MICROSCOPIC - Abnormal; Notable for the following components:      Result Value   APPearance HAZY (*)    Bilirubin Urine LARGE (*)    Squamous Epithelial / LPF 6-30 (*)    Bacteria, UA RARE (*)    All other components within normal limits    EKG  EKG Interpretation None       Radiology No results found.  Procedures Procedures (including critical care time)  Medications Ordered in UC Medications - No data to display   Initial  Impression / Assessment and Plan / UC Course  I have reviewed the triage vital signs and the nursing notes.  Pertinent labs & imaging results that were available during my care of the patient were reviewed by me and considered in my medical decision making (see chart for details).     57 year old female with intermittent increase in urinary frequency and intermittent burning with urination.  Urinalysis without blood or WBC.  There was elevation of bilirubin in the urine which has been present in the past.  Previous CT scans shows sludge throughout the gallbladder with cholelithiasis.  Recommend patient discuss this with PCP.  Patient is encouraged to drink lots of fluids and start Pyridium for bladder pain with urination.  She is educated on signs and symptoms return to the clinic for.  Final Clinical Impressions(s) / UC Diagnoses   Final diagnoses:  Dysuria    ED Discharge Orders        Ordered    phenazopyridine (PYRIDIUM) 200 MG tablet  3 times daily PRN     09/25/17 1141        Duanne Guess, [place] 09/25/17 1144

## 2017-09-26 ENCOUNTER — Other Ambulatory Visit: Payer: Self-pay | Admitting: Internal Medicine

## 2017-09-26 DIAGNOSIS — J4520 Mild intermittent asthma, uncomplicated: Secondary | ICD-10-CM | POA: Insufficient documentation

## 2017-09-26 DIAGNOSIS — Z6841 Body Mass Index (BMI) 40.0 and over, adult: Secondary | ICD-10-CM | POA: Insufficient documentation

## 2017-09-26 DIAGNOSIS — N200 Calculus of kidney: Secondary | ICD-10-CM | POA: Insufficient documentation

## 2017-09-28 ENCOUNTER — Telehealth: Payer: Self-pay | Admitting: Emergency Medicine

## 2017-09-28 ENCOUNTER — Ambulatory Visit: Payer: Managed Care, Other (non HMO) | Admitting: Internal Medicine

## 2017-09-28 ENCOUNTER — Encounter: Payer: Self-pay | Admitting: Internal Medicine

## 2017-09-28 VITALS — BP 100/62 | HR 86 | Ht 65.0 in | Wt 289.0 lb

## 2017-09-28 DIAGNOSIS — N200 Calculus of kidney: Secondary | ICD-10-CM

## 2017-09-28 DIAGNOSIS — I10 Essential (primary) hypertension: Secondary | ICD-10-CM

## 2017-09-28 DIAGNOSIS — J4520 Mild intermittent asthma, uncomplicated: Secondary | ICD-10-CM | POA: Diagnosis not present

## 2017-09-28 DIAGNOSIS — Z23 Encounter for immunization: Secondary | ICD-10-CM

## 2017-09-28 DIAGNOSIS — L438 Other lichen planus: Secondary | ICD-10-CM

## 2017-09-28 DIAGNOSIS — E119 Type 2 diabetes mellitus without complications: Secondary | ICD-10-CM | POA: Diagnosis not present

## 2017-09-28 DIAGNOSIS — M150 Primary generalized (osteo)arthritis: Secondary | ICD-10-CM | POA: Diagnosis not present

## 2017-09-28 DIAGNOSIS — M159 Polyosteoarthritis, unspecified: Secondary | ICD-10-CM

## 2017-09-28 DIAGNOSIS — Z1231 Encounter for screening mammogram for malignant neoplasm of breast: Secondary | ICD-10-CM | POA: Diagnosis not present

## 2017-09-28 DIAGNOSIS — Z1239 Encounter for other screening for malignant neoplasm of breast: Secondary | ICD-10-CM

## 2017-09-28 NOTE — Telephone Encounter (Signed)
Called patient to follow-up to see how see was doing since her recent visit at [facility].  Patient states that she is doing better and that she is scheduled to see her PCP today for follow-up care.

## 2017-09-28 NOTE — Progress Notes (Signed)
Date:  09/28/2017   Name:  Joy Roberts   DOB:  [birth year]   MRN:  [id number]   Chief Complaint: Establish Care and Arthritis (mother had rhuematoid and she has never been tested. Wanted to be tested. Was told arthiritis in neck, shoulders, hips and knees. Seen [facility] in the past for this. ) Diabetes  She presents for her follow-up diabetic visit. She has type 2 diabetes mellitus. Onset time: onset 2012. Her disease course has been stable. Pertinent negatives for hypoglycemia include no dizziness or headaches. Pertinent negatives for diabetes include no fatigue. Her weight is stable. She monitors blood glucose at home 3-4 x per week. Her breakfast blood glucose is taken between 8-9 am. Her breakfast blood glucose range is generally 140-180 mg/dl. An ACE inhibitor/angiotensin II receptor blocker is being taken.  Hypertension  This is a chronic problem. The problem is controlled. Pertinent negatives include no headaches. Past treatments include ACE inhibitors and calcium channel blockers. The current treatment provides significant improvement.  Flank Pain  This is a recurrent (passed a stone earlier this month then developed UTI.) problem. The problem has been resolved since onset. Pertinent negatives include no abdominal pain, dysuria or headaches.  Arthritis  Presents for follow-up visit. She reports no joint swelling. Affected locations include the left shoulder, right shoulder, left knee, right knee, right hip and left DIP. Pertinent negatives include no diarrhea, dysuria, fatigue or rash.  Asthma  She complains of chest tightness. There is no cough or wheezing. This is a recurrent problem. The problem occurs rarely. Pertinent negatives include no headaches or trouble swallowing. Her symptoms are alleviated by beta-agonist. Her past medical history is significant for asthma.  HM - she has never had a mammogram or a colonoscopy.  No fam hx of colon or breast cancer.  She has noticed on a  CT scan in 2017 mention of hiatal hernia and fat containing umbilical hernia.  No one has ever explained these to her.  She denies abd pain, mass or bulge, significant reflux or dysphagia.  Sometimes feels that food sticks briefly in the upper esophagus.  Review of Systems  Constitutional: Negative for chills, fatigue and unexpected weight change.  HENT: Positive for mouth sores (intermittently). Negative for trouble swallowing.   Eyes: Negative for visual disturbance.  Respiratory: Negative for cough, wheezing and stridor.   Gastrointestinal: Negative for abdominal pain, constipation and diarrhea.  Genitourinary: Positive for flank pain. Negative for dysuria and urgency.  Musculoskeletal: Positive for arthralgias and arthritis. Negative for gait problem and joint swelling.  Skin: Negative for color change and rash.  Neurological: Negative for dizziness, light-headedness and headaches.  Hematological: Negative for adenopathy.  Psychiatric/Behavioral: Negative for dysphoric mood and sleep disturbance.    Patient Active Problem List   Diagnosis Date Noted  . Mild intermittent asthma without complication 40/98/1191  . Calculus of kidney 09/26/2017  . Obesity, unspecified 09/26/2017  . Osteoarthritis of multiple joints 03/31/2016  . Controlled type 2 diabetes mellitus without complication, without long-term current use of insulin ([place]) 03/31/2016  . Essential hypertension 09/10/2014  . Varicose veins of both lower extremities 03/27/2014  . Hyperlipidemia, unspecified 05/10/2012    Prior to Admission medications   Medication Sig Start Date End Date Taking? Authorizing Provider  albuterol (PROVENTIL) (2.5 MG/3ML) 0.083% nebulizer solution Take 3 mLs (2.5 mg total) by nebulization every 6 (six) hours as needed for wheezing or shortness of breath. 09/28/16  Yes Marylene Land, NP  amLODipine-benazepril (LOTREL) 10-20 MG  capsule Take 1 capsule by mouth daily.   Yes [provider]    aspirin EC 81 MG tablet Take by mouth.   Yes [provider]  atorvastatin (LIPITOR) 10 MG tablet Take 10 mg by mouth daily.   Yes [provider]  budesonide (PULMICORT) 0.5 MG/2ML nebulizer solution Take 2 mLs (0.5 mg total) by nebulization every 6 (six) hours as needed. 10/17/15 09/28/17 Yes Betancourt, Aura Fey, NP  etodolac (LODINE) 500 MG tablet Take 500 mg by mouth 2 (two) times daily.   Yes [provider]  glipiZIDE (GLUCOTROL XL) 2.5 MG 24 hr tablet Take 2.5 mg by mouth daily. 05/31/17 05/31/18 Yes [provider]  hydrochlorothiazide (HYDRODIURIL) 25 MG tablet Take 25 mg by mouth daily.   Yes [provider]  metFORMIN (GLUCOPHAGE-XR) 500 MG 24 hr tablet Take 2 tablets by mouth 2 (two) times daily. 05/31/17 05/31/18 Yes [provider]  phenazopyridine (PYRIDIUM) 200 MG tablet Take 1 tablet (200 mg total) by mouth 3 (three) times daily as needed for pain. 09/25/17  Yes Duanne Guess, PA-C  sitaGLIPtin (JANUVIA) 100 MG tablet Take 1 tablet by mouth daily.   Yes [provider]  traMADol (ULTRAM) 50 MG tablet Take 1 tablet (50 mg total) by mouth every 8 (eight) hours as needed (Do not drive or operate machinery while taking as can cause drowsiness.). 12/30/16  Yes Marylene Land, NP    Allergies  Allergen Reactions  . Codeine Nausea And Vomiting  . Sulfa Antibiotics Nausea And Vomiting    Past Surgical History:  Procedure Laterality Date  . CESAREAN SECTION  2003  . LITHOTRIPSY  01/11/2014    Social History   Tobacco Use  . Smoking status: Never Smoker  . Smokeless tobacco: Never Used  Substance Use Topics  . Alcohol use: No    Alcohol/week: 0.0 oz  . Drug use: No     Medication list has been reviewed and updated.  PHQ 2/9 Scores 09/28/2017  PHQ - 2 Score 0    Physical Exam  Constitutional: She is oriented to person, place, and time. She appears well-developed. No distress.  HENT:  Head: Normocephalic and  atraumatic.  Cardiovascular: Normal rate, regular rhythm and normal heart sounds.  Pulmonary/Chest: Effort normal and breath sounds normal. No respiratory distress. She has no wheezes.  Abdominal: Soft. Normal appearance and bowel sounds are normal. There is no tenderness.    Musculoskeletal: She exhibits no edema or tenderness.       Right elbow: Normal.      Left elbow: Normal.       Right knee: She exhibits decreased range of motion. She exhibits no effusion.       Left knee: She exhibits decreased range of motion and effusion.  No synovitis of MCP, PIP joints on either hand No joint deformity.  Neurological: She is alert and oriented to person, place, and time. She has normal strength. No sensory deficit.  Skin: Skin is warm, dry and intact. No rash noted.  Psychiatric: She has a normal mood and affect. Her speech is normal and behavior is normal. Thought content normal.  Nursing note and vitals reviewed.   BP 100/62   Pulse 86   Ht 5\' 5"  (1.651 m)   Wt 289 lb (131.1 kg)   SpO2 95%   BMI 48.09 kg/m   Assessment and Plan: 1. Controlled type 2 diabetes mellitus without complication, without long-term current use of insulin (HCC) Continue current medication, including  statin therapy - Hemoglobin A1c - Renal function panel  2. Calculus of kidney Currently asx  3. Essential hypertension controlled  4. Mild intermittent asthma without complication Stable Has had PPV-23 Will give influenza vaccine today  5. Oral lichen planus Stable, mild sx followed by specialist  6. Breast cancer screening Schedule at [place]; Future  7. Need for influenza vaccination - Flu Vaccine QUAD 36+ mos IM  8. Primary osteoarthritis involving multiple joints No evidence of RA Continue nsaids PRN  No orders of the defined types were placed in this encounter.   Partially dictated using Editor, commissioning. Any errors are unintentional.  Halina Maidens,  MD [facility]  09/28/2017

## 2017-09-29 LAB — RENAL FUNCTION PANEL
ALBUMIN: 4.4 g/dL (ref 3.5–5.5)
BUN/Creatinine Ratio: 13 (ref 9–23)
BUN: 26 mg/dL — AB (ref 6–24)
CO2: 24 mmol/L (ref 20–29)
CREATININE: 1.99 mg/dL — AB (ref 0.57–1.00)
Calcium: 9.9 mg/dL (ref 8.7–10.2)
Chloride: 98 mmol/L (ref 96–106)
GFR calc Af Amer: 32 mL/min/{1.73_m2} — ABNORMAL LOW (ref >59)
GFR calc non Af Amer: 28 mL/min/{1.73_m2} — ABNORMAL LOW (ref >59)
GLUCOSE: 87 mg/dL (ref 65–99)
PHOSPHORUS: 4.2 mg/dL (ref 2.5–4.5)
POTASSIUM: 5.1 mmol/L (ref 3.5–5.2)
Sodium: 139 mmol/L (ref 134–144)

## 2017-09-29 LAB — HEMOGLOBIN A1C
Est. average glucose Bld gHb Est-mCnc: 157 mg/dL
Hgb A1c MFr Bld: 7.1 % — ABNORMAL HIGH (ref 4.8–5.6)

## 2017-09-29 NOTE — Progress Notes (Signed)
Patient informed of labs. Stated she passed the largest stone she has ever passed this morning. Kept the stone just incase you wanted to see it. Please Advise. Will call patient with response.

## 2017-10-04 ENCOUNTER — Other Ambulatory Visit: Payer: Self-pay | Admitting: Internal Medicine

## 2017-10-04 DIAGNOSIS — N289 Disorder of kidney and ureter, unspecified: Secondary | ICD-10-CM

## 2017-10-05 LAB — BASIC METABOLIC PANEL
BUN/Creatinine Ratio: 14 (ref 9–23)
BUN: 21 mg/dL (ref 6–24)
CHLORIDE: 101 mmol/L (ref 96–106)
CO2: 24 mmol/L (ref 20–29)
Calcium: 9 mg/dL (ref 8.7–10.2)
Creatinine, Ser: 1.47 mg/dL — ABNORMAL HIGH (ref 0.57–1.00)
GFR calc Af Amer: 46 mL/min/{1.73_m2} — ABNORMAL LOW (ref >59)
GFR calc non Af Amer: 40 mL/min/{1.73_m2} — ABNORMAL LOW (ref >59)
GLUCOSE: 117 mg/dL — AB (ref 65–99)
POTASSIUM: 5.1 mmol/L (ref 3.5–5.2)
SODIUM: 139 mmol/L (ref 134–144)

## 2018-01-26 ENCOUNTER — Ambulatory Visit (INDEPENDENT_AMBULATORY_CARE_PROVIDER_SITE_OTHER): Payer: Managed Care, Other (non HMO) | Admitting: Internal Medicine

## 2018-01-26 ENCOUNTER — Encounter: Payer: Self-pay | Admitting: Internal Medicine

## 2018-01-26 VITALS — BP 104/78 | HR 88 | Resp 16 | Ht 65.0 in | Wt 292.0 lb

## 2018-01-26 DIAGNOSIS — I10 Essential (primary) hypertension: Secondary | ICD-10-CM | POA: Diagnosis not present

## 2018-01-26 DIAGNOSIS — Z0000 Encounter for general adult medical examination without abnormal findings: Secondary | ICD-10-CM | POA: Diagnosis not present

## 2018-01-26 DIAGNOSIS — E1169 Type 2 diabetes mellitus with other specified complication: Secondary | ICD-10-CM

## 2018-01-26 DIAGNOSIS — Z1239 Encounter for other screening for malignant neoplasm of breast: Secondary | ICD-10-CM

## 2018-01-26 DIAGNOSIS — E785 Hyperlipidemia, unspecified: Secondary | ICD-10-CM | POA: Diagnosis not present

## 2018-01-26 DIAGNOSIS — E119 Type 2 diabetes mellitus without complications: Secondary | ICD-10-CM

## 2018-01-26 DIAGNOSIS — M150 Primary generalized (osteo)arthritis: Secondary | ICD-10-CM

## 2018-01-26 DIAGNOSIS — Z1231 Encounter for screening mammogram for malignant neoplasm of breast: Secondary | ICD-10-CM | POA: Diagnosis not present

## 2018-01-26 DIAGNOSIS — M159 Polyosteoarthritis, unspecified: Secondary | ICD-10-CM

## 2018-01-26 LAB — POCT URINALYSIS DIPSTICK
Bilirubin, UA: NEGATIVE
GLUCOSE UA: NEGATIVE
KETONES UA: NEGATIVE
Leukocytes, UA: NEGATIVE
NITRITE UA: NEGATIVE
PROTEIN UA: NEGATIVE
SPEC GRAV UA: 1.02 (ref 1.010–1.025)
Urobilinogen, UA: 0.2 E.U./dL
pH, UA: 7 (ref 5.0–8.0)

## 2018-01-26 MED ORDER — TRAMADOL HCL 50 MG PO TABS: 50.0000 mg | ORAL_TABLET | Freq: Four times a day (QID) | ORAL | 0 refills | Status: DC | PRN

## 2018-01-26 NOTE — Patient Instructions (Signed)

## 2018-01-26 NOTE — Progress Notes (Signed)
Date:  01/26/2018   Name:  Joy Roberts   DOB:  10-Apr-1961   MRN:  [id number]   Chief Complaint: Annual Exam Joy Roberts is a 57 y.o. female who presents today for her Complete Annual Exam. She feels well. She reports exercising very little due to knee pain. She reports she is sleeping well. She needs to schedule mammogram.  She is due for colonoscopy but declines to do that at this time.  Hypertension  This is a chronic problem. The problem is controlled. Pertinent negatives include no chest pain, headaches, palpitations or shortness of breath. Past treatments include ACE inhibitors, calcium channel blockers and diuretics.  Diabetes  She presents for her follow-up diabetic visit. She has type 2 diabetes mellitus. Pertinent negatives for hypoglycemia include no dizziness, headaches, nervousness/anxiousness or tremors. Pertinent negatives for diabetes include no chest pain, no fatigue, no polydipsia and no polyuria. Current diabetic treatment includes oral agent (dual therapy). Her weight is stable. She is following a generally healthy diet. She monitors blood glucose at home 3-4 x per week. Her dinner blood glucose is taken between 7-8 pm. Her dinner blood glucose range is generally 140-180 mg/dl. An ACE inhibitor/angiotensin II receptor blocker is being taken.  Hyperlipidemia  Pertinent negatives include no chest pain or shortness of breath.  Knee Pain   There was no injury mechanism. The pain is present in the left knee and right knee. The pain is moderate. Pertinent negatives include no numbness. Treatments tried: improved after steroid injection - will likely need knee replacement.     Review of Systems  Constitutional: Negative for chills, fatigue and fever.  HENT: Negative for congestion, hearing loss, tinnitus, trouble swallowing and voice change.   Eyes: Negative for visual disturbance.  Respiratory: Negative for cough, chest tightness, shortness of breath and wheezing.     Cardiovascular: Negative for chest pain, palpitations and leg swelling.  Gastrointestinal: Negative for abdominal pain, constipation, diarrhea and vomiting.  Endocrine: Negative for polydipsia and polyuria.  Genitourinary: Negative for dysuria, frequency, genital sores, vaginal bleeding and vaginal discharge.  Musculoskeletal: Positive for arthralgias (both knees, R > L). Negative for gait problem and joint swelling.  Skin: Negative for color change and rash.  Neurological: Negative for dizziness, tremors, light-headedness, numbness and headaches.  Hematological: Negative for adenopathy. Does not bruise/bleed easily.  Psychiatric/Behavioral: Negative for dysphoric mood and sleep disturbance. The patient is not nervous/anxious.     Patient Active Problem List   Diagnosis Date Noted  . Oral lichen planus 38/06/1750  . Mild intermittent asthma without complication 02/58/5277  . Calculus of kidney 09/26/2017  . Obesity, unspecified 09/26/2017  . Osteoarthritis of multiple joints 03/31/2016  . Controlled type 2 diabetes mellitus without complication, without long-term current use of insulin ([place]) 03/31/2016  . Essential hypertension 09/10/2014  . Varicose veins of both lower extremities 03/27/2014  . Umbilical hernia 82/42/3536  . Hyperlipidemia associated with type 2 diabetes mellitus ([place]) 05/10/2012  . Hyperlipidemia 05/10/2012    Prior to Admission medications   Medication Sig Start Date End Date Taking? Authorizing Provider  albuterol (PROVENTIL) (2.5 MG/3ML) 0.083% nebulizer solution Take 3 mLs (2.5 mg total) by nebulization every 6 (six) hours as needed for wheezing or shortness of breath. 09/28/16  Yes Marylene Land, NP  amLODipine-benazepril (LOTREL) 10-20 MG capsule Take 1 capsule by mouth daily.   Yes [provider]  aspirin EC 81 MG tablet Take by mouth.   Yes [provider]  atorvastatin (LIPITOR)  10 MG tablet Take 10 mg by mouth daily.   Yes [provider]      Yes [provider]  glipiZIDE (GLUCOTROL XL) 2.5 MG 24 hr tablet Take 2.5 mg by mouth daily. 05/31/17 05/31/18 Yes [provider]  hydrochlorothiazide (HYDRODIURIL) 25 MG tablet Take 25 mg by mouth daily.   Yes [provider]  metFORMIN (GLUCOPHAGE-XR) 500 MG 24 hr tablet Take 2 tablets by mouth 2 (two) times daily. 05/31/17 05/31/18 Yes [provider]  phenazopyridine (PYRIDIUM) 200 MG tablet Take 1 tablet (200 mg total) by mouth 3 (three) times daily as needed for pain. 09/25/17  Yes Duanne Guess, PA-C  sitaGLIPtin (JANUVIA) 100 MG tablet Take 1 tablet by mouth daily.   Yes [provider]  traMADol (ULTRAM) 50 MG tablet Take 1 tablet (50 mg total) by mouth every 8 (eight) hours as needed (Do not drive or operate machinery while taking as can cause drowsiness.). 12/30/16  Yes Marylene Land, NP  budesonide (PULMICORT) 0.5 MG/2ML nebulizer solution Take 2 mLs (0.5 mg total) by nebulization every 6 (six) hours as needed. 10/17/15 09/28/17  Betancourt, Aura Fey, NP    Allergies  Allergen Reactions  . Codeine Nausea And Vomiting  . Sulfa Antibiotics Nausea And Vomiting    Past Surgical History:  Procedure Laterality Date  . CESAREAN SECTION  2003  . LITHOTRIPSY  01/11/2014    Social History   Tobacco Use  . Smoking status: Never Smoker  . Smokeless tobacco: Never Used  Substance Use Topics  . Alcohol use: No    Alcohol/week: 0.0 oz  . Drug use: No     Medication list has been reviewed and updated.  PHQ 2/9 Scores 01/26/2018 09/28/2017  PHQ - 2 Score 0 0    Physical Exam  Constitutional: She is oriented to person, place, and time. She appears well-developed and well-nourished. No distress.  HENT:  Head: Normocephalic and atraumatic.  Right Ear: Tympanic membrane and ear canal normal.  Left Ear: Tympanic membrane and ear canal normal.  Nose: Right sinus exhibits no maxillary sinus tenderness. Left sinus exhibits  no maxillary sinus tenderness.  Mouth/Throat: Uvula is midline and oropharynx is clear and moist.  Eyes: Conjunctivae and EOM are normal. Right eye exhibits no discharge. Left eye exhibits no discharge. No scleral icterus.  Neck: Normal range of motion. Carotid bruit is not present. No erythema present. No thyromegaly present.  Cardiovascular: Normal rate, regular rhythm, normal heart sounds and normal pulses.  Pulmonary/Chest: Effort normal. No respiratory distress. She has no wheezes. Right breast exhibits no mass, no nipple discharge, no skin change and no tenderness. Left breast exhibits no mass, no nipple discharge, no skin change and no tenderness.  Abdominal: Soft. Bowel sounds are normal. There is no hepatosplenomegaly. There is no tenderness. There is no CVA tenderness.  Musculoskeletal: She exhibits no edema.       Right knee: She exhibits decreased range of motion. She exhibits no effusion.       Left knee: She exhibits decreased range of motion. She exhibits no effusion.  Lymphadenopathy:    She has no cervical adenopathy.    She has no axillary adenopathy.  Neurological: She is alert and oriented to person, place, and time. She has normal reflexes. No cranial nerve deficit or sensory deficit.  Skin: Skin is warm, dry and intact. No rash noted.  Psychiatric: She has a normal mood and affect. Her speech is normal and behavior is normal.  Thought content normal.  Nursing note and vitals reviewed.   BP 104/78   Pulse 88   Resp 16   Ht 5\' 5"  (1.651 m)   Wt 292 lb (132.5 kg)   SpO2 98%   BMI 48.59 kg/m   Assessment and Plan: 1. Annual physical exam Encourage exercise Discussed colonoscopy - POCT urinalysis dipstick  2. Breast cancer screening To schedule mammogram  3. Essential hypertension controlled - CBC with Differential/Platelet  4. Controlled type 2 diabetes mellitus without complication, without long-term current use of insulin (HCC) Doing well on oral agents -  Comprehensive metabolic panel - Hemoglobin A1c - TSH  5. Hyperlipidemia associated with type 2 diabetes mellitus (HCC) Continue statin therapy - Lipid panel  6. Primary osteoarthritis involving multiple joints Will give #20 tramadol to take as needed for severe knee pain - traMADol (ULTRAM) 50 MG tablet; Take 1 tablet (50 mg total) by mouth every 6 (six) hours as needed (Do not drive or operate machinery while taking as can cause drowsiness.).  Dispense: 20 tablet; Refill: 0   Meds ordered this encounter  Medications  . traMADol (ULTRAM) 50 MG tablet    Sig: Take 1 tablet (50 mg total) by mouth every 6 (six) hours as needed (Do not drive or operate machinery while taking as can cause drowsiness.).    Dispense:  20 tablet    Refill:  0    Partially dictated using Editor, commissioning. Any errors are unintentional.  Halina Maidens, MD [facility]  01/26/2018

## 2018-01-27 LAB — COMPREHENSIVE METABOLIC PANEL
ALBUMIN: 4.1 g/dL (ref 3.5–5.5)
ALK PHOS: 88 IU/L (ref 39–117)
ALT: 31 IU/L (ref 0–32)
AST: 19 IU/L (ref 0–40)
Albumin/Globulin Ratio: 1.1 — ABNORMAL LOW (ref 1.2–2.2)
BUN / CREAT RATIO: 14 (ref 9–23)
BUN: 20 mg/dL (ref 6–24)
Bilirubin Total: 0.3 mg/dL (ref 0.0–1.2)
CALCIUM: 9.8 mg/dL (ref 8.7–10.2)
CO2: 25 mmol/L (ref 20–29)
CREATININE: 1.4 mg/dL — AB (ref 0.57–1.00)
Chloride: 98 mmol/L (ref 96–106)
GFR calc Af Amer: 49 mL/min/{1.73_m2} — ABNORMAL LOW (ref >59)
GFR, EST NON AFRICAN AMERICAN: 42 mL/min/{1.73_m2} — AB (ref >59)
GLOBULIN, TOTAL: 3.6 g/dL (ref 1.5–4.5)
GLUCOSE: 147 mg/dL — AB (ref 65–99)
Potassium: 4.4 mmol/L (ref 3.5–5.2)
SODIUM: 142 mmol/L (ref 134–144)
TOTAL PROTEIN: 7.7 g/dL (ref 6.0–8.5)

## 2018-01-27 LAB — LIPID PANEL
Chol/HDL Ratio: 4.6 ratio — ABNORMAL HIGH (ref 0.0–4.4)
Cholesterol, Total: 220 mg/dL — ABNORMAL HIGH (ref 100–199)
HDL: 48 mg/dL (ref >39)
LDL Calculated: 135 mg/dL — ABNORMAL HIGH (ref 0–99)
TRIGLYCERIDES: 184 mg/dL — AB (ref 0–149)
VLDL CHOLESTEROL CAL: 37 mg/dL (ref 5–40)

## 2018-01-27 LAB — CBC WITH DIFFERENTIAL/PLATELET
BASOS ABS: 0 10*3/uL (ref 0.0–0.2)
Basos: 0 %
EOS (ABSOLUTE): 0.2 10*3/uL (ref 0.0–0.4)
EOS: 2 %
HEMATOCRIT: 40.5 % (ref 34.0–46.6)
Hemoglobin: 13.3 g/dL (ref 11.1–15.9)
IMMATURE GRANS (ABS): 0.1 10*3/uL (ref 0.0–0.1)
IMMATURE GRANULOCYTES: 1 %
LYMPHS ABS: 2.2 10*3/uL (ref 0.7–3.1)
LYMPHS: 21 %
MCH: 28.3 pg (ref 26.6–33.0)
MCHC: 32.8 g/dL (ref 31.5–35.7)
MCV: 86 fL (ref 79–97)
Monocytes Absolute: 1 10*3/uL — ABNORMAL HIGH (ref 0.1–0.9)
Monocytes: 9 %
Neutrophils Absolute: 6.9 10*3/uL (ref 1.4–7.0)
Neutrophils: 67 %
Platelets: 416 10*3/uL — ABNORMAL HIGH (ref 150–379)
RBC: 4.7 x10E6/uL (ref 3.77–5.28)
RDW: 14.5 % (ref 12.3–15.4)
WBC: 10.3 10*3/uL (ref 3.4–10.8)

## 2018-01-27 LAB — TSH: TSH: 3.79 u[IU]/mL (ref 0.450–4.500)

## 2018-01-27 LAB — HEMOGLOBIN A1C
Est. average glucose Bld gHb Est-mCnc: 171 mg/dL
Hgb A1c MFr Bld: 7.6 % — ABNORMAL HIGH (ref 4.8–5.6)

## 2018-02-24 ENCOUNTER — Other Ambulatory Visit: Payer: Self-pay

## 2018-02-24 ENCOUNTER — Emergency Department: Payer: Managed Care, Other (non HMO)

## 2018-02-24 ENCOUNTER — Emergency Department
Admission: EM | Admit: 2018-02-24 | Discharge: 2018-02-24 | Disposition: A | Discharge status: Home / Self Care | Payer: Managed Care, Other (non HMO) | Attending: Emergency Medicine | Admitting: Emergency Medicine

## 2018-02-24 DIAGNOSIS — E119 Type 2 diabetes mellitus without complications: Secondary | ICD-10-CM | POA: Insufficient documentation

## 2018-02-24 DIAGNOSIS — N200 Calculus of kidney: Secondary | ICD-10-CM | POA: Insufficient documentation

## 2018-02-24 DIAGNOSIS — R109 Unspecified abdominal pain: Secondary | ICD-10-CM

## 2018-02-24 DIAGNOSIS — Z7984 Long term (current) use of oral hypoglycemic drugs: Secondary | ICD-10-CM | POA: Diagnosis not present

## 2018-02-24 DIAGNOSIS — I10 Essential (primary) hypertension: Secondary | ICD-10-CM | POA: Diagnosis not present

## 2018-02-24 DIAGNOSIS — Z79899 Other long term (current) drug therapy: Secondary | ICD-10-CM | POA: Diagnosis not present

## 2018-02-24 DIAGNOSIS — Z7982 Long term (current) use of aspirin: Secondary | ICD-10-CM | POA: Diagnosis not present

## 2018-02-24 DIAGNOSIS — J45909 Unspecified asthma, uncomplicated: Secondary | ICD-10-CM | POA: Insufficient documentation

## 2018-02-24 DIAGNOSIS — R1032 Left lower quadrant pain: Secondary | ICD-10-CM | POA: Diagnosis present

## 2018-02-24 LAB — CBC WITH DIFFERENTIAL/PLATELET
BASOS ABS: 0 10*3/uL (ref 0–0.1)
BASOS PCT: 0 %
EOS ABS: 0.3 10*3/uL (ref 0–0.7)
EOS PCT: 2 %
HCT: 39.9 % (ref 35.0–47.0)
Hemoglobin: 13.3 g/dL (ref 12.0–16.0)
LYMPHS PCT: 20 %
Lymphs Abs: 2.3 10*3/uL (ref 1.0–3.6)
MCH: 28.5 pg (ref 26.0–34.0)
MCHC: 33.4 g/dL (ref 32.0–36.0)
MCV: 85.5 fL (ref 80.0–100.0)
MONO ABS: 0.9 10*3/uL (ref 0.2–0.9)
Monocytes Relative: 8 %
Neutro Abs: 7.7 10*3/uL — ABNORMAL HIGH (ref 1.4–6.5)
Neutrophils Relative %: 70 %
Platelets: 394 10*3/uL (ref 150–440)
RBC: 4.67 MIL/uL (ref 3.80–5.20)
RDW: 14.3 % (ref 11.5–14.5)
WBC: 11.2 10*3/uL — AB (ref 3.6–11.0)

## 2018-02-24 LAB — BASIC METABOLIC PANEL
Anion gap: 10 (ref 5–15)
BUN: 19 mg/dL (ref 6–20)
CALCIUM: 9.2 mg/dL (ref 8.9–10.3)
CO2: 26 mmol/L (ref 22–32)
CREATININE: 1.34 mg/dL — AB (ref 0.44–1.00)
Chloride: 101 mmol/L (ref 101–111)
GFR calc Af Amer: 51 mL/min — ABNORMAL LOW (ref >60)
GFR, EST NON AFRICAN AMERICAN: 44 mL/min — AB (ref >60)
GLUCOSE: 197 mg/dL — AB (ref 65–99)
POTASSIUM: 3.8 mmol/L (ref 3.5–5.1)
SODIUM: 137 mmol/L (ref 135–145)

## 2018-02-24 LAB — URINALYSIS, ROUTINE W REFLEX MICROSCOPIC
Bilirubin Urine: NEGATIVE
Glucose, UA: NEGATIVE mg/dL
KETONES UR: NEGATIVE mg/dL
Leukocytes, UA: NEGATIVE
NITRITE: POSITIVE — AB
PROTEIN: NEGATIVE mg/dL
Specific Gravity, Urine: 1.015 (ref 1.005–1.030)
pH: 5 (ref 5.0–8.0)

## 2018-02-24 MED ORDER — HYDROCODONE-ACETAMINOPHEN 5-325 MG PO TABS: 1.0000 | ORAL_TABLET | ORAL | 0 refills | Status: DC | PRN

## 2018-02-24 MED ORDER — LIDOCAINE HCL (CARDIAC) PF 100 MG/5ML IV SOSY
1.5000 mg/kg | PREFILLED_SYRINGE | INTRAVENOUS | Status: AC
Start: 2018-02-24 — End: 2018-02-24
  Administered 2018-02-24: 197.2 mg via INTRAVENOUS
  Filled 2018-02-24: qty 10

## 2018-02-24 MED ORDER — CEPHALEXIN 500 MG PO CAPS: 500.0000 mg | ORAL_CAPSULE | Freq: Three times a day (TID) | ORAL | 0 refills | Status: DC

## 2018-02-24 MED ORDER — MORPHINE SULFATE (PF) 4 MG/ML IV SOLN
4.0000 mg | Freq: Once | INTRAVENOUS | Status: AC
  Administered 2018-02-24: 4 mg via INTRAVENOUS
  Filled 2018-02-24: qty 1

## 2018-02-24 MED ORDER — ONDANSETRON HCL 4 MG/2ML IJ SOLN
4.0000 mg | INTRAMUSCULAR | Status: AC
  Administered 2018-02-24: 4 mg via INTRAVENOUS
  Filled 2018-02-24: qty 2

## 2018-02-24 MED ORDER — ONDANSETRON 4 MG PO TBDP: 4.0000 mg | ORAL_TABLET | Freq: Three times a day (TID) | ORAL | 0 refills | Status: DC | PRN

## 2018-02-24 NOTE — ED Notes (Signed)
Patient transported to X-ray 

## 2018-02-24 NOTE — Discharge Instructions (Signed)
Please take your medications as prescribed.  Please follow-up with your doctor in the next several days for recheck/reevaluation.  Return to the emergency department for any worsening pain or development of fever 101 or higher, or vomiting unable to keep down antibiotics.

## 2018-02-24 NOTE — ED Notes (Signed)
Patient transported to CT 

## 2018-02-24 NOTE — ED Notes (Signed)
Pt attempted to void however was unsuccessful at this time

## 2018-02-24 NOTE — ED Triage Notes (Signed)
Pt states significant hx of kidney stones, reports she has "multiple stones and a large stone in kidneys." Pt states she woke up with severe left side flank pain, took phenazopyridine without relief.

## 2018-02-24 NOTE — ED Provider Notes (Signed)
[facility] Emergency Department Provider Note  ____________________________________________   First MD Initiated Contact with Patient 02/24/18 [phone]     (approximate)  I have reviewed the triage vital signs and the nursing notes.   HISTORY  Chief Complaint Flank Pain    HPI Joy Roberts is a 57 y.o. female with a history of multiple episodes of kidney stones in the past including at least one episode requiring lithotripsy.  She presents for evaluation of acute onset severe left-sided flank pain that is sharp and stabbing and consistent with prior episodes of renal colic.  She was in her normal state of health last night when she went to bed but she awoke about 2 hours prior to arrival in the ED with the severe pain in her side.  She cannot find a position of comfort and feels like she needs to pace the room and attempt to get comfortable.  She had a little bit of nausea.  The pain is severe and nothing makes it better or worse.  No dysuria, no hematuria.  She denies fever/chills, chest pain, shortness of breath, abdominal pain, vomiting.  Past Medical History:  Diagnosis Date  . Asthma   . Diabetes ([place])   . Hypertension   . Kidney stones    Recurrent Kidney Stones  . Osteoarthritis     Patient Active Problem List   Diagnosis Date Noted  . Oral lichen planus 23/76/2831  . Mild intermittent asthma without complication 51/76/1607  . Calculus of kidney 09/26/2017  . Obesity, unspecified 09/26/2017  . Osteoarthritis of multiple joints 03/31/2016  . Controlled type 2 diabetes mellitus without complication, without long-term current use of insulin ([place]) 03/31/2016  . Essential hypertension 09/10/2014  . Varicose veins of both lower extremities 03/27/2014  . Umbilical hernia 37/06/6268  . Hyperlipidemia associated with type 2 diabetes mellitus ([place]) 05/10/2012    Past Surgical History:  Procedure Laterality Date  . CESAREAN SECTION  2003  .  LITHOTRIPSY  01/11/2014    Prior to Admission medications   Medication Sig Start Date End Date Taking? Authorizing Provider  albuterol (PROVENTIL) (2.5 MG/3ML) 0.083% nebulizer solution Take 3 mLs (2.5 mg total) by nebulization every 6 (six) hours as needed for wheezing or shortness of breath. 09/28/16   Marylene Land, NP  amLODipine-benazepril (LOTREL) 10-20 MG capsule Take 1 capsule by mouth daily.    [provider]  aspirin EC 81 MG tablet Take by mouth.    [provider]  atorvastatin (LIPITOR) 10 MG tablet Take 10 mg by mouth daily.    [provider]  budesonide (PULMICORT) 0.5 MG/2ML nebulizer solution Take 2 mLs (0.5 mg total) by nebulization every 6 (six) hours as needed. 10/17/15 09/28/17  Betancourt, Aura Fey, NP  glipiZIDE (GLUCOTROL XL) 2.5 MG 24 hr tablet Take 2.5 mg by mouth daily. 05/31/17 05/31/18  [provider]  hydrochlorothiazide (HYDRODIURIL) 25 MG tablet Take 25 mg by mouth daily.    [provider]  metFORMIN (GLUCOPHAGE-XR) 500 MG 24 hr tablet Take 2 tablets by mouth 2 (two) times daily. 05/31/17 05/31/18  [provider]  phenazopyridine (PYRIDIUM) 200 MG tablet Take 1 tablet (200 mg total) by mouth 3 (three) times daily as needed for pain. 09/25/17   Duanne Guess, PA-C  sitaGLIPtin (JANUVIA) 100 MG tablet Take 1 tablet by mouth daily.    [provider]  traMADol (ULTRAM) 50 MG tablet Take 1 tablet (50 mg total) by mouth every 6 (six)  hours as needed (Do not drive or operate machinery while taking as can cause drowsiness.). 01/26/18   Glean Hess, MD    Allergies Codeine and Sulfa antibiotics  Family History  Problem Relation Age of Onset  . Rheum arthritis Mother   . Hypertension Mother   . Arthritis Mother   . Stroke Father   . Kidney failure Father   . Heart disease Father   . Hypertension Father     Social History Social History   Tobacco Use  . Smoking status: Never Smoker  .  Smokeless tobacco: Never Used  Substance Use Topics  . Alcohol use: No    Alcohol/week: 0.0 oz  . Drug use: No    Review of Systems Constitutional: No fever/chills Eyes: No visual changes. ENT: No sore throat. Cardiovascular: Denies chest pain. Respiratory: Denies shortness of breath. Gastrointestinal: Severe left flank pain.  Nausea, no vomiting.  No diarrhea.  No constipation. Genitourinary: Negative for dysuria and hematuria Musculoskeletal: Severe left flank pain.  Negative for neck pain.   Integumentary: Negative for rash. Neurological: Negative for headaches, focal weakness or numbness.   ____________________________________________   PHYSICAL EXAM:  VITAL SIGNS: ED Triage Vitals  Enc Vitals Group     BP 02/24/18 0605 (!) 140/56     Pulse Rate 02/24/18 0605 99     Resp 02/24/18 0605 20     Temp 02/24/18 0605 97.8 F (36.6 C)     Temp Source 02/24/18 0605 Oral     SpO2 02/24/18 0605 97 %     Weight 02/24/18 0610 131.5 kg (290 lb)     Height 02/24/18 0610 1.651 m (5\' 5" )     Head Circumference --      Peak Flow --      Pain Score 02/24/18 0609 10     Pain Loc --      Pain Edu? --      Excl. in [place]? --     Constitutional: Alert and oriented.  Generally well-appearing but does appear uncomfortable and is pacing the room Eyes: Conjunctivae are normal.  Head: Atraumatic. Nose: No congestion/rhinnorhea. Mouth/Throat: Mucous membranes are moist. Neck: No stridor.  No meningeal signs.   Cardiovascular: Normal rate, regular rhythm. Good peripheral circulation. Grossly normal heart sounds. Respiratory: Normal respiratory effort.  No retractions. Lungs CTAB. Gastrointestinal: Soft and nontender. No distention.  Musculoskeletal: Severe left CVA tenderness to percussion.  No lower extremity tenderness nor edema. No gross deformities of extremities. Neurologic:  Normal speech and language. No gross focal neurologic deficits are appreciated.  Skin:  Skin is warm, dry and  intact. No rash noted. Psychiatric: Mood and affect are normal. Speech and behavior are normal.  ____________________________________________   LABS (all labs ordered are listed, but only abnormal results are displayed)  Labs Reviewed  CBC WITH DIFFERENTIAL/PLATELET - Abnormal; Notable for the following components:      Result Value   WBC 11.2 (*)    Neutro Abs 7.7 (*)    All other components within normal limits  BASIC METABOLIC PANEL - Abnormal; Notable for the following components:   Glucose, Bld 197 (*)    Creatinine, Ser 1.34 (*)    GFR calc non Af Amer 44 (*)    GFR calc Af Amer 51 (*)    All other components within normal limits  URINALYSIS, ROUTINE W REFLEX MICROSCOPIC   ____________________________________________  EKG  None - EKG not ordered by ED physician ____________________________________________  RADIOLOGY Ursula Alert, personally  viewed and evaluated these images (plain radiographs) as part of my medical decision making, as well as reviewing the written report by the radiologist.  ED MD interpretation:  abdominal radiograph is pending  Official radiology report(s): No results found.  ____________________________________________   PROCEDURES  Critical Care performed: No   Procedure(s) performed:   Procedures   ____________________________________________   INITIAL IMPRESSION / ASSESSMENT AND PLAN / ED COURSE  As part of my medical decision making, I reviewed the following data within the [place] notes reviewed and incorporated, Labs reviewed , Old chart reviewed, Patient signed out to Dr. Kerman Passey, Radiograph reviewed  and Notes from prior ED visits    Differential diagnosis includes, but is not limited to, renal colic, UTI/pyelonephritis, renal ischemia, musculoskeletal pain/strain, pneumonia.  Current presentation strongly suggest renal colic/ureteral stone.  I reviewed her medical records and saw that in  the past she has presented to the ED for this sort of presentation and did not require any imaging and her pain was well-controlled and she was discharged with outpatient medicine.  I also reviewed her imaging studies and she has not had a CT scan since 2017 which at the time did demonstrate a 5 mm stone.  I will first treat her with morphine 4 mg IV, Zofran 4 mg IV, and lidocaine 1.5 mg/kg by IV and a 250 mL bag of normal saline, infused over 15 minutes.  I anticipate this combination will be very effective in treating her pain.  I will obtain a 1 view abdominal radiograph to look for any obvious signs of stones.  If this is inconclusive or if additional information is needed I will proceed with a CT scan renal stone protocol.  The patient understands and agrees with the current plan.  Clinical Course as of Feb 24 654  Thu Feb 24, 2018  0653 Lab work is reassuring with a minimal leukocytosis and stable creatinine compared to prior values.  Urinalysis is pending, pain medicine is infusing.  Radiograph is pendingI am transferring emergency department care to Dr. Kerman Passey to reassess the patient's pain level and follow-up on the radiograph and determine if any additional treatment or imaging is needed.   [CF]    Clinical Course User Index [CF] Hinda Kehr, MD    ____________________________________________  FINAL CLINICAL IMPRESSION(S) / ED DIAGNOSES  Final diagnoses:  Left flank pain     MEDICATIONS GIVEN DURING THIS VISIT:  Medications  ondansetron (ZOFRAN) injection 4 mg (4 mg Intravenous Given 02/24/18 0624)  lidocaine (cardiac) 100 mg/25mL (XYLOCAINE) injection 2% 197.2 mg (197.2 mg Intravenous Given 02/24/18 0631)  morphine 4 MG/ML injection 4 mg (4 mg Intravenous Given 02/24/18 1655)     ED Discharge Orders    None       Note:  This document was prepared using Dragon voice recognition software and may include unintentional dictation errors.    Hinda Kehr, MD 02/24/18  [phone]

## 2018-02-24 NOTE — ED Notes (Signed)
Possible stone collected in urine specimen. MD made aware

## 2018-02-24 NOTE — ED Provider Notes (Signed)
-----------------------------------------   8:45 AM on 02/24/2018 -----------------------------------------  Patient's urinalysis is nitrite positive with red blood cells.  In the urine sample itself the patient appeared to have passed an approximate 3 or 4 mm stone.  CT scan shows several stones in the kidneys, mild left-sided hydronephrosis, likely recently passed stone which would correlate with the patient's urinalysis sample, and stone that was in the cup.  We will place the patient on Keflex as a precaution, short course of pain and nausea medication as well.  Patient agreeable to plan of care.   Harvest Dark, MD 02/24/18 [phone]

## 2018-05-31 ENCOUNTER — Encounter: Payer: Self-pay | Admitting: Internal Medicine

## 2018-05-31 ENCOUNTER — Ambulatory Visit: Payer: Managed Care, Other (non HMO) | Admitting: Internal Medicine

## 2018-05-31 VITALS — BP 114/70 | HR 96 | Ht 65.0 in | Wt 299.0 lb

## 2018-05-31 DIAGNOSIS — E119 Type 2 diabetes mellitus without complications: Secondary | ICD-10-CM

## 2018-05-31 DIAGNOSIS — E785 Hyperlipidemia, unspecified: Secondary | ICD-10-CM

## 2018-05-31 DIAGNOSIS — M159 Polyosteoarthritis, unspecified: Secondary | ICD-10-CM

## 2018-05-31 DIAGNOSIS — E1169 Type 2 diabetes mellitus with other specified complication: Secondary | ICD-10-CM | POA: Diagnosis not present

## 2018-05-31 DIAGNOSIS — I10 Essential (primary) hypertension: Secondary | ICD-10-CM | POA: Diagnosis not present

## 2018-05-31 DIAGNOSIS — Z23 Encounter for immunization: Secondary | ICD-10-CM | POA: Diagnosis not present

## 2018-05-31 DIAGNOSIS — M150 Primary generalized (osteo)arthritis: Secondary | ICD-10-CM

## 2018-05-31 NOTE — Progress Notes (Signed)
Date:  05/31/2018   Name:  Joy Roberts   DOB:  [birth year]   MRN:  [id number]   Chief Complaint: Hypertension; Diabetes; and Immunizations (Reg flu shot. ) Diabetes  She presents for her follow-up diabetic visit. She has type 2 diabetes mellitus. Her disease course has been stable. Pertinent negatives for hypoglycemia include no headaches or tremors. Pertinent negatives for diabetes include no chest pain, no fatigue, no polydipsia and no polyuria. Symptoms are stable. Current diabetic treatment includes oral agent (triple therapy). She is compliant with treatment most of the time. Her weight is stable. She monitors blood glucose at home 3-4 x per week. Her breakfast blood glucose is taken between 6-7 am. Her breakfast blood glucose range is generally 140-180 mg/dl. An ACE inhibitor/angiotensin II receptor blocker is being taken.  Hypertension  This is a chronic problem. Pertinent negatives include no chest pain, headaches, palpitations or shortness of breath. Past treatments include diuretics and calcium channel blockers. The current treatment provides significant improvement.  Hyperlipidemia  This is a chronic problem. The problem is uncontrolled. Pertinent negatives include no chest pain or shortness of breath. Current antihyperlipidemic treatment includes statins (advised increase lipitor to 20 mg last visit).  Knee Pain   There was no injury mechanism. The pain is present in the right knee and left knee. The pain is moderate. The pain has been worsening since onset. Pertinent negatives include no numbness. She has tried NSAIDs and rest (bone on bone on right knee, close to the same on left. - seeing ORtho soon) for the symptoms. The treatment provided mild relief.   Lab Results  Component Value Date   HGBA1C 7.6 (H) 01/26/2018   Lab Results  Component Value Date   CREATININE 1.34 (H) 02/24/2018   BUN 19 02/24/2018   NA 137 02/24/2018   K 3.8 02/24/2018   CL 101 02/24/2018   CO2  26 02/24/2018   Lab Results  Component Value Date   CHOL 220 (H) 01/26/2018   HDL 48 01/26/2018   LDLCALC 135 (H) 01/26/2018   TRIG 184 (H) 01/26/2018   CHOLHDL 4.6 (H) 01/26/2018      Review of Systems  Constitutional: Negative for appetite change, fatigue, fever and unexpected weight change.  HENT: Negative for tinnitus and trouble swallowing.   Eyes: Negative for visual disturbance.  Respiratory: Negative for cough, chest tightness and shortness of breath.   Cardiovascular: Negative for chest pain, palpitations and leg swelling.  Gastrointestinal: Negative for abdominal pain.  Endocrine: Negative for polydipsia and polyuria.  Genitourinary: Negative for dysuria and hematuria.  Musculoskeletal: Positive for gait problem. Arthralgias: knee pain -  R>L.  Neurological: Negative for tremors, numbness and headaches.  Psychiatric/Behavioral: Negative for dysphoric mood.    Patient Active Problem List   Diagnosis Date Noted  . Oral lichen planus 03/09/9484  . Mild intermittent asthma without complication 46/27/0350  . Calculus of kidney 09/26/2017  . Obesity, unspecified 09/26/2017  . Osteoarthritis of multiple joints 03/31/2016  . Controlled type 2 diabetes mellitus without complication, without long-term current use of insulin ([place]) 03/31/2016  . Essential hypertension 09/10/2014  . Varicose veins of both lower extremities 03/27/2014  . Umbilical hernia 09/38/1829  . Hyperlipidemia associated with type 2 diabetes mellitus ([place]) 05/10/2012    Allergies  Allergen Reactions  . Codeine Nausea And Vomiting  . Sulfa Antibiotics Nausea And Vomiting    Past Surgical History:  Procedure Laterality Date  . CESAREAN SECTION  2003  . LITHOTRIPSY  01/11/2014    Social History   Tobacco Use  . Smoking status: Never Smoker  . Smokeless tobacco: Never Used  Substance Use Topics  . Alcohol use: No    Alcohol/week: 0.0 standard drinks  . Drug use: No     Medication list  has been reviewed and updated.  Current Meds  Medication Sig  . albuterol (PROVENTIL) (2.5 MG/3ML) 0.083% nebulizer solution Take 3 mLs (2.5 mg total) by nebulization every 6 (six) hours as needed for wheezing or shortness of breath.  Marland Kitchen amLODipine-benazepril (LOTREL) 10-20 MG capsule Take 1 capsule by mouth daily.  Marland Kitchen aspirin EC 81 MG tablet Take by mouth.  Marland Kitchen atorvastatin (LIPITOR) 40 MG tablet TAKE 1 TABLET BY MOUTH EVERY DAY EVERY NIGHT  . glipiZIDE (GLUCOTROL XL) 2.5 MG 24 hr tablet Take 2.5 mg by mouth daily.  . hydrochlorothiazide (HYDRODIURIL) 25 MG tablet Take 25 mg by mouth daily.  . metFORMIN (GLUCOPHAGE-XR) 500 MG 24 hr tablet Take 2 tablets by mouth 2 (two) times daily.  . sitaGLIPtin (JANUVIA) 100 MG tablet Take 1 tablet by mouth daily.    PHQ 2/9 Scores 01/26/2018 09/28/2017  PHQ - 2 Score 0 0    Physical Exam  Constitutional: She is oriented to person, place, and time. She appears well-developed. No distress.  HENT:  Head: Normocephalic and atraumatic.  Neck: Normal range of motion. Neck supple.  Cardiovascular: Normal rate, regular rhythm and normal heart sounds.  Pulmonary/Chest: Effort normal and breath sounds normal. No respiratory distress.  Musculoskeletal:       Right knee: She exhibits decreased range of motion.       Left knee: She exhibits decreased range of motion.  Neurological: She is alert and oriented to person, place, and time.  Skin: Skin is warm and dry. No rash noted.  Psychiatric: She has a normal mood and affect. Her behavior is normal. Thought content normal.  Nursing note and vitals reviewed.   BP 114/70 (BP Location: Right Arm, Patient Position: Sitting, Cuff Size: Normal)   Pulse 96   Ht 5\' 5"  (1.651 m)   Wt 299 lb (135.6 kg)   SpO2 97%   BMI 49.76 kg/m   Assessment and Plan: 1. Controlled type 2 diabetes mellitus without complication, without long-term current use of insulin ([place]) Continue medication, work harder on diet and weight  loss - Hemoglobin A1c  2. Essential hypertension controlled  3. Hyperlipidemia associated with type 2 diabetes mellitus ([place]) On statin  4. Need for influenza vaccination - Flu Vaccine QUAD 6+ mos PF IM (Fluarix Quad PF)  5. Primary osteoarthritis involving multiple joints Continue nsaids as needed Follow up with Ortho as planned   No orders of the defined types were placed in this encounter.   Partially dictated using Editor, commissioning. Any errors are unintentional.  Halina Maidens, MD [facility]  05/31/2018

## 2018-06-01 LAB — HEMOGLOBIN A1C
Est. average glucose Bld gHb Est-mCnc: 194 mg/dL
HEMOGLOBIN A1C: 8.4 % — AB (ref 4.8–5.6)

## 2018-06-07 ENCOUNTER — Ambulatory Visit: Payer: Managed Care, Other (non HMO) | Admitting: Internal Medicine

## 2018-06-07 ENCOUNTER — Encounter: Payer: Self-pay | Admitting: Internal Medicine

## 2018-06-07 VITALS — BP 104/68 | HR 100 | Ht 65.0 in | Wt 299.0 lb

## 2018-06-07 DIAGNOSIS — Z6841 Body Mass Index (BMI) 40.0 and over, adult: Secondary | ICD-10-CM | POA: Diagnosis not present

## 2018-06-07 DIAGNOSIS — E1169 Type 2 diabetes mellitus with other specified complication: Secondary | ICD-10-CM | POA: Diagnosis not present

## 2018-06-07 MED ORDER — HYDROCHLOROTHIAZIDE 25 MG PO TABS: 25.0000 mg | ORAL_TABLET | Freq: Every day | ORAL | 1 refills | Status: DC

## 2018-06-07 MED ORDER — DULAGLUTIDE 1.5 MG/0.5ML ~~LOC~~ SOAJ: 1.5000 mg | SUBCUTANEOUS | 3 refills | Status: DC

## 2018-06-07 NOTE — Progress Notes (Signed)
Date:  06/07/2018   Name:  Joy Roberts   DOB:  September 06, 1961   MRN:  [id number]   Chief Complaint: Diabetes  Diabetes  She presents for her follow-up diabetic visit. She has type 2 diabetes mellitus. Her disease course has been worsening. Pertinent negatives for hypoglycemia include no dizziness or headaches. Pertinent negatives for diabetes include no chest pain and no fatigue. Current diabetic treatment includes oral agent (dual therapy). She is compliant with treatment all of the time. There is no change in her home blood glucose trend.   Lab Results  Component Value Date   HGBA1C 8.4 (H) 05/31/2018   Lab Results  Component Value Date   CREATININE 1.34 (H) 02/24/2018   BUN 19 02/24/2018   NA 137 02/24/2018   K 3.8 02/24/2018   CL 101 02/24/2018   CO2 26 02/24/2018     Review of Systems  Constitutional: Negative for fatigue and fever.  Respiratory: Negative for chest tightness and shortness of breath.   Cardiovascular: Negative for chest pain.  Neurological: Negative for dizziness and headaches.    Patient Active Problem List   Diagnosis Date Noted  . Oral lichen planus 26/71/2458  . Mild intermittent asthma without complication 09/98/3382  . Calculus of kidney 09/26/2017  . Obesity, unspecified 09/26/2017  . Osteoarthritis of multiple joints 03/31/2016  . Controlled type 2 diabetes mellitus without complication, without long-term current use of insulin ([place]) 03/31/2016  . Essential hypertension 09/10/2014  . Varicose veins of both lower extremities 03/27/2014  . Umbilical hernia 50/53/9767  . Hyperlipidemia associated with type 2 diabetes mellitus ([place]) 05/10/2012    Allergies  Allergen Reactions  . Codeine Nausea And Vomiting  . Sulfa Antibiotics Nausea And Vomiting    Past Surgical History:  Procedure Laterality Date  . CESAREAN SECTION  2003  . LITHOTRIPSY  01/11/2014    Social History   Tobacco Use  . Smoking status: Never Smoker  . Smokeless  tobacco: Never Used  Substance Use Topics  . Alcohol use: No    Alcohol/week: 0.0 standard drinks  . Drug use: No     Medication list has been reviewed and updated.  Current Meds  Medication Sig  . albuterol (PROVENTIL) (2.5 MG/3ML) 0.083% nebulizer solution Take 3 mLs (2.5 mg total) by nebulization every 6 (six) hours as needed for wheezing or shortness of breath.  Marland Kitchen amLODipine-benazepril (LOTREL) 10-20 MG capsule Take 1 capsule by mouth daily.  Marland Kitchen aspirin EC 81 MG tablet Take by mouth.  Marland Kitchen atorvastatin (LIPITOR) 40 MG tablet TAKE 1 TABLET BY MOUTH EVERY DAY EVERY NIGHT  . hydrochlorothiazide (HYDRODIURIL) 25 MG tablet Take 1 tablet (25 mg total) by mouth daily.  . sitaGLIPtin (JANUVIA) 100 MG tablet Take 1 tablet by mouth daily.  . [DISCONTINUED] hydrochlorothiazide (HYDRODIURIL) 25 MG tablet Take 25 mg by mouth daily.    PHQ 2/9 Scores 01/26/2018 09/28/2017  PHQ - 2 Score 0 0    Physical Exam  Constitutional: She is oriented to person, place, and time. She appears well-developed. No distress.  HENT:  Head: Normocephalic and atraumatic.  Cardiovascular: Normal rate, regular rhythm and normal heart sounds.  Pulmonary/Chest: Effort normal and breath sounds normal. No respiratory distress.  Musculoskeletal: Normal range of motion.  Neurological: She is alert and oriented to person, place, and time.  Skin: Skin is warm and dry. No rash noted.  Psychiatric: She has a normal mood and affect. Her behavior is normal. Thought content normal.  Nursing note  and vitals reviewed.   BP 104/68   Pulse 100   Ht 5\' 5"  (1.651 m)   Wt 299 lb (135.6 kg)   SpO2 98%   BMI 49.76 kg/m   Assessment and Plan: 1. Controlled type 2 diabetes mellitus with other specified complication, without long-term current use of insulin ([facility]) Sample given for 0.75 mg weekly x 2 then increase to 1.5 mg Continue other medications and call if BS < 80 - Dulaglutide (TRULICITY) 1.5 PX/1.0GY SOPN; Inject 1.5 mg  into the skin once a week.  Dispense: 4 pen; Refill: 3  2. BMI 45.0-49.9, adult ([place]) Contributing to poor DM control   Partially dictated using Editor, commissioning. Any errors are unintentional.  Halina Maidens, MD [facility]  06/07/2018

## 2018-06-07 NOTE — Patient Instructions (Signed)
Check blood sugar once a day in the morning  Call me if blood sugar drops below 80 for instructions on other medications  Inject Trulicity once a week

## 2018-06-20 ENCOUNTER — Other Ambulatory Visit: Payer: Self-pay

## 2018-06-20 MED ORDER — AMLODIPINE BESY-BENAZEPRIL HCL 10-20 MG PO CAPS
1.0000 | ORAL_CAPSULE | Freq: Every day | ORAL | 1 refills | Status: DC
Start: 2018-06-20 — End: 2018-06-23

## 2018-06-20 MED ORDER — ATORVASTATIN CALCIUM 40 MG PO TABS
ORAL_TABLET | ORAL | 1 refills | Status: DC
Start: 2018-06-20 — End: 2018-12-14

## 2018-06-20 MED ORDER — SITAGLIPTIN PHOSPHATE 100 MG PO TABS: 100.0000 mg | ORAL_TABLET | Freq: Every day | ORAL | 1 refills | Status: DC

## 2018-06-20 MED ORDER — METFORMIN HCL ER 500 MG PO TB24: 1000.0000 mg | ORAL_TABLET | Freq: Two times a day (BID) | ORAL | 1 refills | Status: DC

## 2018-06-20 MED ORDER — GLIPIZIDE ER 2.5 MG PO TB24: 2.5000 mg | ORAL_TABLET | Freq: Every day | ORAL | 1 refills | Status: DC

## 2018-06-20 NOTE — Progress Notes (Signed)
Patient called to request refills on medications. Sent meds to [facility] and informed patient.

## 2018-06-23 ENCOUNTER — Other Ambulatory Visit: Payer: Self-pay

## 2018-06-23 MED ORDER — AMLODIPINE BESY-BENAZEPRIL HCL 5-20 MG PO CAPS: 1.0000 | ORAL_CAPSULE | Freq: Every day | ORAL | 1 refills | Status: DC

## 2018-07-18 ENCOUNTER — Ambulatory Visit: Payer: Managed Care, Other (non HMO) | Admitting: Internal Medicine

## 2018-07-18 ENCOUNTER — Encounter: Payer: Self-pay | Admitting: Internal Medicine

## 2018-07-18 VITALS — BP 102/60 | HR 88 | Ht 65.0 in | Wt 295.0 lb

## 2018-07-18 DIAGNOSIS — L309 Dermatitis, unspecified: Secondary | ICD-10-CM

## 2018-07-18 MED ORDER — TRIAMCINOLONE ACETONIDE 0.1 % EX CREA: 1.0000 "application " | TOPICAL_CREAM | Freq: Two times a day (BID) | CUTANEOUS | 1 refills | Status: DC

## 2018-07-18 NOTE — Patient Instructions (Signed)
Begin Claritin 10 mg daily

## 2018-07-18 NOTE — Progress Notes (Signed)
Date:  07/18/2018   Name:  Joy Roberts   DOB:  [birth year]   MRN:  [id number]   Chief Complaint: Rash (Rash is on both lower arms and around neck. Started 1.5 weeks ago. Itching. Not painful. Only thing that has changed is taking Trulicity- started 5 weeks ago.)  Rash  This is a new problem. The current episode started in the past 7 days. The problem is unchanged. The affected locations include the right arm, left arm and neck. The rash is characterized by itchiness and redness. Pertinent negatives include no fatigue, fever or shortness of breath.  It has not worsened since onset.  She has only use lotion and taken Benadryl once.  She started Trulicity 5 weeks ago.  She took a dose since the rash started with no change in the rash.  Review of Systems  Constitutional: Negative for chills, fatigue and fever.  Respiratory: Negative for chest tightness and shortness of breath.   Cardiovascular: Negative for chest pain.  Skin: Positive for rash.  Hematological: Negative for adenopathy.  Psychiatric/Behavioral: Negative for sleep disturbance.    Patient Active Problem List   Diagnosis Date Noted  . Oral lichen planus 40/98/1191  . Mild intermittent asthma without complication 47/82/9562  . Calculus of kidney 09/26/2017  . BMI 45.0-49.9, adult ([place]) 09/26/2017  . Osteoarthritis of multiple joints 03/31/2016  . Controlled type 2 diabetes mellitus without complication, without long-term current use of insulin ([place]) 03/31/2016  . Essential hypertension 09/10/2014  . Varicose veins of both lower extremities 03/27/2014  . Umbilical hernia 13/04/6577  . Hyperlipidemia associated with type 2 diabetes mellitus ([place]) 05/10/2012    Allergies  Allergen Reactions  . Codeine Nausea And Vomiting  . Sulfa Antibiotics Nausea And Vomiting    Past Surgical History:  Procedure Laterality Date  . CESAREAN SECTION  2003  . LITHOTRIPSY  01/11/2014    Social History   Tobacco Use  .  Smoking status: Never Smoker  . Smokeless tobacco: Never Used  Substance Use Topics  . Alcohol use: No    Alcohol/week: 0.0 standard drinks  . Drug use: No     Medication list has been reviewed and updated.  Current Meds  Medication Sig  . albuterol (PROVENTIL) (2.5 MG/3ML) 0.083% nebulizer solution Take 3 mLs (2.5 mg total) by nebulization every 6 (six) hours as needed for wheezing or shortness of breath.  Marland Kitchen amLODipine-benazepril (LOTREL) 5-20 MG capsule Take 1 capsule by mouth daily.  Marland Kitchen aspirin EC 81 MG tablet Take by mouth.  Marland Kitchen atorvastatin (LIPITOR) 40 MG tablet TAKE 1 TABLET BY MOUTH EVERY DAY EVERY NIGHT  . Dulaglutide (TRULICITY) 1.5 IO/9.6EX SOPN Inject 1.5 mg into the skin once a week.  Marland Kitchen glipiZIDE (GLUCOTROL XL) 2.5 MG 24 hr tablet Take 1 tablet (2.5 mg total) by mouth daily.  . hydrochlorothiazide (HYDRODIURIL) 25 MG tablet Take 1 tablet (25 mg total) by mouth daily.  . metFORMIN (GLUCOPHAGE-XR) 500 MG 24 hr tablet Take 2 tablets (1,000 mg total) by mouth 2 (two) times daily.  . sitaGLIPtin (JANUVIA) 100 MG tablet Take 1 tablet (100 mg total) by mouth daily.    PHQ 2/9 Scores 01/26/2018 09/28/2017  PHQ - 2 Score 0 0    Physical Exam  Constitutional: She is oriented to person, place, and time. She appears well-developed. No distress.  HENT:  Head: Normocephalic and atraumatic.  Cardiovascular: Normal rate, regular rhythm and normal heart sounds.  Pulmonary/Chest: Effort normal and breath sounds normal. No  respiratory distress.  Musculoskeletal: Normal range of motion.  Neurological: She is alert and oriented to person, place, and time.  Skin: Skin is warm and dry. Rash noted.  Skin pink around neck with area of excoriation on right Forearms pink with mild excoriations on the left and more on the right No raised lesions or blisters  Psychiatric: She has a normal mood and affect. Her behavior is normal. Thought content normal.  Nursing note and vitals reviewed.   BP  102/60 (BP Location: Right Arm, Patient Position: Sitting, Cuff Size: Large)   Pulse 88   Ht 5\' 5"  (1.651 m)   Wt 295 lb (133.8 kg)   SpO2 95%   BMI 49.09 kg/m   Assessment and Plan: 1. Dermatitis Does not appear to be Trulicity Begin Claritin 10 mg daily Call or follow up if worsening - triamcinolone cream (KENALOG) 0.1 %; Apply 1 application topically 2 (two) times daily.  Dispense: 80 g; Refill: 1   Partially dictated using Editor, commissioning. Any errors are unintentional.  Halina Maidens, MD [facility]  07/18/2018

## 2018-09-30 ENCOUNTER — Ambulatory Visit: Payer: Managed Care, Other (non HMO) | Admitting: Internal Medicine

## 2018-10-09 ENCOUNTER — Other Ambulatory Visit: Payer: Self-pay | Admitting: Internal Medicine

## 2018-10-09 DIAGNOSIS — E1169 Type 2 diabetes mellitus with other specified complication: Secondary | ICD-10-CM

## 2018-10-09 MED ORDER — DULAGLUTIDE 1.5 MG/0.5ML ~~LOC~~ SOAJ: 1.5000 mg | SUBCUTANEOUS | 5 refills | Status: DC

## 2018-11-03 ENCOUNTER — Other Ambulatory Visit: Payer: Self-pay

## 2018-11-03 ENCOUNTER — Ambulatory Visit: Payer: Managed Care, Other (non HMO) | Admitting: Internal Medicine

## 2018-11-03 ENCOUNTER — Other Ambulatory Visit
Admission: RE | Admit: 2018-11-03 | Discharge: 2018-11-03 | Disposition: A | Discharge status: Home / Self Care | Payer: Managed Care, Other (non HMO) | Attending: Internal Medicine | Admitting: Internal Medicine

## 2018-11-03 ENCOUNTER — Encounter: Payer: Self-pay | Admitting: Internal Medicine

## 2018-11-03 VITALS — BP 112/68 | HR 99 | Ht 65.0 in | Wt 283.0 lb

## 2018-11-03 DIAGNOSIS — E119 Type 2 diabetes mellitus without complications: Secondary | ICD-10-CM | POA: Diagnosis present

## 2018-11-03 DIAGNOSIS — I10 Essential (primary) hypertension: Secondary | ICD-10-CM | POA: Diagnosis not present

## 2018-11-03 LAB — COMPREHENSIVE METABOLIC PANEL
ALBUMIN: 3.8 g/dL (ref 3.5–5.0)
ALK PHOS: 90 U/L (ref 38–126)
ALT: 30 U/L (ref 0–44)
AST: 26 U/L (ref 15–41)
Anion gap: 10 (ref 5–15)
BUN: 14 mg/dL (ref 6–20)
CALCIUM: 8.8 mg/dL — AB (ref 8.9–10.3)
CO2: 28 mmol/L (ref 22–32)
Chloride: 100 mmol/L (ref 98–111)
Creatinine, Ser: 1.14 mg/dL — ABNORMAL HIGH (ref 0.44–1.00)
GFR calc Af Amer: >60 mL/min (ref >60)
GFR calc non Af Amer: 54 mL/min — ABNORMAL LOW (ref >60)
GLUCOSE: 137 mg/dL — AB (ref 70–99)
Potassium: 3.6 mmol/L (ref 3.5–5.1)
Sodium: 138 mmol/L (ref 135–145)
TOTAL PROTEIN: 7.3 g/dL (ref 6.5–8.1)
Total Bilirubin: 0.6 mg/dL (ref 0.3–1.2)

## 2018-11-03 NOTE — Progress Notes (Signed)
Date:  11/03/2018   Name:  Joy Roberts   DOB:  [birth year]   MRN:  [id number]   Chief Complaint: Diabetes (A1C and Foot Exam. )  Diabetes  She presents for her follow-up diabetic visit. She has type 2 diabetes mellitus. Pertinent negatives for hypoglycemia include no headaches or tremors. Pertinent negatives for diabetes include no chest pain, no fatigue, no polydipsia and no polyuria. Current diabetic treatment includes oral agent (triple therapy) Celesta Gentile, metformin, glucotrol - Trulicity added last visit). She is compliant with treatment all of the time. An ACE inhibitor/angiotensin II receptor blocker is being taken.  Hypertension  This is a chronic problem. The problem is controlled. Pertinent negatives include no chest pain, headaches, palpitations or shortness of breath. Past treatments include ACE inhibitors, calcium channel blockers and diuretics.    Review of Systems  Constitutional: Negative for appetite change, fatigue, fever and unexpected weight change.  HENT: Negative for tinnitus and trouble swallowing.   Eyes: Negative for visual disturbance.  Respiratory: Negative for cough, chest tightness and shortness of breath.   Cardiovascular: Negative for chest pain, palpitations and leg swelling.  Gastrointestinal: Negative for abdominal pain.  Endocrine: Negative for polydipsia and polyuria.  Genitourinary: Negative for dysuria and hematuria.  Musculoskeletal: Negative for arthralgias.  Neurological: Negative for tremors, numbness and headaches.  Psychiatric/Behavioral: Negative for dysphoric mood.    Patient Active Problem List   Diagnosis Date Noted  . Oral lichen planus 54/00/8676  . Mild intermittent asthma without complication 19/50/9326  . Calculus of kidney 09/26/2017  . BMI 45.0-49.9, adult ([place]) 09/26/2017  . Osteoarthritis of multiple joints 03/31/2016  . Controlled type 2 diabetes mellitus without complication, without long-term current use of insulin  ([place]) 03/31/2016  . Essential hypertension 09/10/2014  . Varicose veins of both lower extremities 03/27/2014  . Umbilical hernia 71/24/5809  . Hyperlipidemia associated with type 2 diabetes mellitus ([place]) 05/10/2012    Allergies  Allergen Reactions  . Codeine Nausea And Vomiting  . Sulfa Antibiotics Nausea And Vomiting    Past Surgical History:  Procedure Laterality Date  . CESAREAN SECTION  2003  . LITHOTRIPSY  01/11/2014    Social History   Tobacco Use  . Smoking status: Never Smoker  . Smokeless tobacco: Never Used  Substance Use Topics  . Alcohol use: No    Alcohol/week: 0.0 standard drinks  . Drug use: No     Medication list has been reviewed and updated.  Current Meds  Medication Sig  . albuterol (PROVENTIL) (2.5 MG/3ML) 0.083% nebulizer solution Take 3 mLs (2.5 mg total) by nebulization every 6 (six) hours as needed for wheezing or shortness of breath.  Marland Kitchen amLODipine-benazepril (LOTREL) 5-20 MG capsule Take 1 capsule by mouth daily.  Marland Kitchen aspirin EC 81 MG tablet Take by mouth.  Marland Kitchen atorvastatin (LIPITOR) 40 MG tablet TAKE 1 TABLET BY MOUTH EVERY DAY EVERY NIGHT  . Dulaglutide (TRULICITY) 1.5 XI/3.3AS SOPN Inject 1.5 mg into the skin once a week.  Marland Kitchen glipiZIDE (GLUCOTROL XL) 2.5 MG 24 hr tablet Take 1 tablet (2.5 mg total) by mouth daily.  . hydrochlorothiazide (HYDRODIURIL) 25 MG tablet Take 1 tablet (25 mg total) by mouth daily.  . metFORMIN (GLUCOPHAGE-XR) 500 MG 24 hr tablet Take 2 tablets (1,000 mg total) by mouth 2 (two) times daily.  . sitaGLIPtin (JANUVIA) 100 MG tablet Take 1 tablet (100 mg total) by mouth daily.  Marland Kitchen triamcinolone cream (KENALOG) 0.1 % Apply 1 application topically 2 (two) times daily.  PHQ 2/9 Scores 11/03/2018 01/26/2018 09/28/2017  PHQ - 2 Score 0 0 0    Physical Exam Vitals signs and nursing note reviewed.  Constitutional:      General: She is not in acute distress.    Appearance: She is well-developed.  HENT:     Head: Normocephalic  and atraumatic.  Eyes:     Pupils: Pupils are equal, round, and reactive to light.  Neck:     Musculoskeletal: Normal range of motion and neck supple.  Cardiovascular:     Rate and Rhythm: Normal rate and regular rhythm.     Pulses: Normal pulses.  Pulmonary:     Effort: Pulmonary effort is normal. No respiratory distress.  Musculoskeletal: Normal range of motion.  Skin:    General: Skin is warm and dry.     Findings: No rash.  Neurological:     Mental Status: She is alert and oriented to person, place, and time.  Psychiatric:        Behavior: Behavior normal.        Thought Content: Thought content normal.    Wt Readings from Last 3 Encounters:  11/03/18 283 lb (128.4 kg)  07/18/18 295 lb (133.8 kg)  06/07/18 299 lb (135.6 kg)    BP 112/68   Pulse 99   Ht 5\' 5"  (1.651 m)   Wt 283 lb (128.4 kg)   SpO2 98%   BMI 47.09 kg/m   Assessment and Plan: 1. Controlled type 2 diabetes mellitus without complication, without long-term current use of insulin ([place]) Doing well on current medication Has lost 12 lbs on trulicity Will schedule DM eye exam - Comprehensive metabolic panel - Hemoglobin A1c  2. Essential hypertension controlled  Next visit for Pap, mammogram.  Partially dictated using Editor, commissioning. Any errors are unintentional.  Halina Maidens, MD [facility]  11/03/2018

## 2018-11-03 NOTE — Patient Instructions (Signed)
Schedule eye exam and ask them to send me office notes after each visit.

## 2018-11-04 LAB — HEMOGLOBIN A1C
Hgb A1c MFr Bld: 7.1 % — ABNORMAL HIGH (ref 4.8–5.6)
MEAN PLASMA GLUCOSE: 157 mg/dL

## 2018-11-27 ENCOUNTER — Other Ambulatory Visit: Payer: Self-pay | Admitting: Internal Medicine

## 2018-12-12 ENCOUNTER — Telehealth: Payer: Self-pay

## 2018-12-12 ENCOUNTER — Other Ambulatory Visit: Payer: Self-pay | Admitting: Internal Medicine

## 2018-12-12 DIAGNOSIS — J4520 Mild intermittent asthma, uncomplicated: Secondary | ICD-10-CM

## 2018-12-12 MED ORDER — ALBUTEROL SULFATE (2.5 MG/3ML) 0.083% IN NEBU: 2.5000 mg | INHALATION_SOLUTION | Freq: Four times a day (QID) | RESPIRATORY_TRACT | 0 refills | Status: DC | PRN

## 2018-12-12 NOTE — Telephone Encounter (Signed)
Asthma flare up from pollen and dust after cleaning garage. Wants refill for Albuterol 0. 83 for Nebulizer machine. Cough and chest tightness. No Wheezing, no fever, and no travel or exposer to Covid 19 as far as she knows. Last flare up 12 mo or more ago. Using [facility]. Will come in if need to but feels this is the beginning of this and she is fine doing treatments as needed.

## 2018-12-12 NOTE — Telephone Encounter (Signed)
Advised-jh 

## 2018-12-12 NOTE — Telephone Encounter (Signed)
[facility] [place]

## 2018-12-14 ENCOUNTER — Other Ambulatory Visit: Payer: Self-pay | Admitting: Internal Medicine

## 2018-12-15 ENCOUNTER — Other Ambulatory Visit: Payer: Self-pay | Admitting: Internal Medicine

## 2019-01-30 ENCOUNTER — Encounter: Payer: Managed Care, Other (non HMO) | Admitting: Internal Medicine

## 2019-02-25 ENCOUNTER — Other Ambulatory Visit: Payer: Self-pay | Admitting: Internal Medicine

## 2019-02-25 DIAGNOSIS — E1169 Type 2 diabetes mellitus with other specified complication: Secondary | ICD-10-CM

## 2019-03-20 ENCOUNTER — Ambulatory Visit
Admission: EM | Admit: 2019-03-20 | Discharge: 2019-03-20 | Disposition: A | Discharge status: Home / Self Care | Payer: Managed Care, Other (non HMO) | Attending: Family Medicine | Admitting: Family Medicine

## 2019-03-20 ENCOUNTER — Other Ambulatory Visit: Payer: Self-pay

## 2019-03-20 DIAGNOSIS — R3129 Other microscopic hematuria: Secondary | ICD-10-CM

## 2019-03-20 DIAGNOSIS — R102 Pelvic and perineal pain: Secondary | ICD-10-CM | POA: Diagnosis not present

## 2019-03-20 DIAGNOSIS — R350 Frequency of micturition: Secondary | ICD-10-CM

## 2019-03-20 DIAGNOSIS — Z87442 Personal history of urinary calculi: Secondary | ICD-10-CM | POA: Diagnosis not present

## 2019-03-20 LAB — URINALYSIS, COMPLETE (UACMP) WITH MICROSCOPIC
Bacteria, UA: NONE SEEN
Bilirubin Urine: NEGATIVE
Glucose, UA: NEGATIVE mg/dL
Ketones, ur: NEGATIVE mg/dL
Leukocytes,Ua: NEGATIVE
Nitrite: NEGATIVE
Protein, ur: NEGATIVE mg/dL
Specific Gravity, Urine: <1.005 — ABNORMAL LOW (ref 1.005–1.030)
WBC, UA: NONE SEEN WBC/hpf (ref 0–5)
pH: 6 (ref 5.0–8.0)

## 2019-03-20 NOTE — ED Triage Notes (Signed)
Joy Roberts reports increased urinary frequency, pressure and discomfort, denies burning with urination.

## 2019-03-20 NOTE — ED Provider Notes (Signed)
[place]   Name: Joy Roberts DOB: June 24, 1961 MRN: [id number] CSN: [id number] PCP: Glean Hess, MD  Arrival date and time:  03/20/19 1556  Chief Complaint:  Urinary Frequency (pressure)   NOTE: Prior to seeing the patient today, I have reviewed the triage nursing documentation and vital signs. Clinical staff has updated patient's PMH/PSHx, current medication list, and drug allergies/intolerances to ensure comprehensive history available to assist in medical decision making.   History:   HPI: Joy Roberts is a 58 y.o. female who presents today with complaints of urinary frequency that began on Saturday (03/18/2019). Patient denies dysuria and any gross hematuria. With the onset of her symptoms she experienced some chills, however never developed an associated fever. Chills were short lived and resolved about mid-morning on day 1 of patient's symptoms. Patient denies back, flank, and abdominal pain. She notes some intermittent vaginal burning, which she notes that she gets "from time to time". PMH (+) for urolithiasis. She notes that she is well aware of the pain associated with stones, and she has "not hurt that bad" with her current symptoms. Patient normally able to pass urolithiasis without difficulties; last was approximately 2 weeks ago.   Past Medical History:  Diagnosis Date  . Asthma   . Diabetes ([place])   . Hypertension   . Kidney stones    Recurrent Kidney Stones  . Osteoarthritis     Past Surgical History:  Procedure Laterality Date  . CESAREAN SECTION  2003  . LITHOTRIPSY  01/11/2014    Family History  Problem Relation Age of Onset  . Rheum arthritis Mother   . Hypertension Mother   . Arthritis Mother   . Stroke Father   . Kidney failure Father   . Heart disease Father   . Hypertension Father     Social History   Tobacco Use  . Smoking status: Never Smoker  . Smokeless tobacco: Never Used  Substance Use Topics  . Alcohol use: No   Alcohol/week: 0.0 standard drinks  . Drug use: No    Patient Active Problem List   Diagnosis Date Noted  . Oral lichen planus 28/00/3491  . Mild intermittent asthma without complication 79/15/0569  . Calculus of kidney 09/26/2017  . BMI 45.0-49.9, adult ([place]) 09/26/2017  . Osteoarthritis of multiple joints 03/31/2016  . Controlled type 2 diabetes mellitus without complication, without long-term current use of insulin ([place]) 03/31/2016  . Essential hypertension 09/10/2014  . Varicose veins of both lower extremities 03/27/2014  . Umbilical hernia 79/48/0165  . Hyperlipidemia associated with type 2 diabetes mellitus ([place]) 05/10/2012    Home Medications:    Current Meds  Medication Sig  . albuterol (PROVENTIL) (2.5 MG/3ML) 0.083% nebulizer solution Take 3 mLs (2.5 mg total) by nebulization every 6 (six) hours as needed for wheezing or shortness of breath.  Marland Kitchen amLODipine-benazepril (LOTREL) 5-20 MG capsule TAKE 1 CAPSULE BY MOUTH EVERY DAY  . aspirin EC 81 MG tablet Take by mouth.  Marland Kitchen atorvastatin (LIPITOR) 40 MG tablet TAKE 1 TABLET BY MOUTH EVERY NIGHT  . glipiZIDE (GLUCOTROL XL) 2.5 MG 24 hr tablet TAKE 1 TABLET BY MOUTH EVERY DAY  . hydrochlorothiazide (HYDRODIURIL) 25 MG tablet TAKE 1 TABLET BY MOUTH EVERY DAY  . JANUVIA 100 MG tablet TAKE 1 TABLET BY MOUTH EVERY DAY  . metFORMIN (GLUCOPHAGE-XR) 500 MG 24 hr tablet TAKE 2 TABLETS BY MOUTH TWICE A DAY  . TRULICITY 1.5 VV/7.4MO SOPN INJECT 1.5 MG INTO THE SKIN ONCE A  WEEK.  . [DISCONTINUED] triamcinolone cream (KENALOG) 0.1 % Apply 1 application topically 2 (two) times daily.    Allergies:   Codeine and Sulfa antibiotics  Review of Systems (ROS): Review of Systems  Constitutional: Positive for chills. Negative for fatigue and fever.  Respiratory: Negative for cough and shortness of breath.   Cardiovascular: Negative for chest pain and palpitations.  Gastrointestinal: Negative for abdominal distention, abdominal pain ("bladder  pressure"), diarrhea, nausea and rectal pain.  Genitourinary: Positive for frequency and vaginal pain (intermittent "burning"). Negative for dysuria, flank pain, hematuria, urgency and vaginal bleeding.       PMH (+) for frequent urolithiasis  Musculoskeletal: Negative for back pain and myalgias.  Neurological: Negative for dizziness, weakness and headaches.     Vital Signs: Today's Vitals   03/20/19 1618 03/20/19 1621 03/20/19 1622 03/20/19 1659  BP: 115/81     Pulse: 91     Resp: 16     Temp: 98.1 F (36.7 C)     TempSrc: Oral     SpO2: 100%     Weight:   285 lb (129.3 kg)   Height:   5\' 5"  (1.651 m)   PainSc:  6   0-No pain    Physical Exam: Physical Exam  Constitutional: She is oriented to person, place, and time and well-developed, well-nourished, and in no distress.  HENT:  Head: Normocephalic and atraumatic.  Mouth/Throat: Oropharynx is clear and moist and mucous membranes are normal.  Eyes: Pupils are equal, round, and reactive to light. EOM are normal.  Neck: Normal range of motion. Neck supple. No tracheal deviation present.  Cardiovascular: Normal rate, regular rhythm, normal heart sounds and intact distal pulses. Exam reveals no gallop and no friction rub.  No murmur heard. Pulmonary/Chest: Effort normal and breath sounds normal. No respiratory distress. She has no wheezes. She has no rales.  Abdominal: Soft. Normal appearance and bowel sounds are normal. She exhibits no distension. There is abdominal tenderness ("pressure") in the suprapubic area. There is no CVA tenderness.  Neurological: She is alert and oriented to person, place, and time. Gait normal. GCS score is 15.  Skin: Skin is warm and dry. No rash noted.  Psychiatric: Mood, memory, affect and judgment normal.  Nursing note and vitals reviewed.   Urgent Care Treatments / Results:   LABS: PLEASE NOTE: all labs that were ordered this encounter are listed, however only abnormal results are displayed.  Labs Reviewed  URINALYSIS, COMPLETE (UACMP) WITH MICROSCOPIC - Abnormal; Notable for the following components:      Result Value   Specific Gravity, Urine <1.005 (*)    Hgb urine dipstick TRACE (*)    All other components within normal limits    EKG: -None  RADIOLOGY: No results found.  PROCEDURES: Procedures  MEDICATIONS RECEIVED THIS VISIT: Medications - No data to display  PERTINENT CLINICAL COURSE NOTES/UPDATES:   Initial Impression / Assessment and Plan / Urgent Care Course:  Pertinent labs & imaging results that were available during my care of the patient were personally reviewed by me and considered in my medical decision making (see lab/imaging section of note for values and interpretations).  Joy Roberts is a 58 y.o. female who presents to [facility] today with complaints of Urinary Frequency (pressure)   Patient overall well appearing and in no acute distress today in clinic. Exam is grossly benign. UA negative for infection. No dysuria or urgency. There was trace blood in her urine, which in light of  her other reported symptoms today, could represent a recently passed urinary stone. Urinary frequency is likely multifactorial in nature and related to patient increasing her fluid intake due to recurrent urolithiasis, thiazide diuretic use, and her diabetes. Reassurance provided. Patient advised to continue hydration (water) to prevent stone formation. Discussed that there may be some utility in over the counter Azo if she continues to experience the pressure over her bladder. May use Tylenol and/or Ibuprofen as needed for associated discomfort as well.   Discussed follow up with primary care physician in 1 week for re-evaluation. I have reviewed the follow up and strict return precautions for any new or worsening symptoms. Patient is aware of symptoms that would be deemed urgent/emergent, and would thus require further evaluation either here or in the emergency  department. At the time of discharge, she verbalized understanding and consent with the discharge plan as it was reviewed with her. All questions were fielded by provider and/or clinic staff prior to patient discharge.    Final Clinical Impressions / Urgent Care Diagnoses:   Final diagnoses:  Urinary frequency  Microscopic hematuria  Suprapubic pressure  History of kidney stones    New Prescriptions:  [place] Controlled Substance Registry consulted? Not Applicable  No orders of the defined types were placed in this encounter.   Recommended Follow up Care:  Patient encouraged to follow up with the following provider within the specified time frame, or sooner as dictated by the severity of her symptoms. As always, she was instructed that for any urgent/emergent care needs, she should seek care either here or in the emergency department for more immediate evaluation. Follow-up Information    Glean Hess, MD In 1 week.   Specialty: Internal Medicine Why: General reassessment of symptoms if not improving Contact information: [address] [place] [address] [phone]          NOTE: This note was prepared using Dragon dictation software along with smaller phrase technology. Despite my best ability to proofread, there is the potential that transcriptional errors may still occur from this process, and are completely unintentional.     Karen Kitchens, NP 03/22/19 0004

## 2019-03-20 NOTE — Discharge Instructions (Addendum)
It was very nice seeing you today in clinic. Thank you for entrusting me with your care.   Your urine does not look infected at this time. Increase fluid intake. May try over the counter Azo products for your symptoms.   Make arrangements to follow up with your regular doctor in 1 week for re-evaluation if not improving. If your symptoms/condition worsens, please seek follow up care either here or in the ER. Please remember, our [place] providers are "right here with you" when you need [place].   Again, it was my pleasure to take care of you today. Thank you for choosing our clinic. I hope that you start to feel better quickly.   Honor Loh, MSN, APRN, FNP-C, CEN Advanced Practice Provider [facility]

## 2019-05-24 ENCOUNTER — Other Ambulatory Visit: Payer: Self-pay

## 2019-05-24 MED ORDER — HYDROCHLOROTHIAZIDE 25 MG PO TABS: 25.0000 mg | ORAL_TABLET | Freq: Every day | ORAL | 0 refills | Status: DC

## 2019-05-24 MED ORDER — ATORVASTATIN CALCIUM 40 MG PO TABS: ORAL_TABLET | ORAL | 0 refills | Status: DC

## 2019-05-24 MED ORDER — METFORMIN HCL ER 500 MG PO TB24: 1000.0000 mg | ORAL_TABLET | Freq: Two times a day (BID) | ORAL | 0 refills | Status: DC

## 2019-05-24 MED ORDER — AMLODIPINE BESY-BENAZEPRIL HCL 5-20 MG PO CAPS
ORAL_CAPSULE | ORAL | 0 refills | Status: DC
Start: 2019-05-24 — End: 2019-09-03

## 2019-05-24 MED ORDER — SITAGLIPTIN PHOSPHATE 100 MG PO TABS: 100.0000 mg | ORAL_TABLET | Freq: Every day | ORAL | 0 refills | Status: DC

## 2019-05-24 MED ORDER — GLIPIZIDE ER 2.5 MG PO TB24: 2.5000 mg | ORAL_TABLET | Freq: Every day | ORAL | 0 refills | Status: DC

## 2019-06-07 ENCOUNTER — Encounter: Payer: Managed Care, Other (non HMO) | Admitting: Internal Medicine

## 2019-06-12 ENCOUNTER — Other Ambulatory Visit: Payer: Self-pay

## 2019-06-12 ENCOUNTER — Ambulatory Visit: Payer: Managed Care, Other (non HMO) | Admitting: Internal Medicine

## 2019-06-12 ENCOUNTER — Encounter: Payer: Self-pay | Admitting: Internal Medicine

## 2019-06-12 VITALS — BP 104/70 | HR 106 | Ht 65.0 in | Wt 281.0 lb

## 2019-06-12 DIAGNOSIS — Z23 Encounter for immunization: Secondary | ICD-10-CM

## 2019-06-12 DIAGNOSIS — M150 Primary generalized (osteo)arthritis: Secondary | ICD-10-CM | POA: Diagnosis not present

## 2019-06-12 DIAGNOSIS — E1169 Type 2 diabetes mellitus with other specified complication: Secondary | ICD-10-CM | POA: Diagnosis not present

## 2019-06-12 DIAGNOSIS — M159 Polyosteoarthritis, unspecified: Secondary | ICD-10-CM

## 2019-06-12 DIAGNOSIS — I10 Essential (primary) hypertension: Secondary | ICD-10-CM

## 2019-06-12 NOTE — Patient Instructions (Signed)

## 2019-06-12 NOTE — Progress Notes (Signed)
Date:  06/12/2019   Name:  Joy Roberts   DOB:  1961-06-04   MRN:  [id number]   Chief Complaint: Diabetes (Patient wants to change her CPE today to a Diabetic. Flu shot. ) Pt declines to address HM concerns at this time - no mammogram or colonoscopy.  Diabetes She presents for her follow-up diabetic visit. She has type 2 diabetes mellitus. Her disease course has been stable. Pertinent negatives for hypoglycemia include no headaches or tremors. Pertinent negatives for diabetes include no chest pain, no fatigue, no polydipsia and no polyuria. Her weight is stable. She is following a diabetic diet. She participates in exercise intermittently. She monitors blood glucose at home 3-4 x per week. Her dinner blood glucose is taken between 6-7 pm. Her dinner blood glucose range is generally 110-130 mg/dl. An ACE inhibitor/angiotensin II receptor blocker is being taken. Eye exam is not current.  Hypertension This is a chronic problem. The problem is unchanged. The problem is controlled. Pertinent negatives include no chest pain, headaches, palpitations or shortness of breath. Past treatments include ACE inhibitors and calcium channel blockers.   Lab Results  Component Value Date   HGBA1C 7.1 (H) 11/03/2018   Lab Results  Component Value Date   CREATININE 1.14 (H) 11/03/2018   BUN 14 11/03/2018   NA 138 11/03/2018   K 3.6 11/03/2018   CL 100 11/03/2018   CO2 28 11/03/2018    Review of Systems  Constitutional: Negative for appetite change, fatigue, fever and unexpected weight change.  HENT: Negative for tinnitus and trouble swallowing.   Eyes: Negative for visual disturbance.  Respiratory: Negative for cough, chest tightness and shortness of breath.   Cardiovascular: Negative for chest pain, palpitations and leg swelling.  Gastrointestinal: Negative for abdominal pain.  Endocrine: Negative for polydipsia and polyuria.  Genitourinary: Negative for dysuria and hematuria.   Musculoskeletal: Negative for arthralgias.  Neurological: Negative for tremors, numbness and headaches.  Psychiatric/Behavioral: Negative for dysphoric mood.    Patient Active Problem List   Diagnosis Date Noted  . Oral lichen planus 123456  . Mild intermittent asthma without complication 123456  . Calculus of kidney 09/26/2017  . BMI 45.0-49.9, adult ([place]) 09/26/2017  . Osteoarthritis of multiple joints 03/31/2016  . Controlled type 2 diabetes mellitus without complication, without long-term current use of insulin ([place]) 03/31/2016  . Essential hypertension 09/10/2014  . Varicose veins of both lower extremities 03/27/2014  . Umbilical hernia 0000000  . Hyperlipidemia associated with type 2 diabetes mellitus ([place]) 05/10/2012    Allergies  Allergen Reactions  . Codeine Nausea And Vomiting  . Sulfa Antibiotics Nausea And Vomiting    Past Surgical History:  Procedure Laterality Date  . CESAREAN SECTION  2003  . LITHOTRIPSY  01/11/2014    Social History   Tobacco Use  . Smoking status: Never Smoker  . Smokeless tobacco: Never Used  Substance Use Topics  . Alcohol use: No    Alcohol/week: 0.0 standard drinks  . Drug use: No     Medication list has been reviewed and updated.  Current Meds  Medication Sig  . albuterol (PROVENTIL) (2.5 MG/3ML) 0.083% nebulizer solution Take 3 mLs (2.5 mg total) by nebulization every 6 (six) hours as needed for wheezing or shortness of breath.  Marland Kitchen amLODipine-benazepril (LOTREL) 5-20 MG capsule TAKE 1 CAPSULE BY MOUTH EVERY DAY  . aspirin EC 81 MG tablet Take by mouth.  Marland Kitchen atorvastatin (LIPITOR) 40 MG tablet TAKE 1 TABLET BY MOUTH EVERY NIGHT  .  glipiZIDE (GLUCOTROL XL) 2.5 MG 24 hr tablet Take 1 tablet (2.5 mg total) by mouth daily.  . hydrochlorothiazide (HYDRODIURIL) 25 MG tablet Take 1 tablet (25 mg total) by mouth daily.  . metFORMIN (GLUCOPHAGE-XR) 500 MG 24 hr tablet Take 2 tablets (1,000 mg total) by mouth 2 (two) times  daily.  . sitaGLIPtin (JANUVIA) 100 MG tablet Take 1 tablet (100 mg total) by mouth daily.  . TRULICITY 1.5 0000000 SOPN INJECT 1.5 MG INTO THE SKIN ONCE A WEEK.    PHQ 2/9 Scores 06/12/2019 11/03/2018 01/26/2018 09/28/2017  PHQ - 2 Score 0 0 0 0    BP Readings from Last 3 Encounters:  06/12/19 104/70  03/20/19 115/81  11/03/18 112/68    Physical Exam Vitals signs and nursing note reviewed.  Constitutional:      General: She is not in acute distress.    Appearance: She is well-developed.  HENT:     Head: Normocephalic and atraumatic.  Cardiovascular:     Rate and Rhythm: Normal rate and regular rhythm.     Pulses: Normal pulses.  Pulmonary:     Effort: Pulmonary effort is normal. No respiratory distress.     Breath sounds: Normal breath sounds.  Musculoskeletal:     Right knee: She exhibits decreased range of motion.     Left knee: She exhibits decreased range of motion.     Right lower leg: No edema.     Left lower leg: No edema.  Skin:    General: Skin is warm and dry.     Findings: No rash.  Neurological:     Mental Status: She is alert and oriented to person, place, and time.  Psychiatric:        Behavior: Behavior normal.        Thought Content: Thought content normal.     Wt Readings from Last 3 Encounters:  06/12/19 281 lb (127.5 kg)  03/20/19 285 lb (129.3 kg)  11/03/18 283 lb (128.4 kg)    BP 104/70   Pulse (!) 106   Ht 5\' 5"  (1.651 m)   Wt 281 lb (127.5 kg)   SpO2 96%   BMI 46.76 kg/m   Assessment and Plan: 1. Controlled type 2 diabetes mellitus with other specified complication, without long-term current use of insulin ([place]) Clinically stable by exam and report without s/s of hypoglycemia. DM complicated by HTN, lipids, obesity. Tolerating medications - glipizide, metformin, januvia and trulicity well without side effects or other concerns. - Basic metabolic panel - Hemoglobin A1c  2. Primary osteoarthritis involving multiple joints Continue  Tylenol PRN Anticipating knee replacement within the next year  3. Essential hypertension Clinically stable exam with well controlled BP.   Tolerating medications, lotrel and hctz, without side effects at this time. Pt to continue current regimen and low sodium diet; benefits of regular exercise as able discussed. - TSH  4. Need for immunization against influenza - Flu Vaccine QUAD 36+ mos IM   Partially dictated using Editor, commissioning. Any errors are unintentional.  Halina Maidens, MD [facility]  06/12/2019

## 2019-06-13 LAB — BASIC METABOLIC PANEL
BUN/Creatinine Ratio: 14 (ref 9–23)
BUN: 16 mg/dL (ref 6–24)
CO2: 27 mmol/L (ref 20–29)
Calcium: 9.5 mg/dL (ref 8.7–10.2)
Chloride: 99 mmol/L (ref 96–106)
Creatinine, Ser: 1.15 mg/dL — ABNORMAL HIGH (ref 0.57–1.00)
GFR calc Af Amer: 61 mL/min/{1.73_m2} (ref >59)
GFR calc non Af Amer: 53 mL/min/{1.73_m2} — ABNORMAL LOW (ref >59)
Glucose: 143 mg/dL — ABNORMAL HIGH (ref 65–99)
Potassium: 4.3 mmol/L (ref 3.5–5.2)
Sodium: 140 mmol/L (ref 134–144)

## 2019-06-13 LAB — HEMOGLOBIN A1C
Est. average glucose Bld gHb Est-mCnc: 151 mg/dL
Hgb A1c MFr Bld: 6.9 % — ABNORMAL HIGH (ref 4.8–5.6)

## 2019-06-13 LAB — TSH: TSH: 4.04 u[IU]/mL (ref 0.450–4.500)

## 2019-08-17 ENCOUNTER — Other Ambulatory Visit: Payer: Self-pay | Admitting: Internal Medicine

## 2019-08-17 NOTE — Telephone Encounter (Signed)
In formed pt, her physical is march 17

## 2019-09-03 ENCOUNTER — Other Ambulatory Visit: Payer: Self-pay | Admitting: Internal Medicine

## 2019-11-11 ENCOUNTER — Other Ambulatory Visit: Payer: Self-pay | Admitting: Internal Medicine

## 2019-11-15 ENCOUNTER — Ambulatory Visit
Admission: EM | Admit: 2019-11-15 | Discharge: 2019-11-15 | Disposition: A | Discharge status: Home / Self Care | Payer: Managed Care, Other (non HMO) | Attending: Family Medicine | Admitting: Family Medicine

## 2019-11-15 ENCOUNTER — Other Ambulatory Visit: Payer: Self-pay

## 2019-11-15 DIAGNOSIS — R2232 Localized swelling, mass and lump, left upper limb: Secondary | ICD-10-CM

## 2019-11-15 NOTE — ED Triage Notes (Signed)
Pt presents with spot on left hand just distal to second digit.  Slightly swollen.  Noticed two days ago, not worsening but sore to touch.

## 2019-11-15 NOTE — ED Provider Notes (Signed)
[facility]    CSN: [id number] Arrival date & time: 11/15/19  1617      History   Chief Complaint Chief Complaint  Patient presents with  . Hand Pain    HPI  59 year old female presents with a palpable nodule on the palmar aspect of her left hand at the level of the second MCP joint.  Patient states that she just noticed it 2 days ago.  The area is tender to palpation.  It is not interfering with her activities.  She is concerned about what it may be.  She states that she does not recall any fall, trauma, injury.  No relieving factors.  No other associated symptoms.  No other complaints.   Past Medical History:  Diagnosis Date  . Asthma   . Diabetes ([place])   . Hypertension   . Kidney stones    Recurrent Kidney Stones  . Osteoarthritis     Patient Active Problem List   Diagnosis Date Noted  . Oral lichen planus 123456  . Mild intermittent asthma without complication 123456  . Calculus of kidney 09/26/2017  . BMI 45.0-49.9, adult ([place]) 09/26/2017  . Osteoarthritis of multiple joints 03/31/2016  . Type II diabetes mellitus with complication ([place]) 99991111  . Essential hypertension 09/10/2014  . Varicose veins of both lower extremities 03/27/2014  . Umbilical hernia 0000000  . Hyperlipidemia associated with type 2 diabetes mellitus ([place]) 05/10/2012    Past Surgical History:  Procedure Laterality Date  . CESAREAN SECTION  2003  . LITHOTRIPSY  01/11/2014    OB History   No obstetric history on file.      Home Medications    Prior to Admission medications   Medication Sig Start Date End Date Taking? Authorizing Provider  albuterol (PROVENTIL) (2.5 MG/3ML) 0.083% nebulizer solution Take 3 mLs (2.5 mg total) by nebulization every 6 (six) hours as needed for wheezing or shortness of breath. 12/12/18  Yes Glean Hess, MD  amLODipine-benazepril (LOTREL) 5-20 MG capsule TAKE 1 CAPSULE BY MOUTH EVERY DAY 09/03/19  Yes Glean Hess, MD    aspirin EC 81 MG tablet Take by mouth.   Yes [provider]  atorvastatin (LIPITOR) 40 MG tablet TAKE 1 TABLET BY MOUTH EVERY DAY AT NIGHT 09/03/19  Yes Glean Hess, MD  glipiZIDE (GLUCOTROL XL) 2.5 MG 24 hr tablet TAKE 1 TABLET BY MOUTH EVERY DAY 09/03/19  Yes Glean Hess, MD  hydrochlorothiazide (HYDRODIURIL) 25 MG tablet TAKE 1 TABLET BY MOUTH EVERY DAY 11/11/19  Yes Glean Hess, MD  JANUVIA 100 MG tablet TAKE 1 TABLET BY MOUTH EVERY DAY 09/03/19  Yes Glean Hess, MD  metFORMIN (GLUCOPHAGE-XR) 500 MG 24 hr tablet TAKE 2 TABLETS BY MOUTH TWICE A DAY 09/03/19  Yes Glean Hess, MD  TRULICITY 1.5 0000000 SOPN INJECT 1.5 MG INTO THE SKIN ONCE A WEEK. 02/25/19  Yes Glean Hess, MD    Family History Family History  Problem Relation Age of Onset  . Rheum arthritis Mother   . Hypertension Mother   . Arthritis Mother   . Stroke Father   . Kidney failure Father   . Heart disease Father   . Hypertension Father     Social History Social History   Tobacco Use  . Smoking status: Never Smoker  . Smokeless tobacco: Never Used  Substance Use Topics  . Alcohol use: No    Alcohol/week: 0.0 standard drinks  . Drug use: No  Allergies   Codeine and Sulfa antibiotics   Review of Systems Review of Systems Per HPI  Physical Exam Triage Vital Signs ED Triage Vitals  Enc Vitals Group     BP 11/15/19 1714 114/76     Pulse Rate 11/15/19 1714 95     Resp 11/15/19 1714 20     Temp 11/15/19 1714 98.5 F (36.9 C)     Temp Source 11/15/19 1714 Oral     SpO2 11/15/19 1714 100 %     Weight --      Height --      Head Circumference --      Peak Flow --      Pain Score 11/15/19 1712 0     Pain Loc --      Pain Edu? --      Excl. in [place]? --    Updated Vital Signs BP 114/76 (BP Location: Right Arm)   Pulse 95   Temp 98.5 F (36.9 C) (Oral)   Resp 20   SpO2 100%   Visual Acuity Right Eye Distance:   Left Eye Distance:   Bilateral  Distance:    Right Eye Near:   Left Eye Near:    Bilateral Near:     Physical Exam Vitals and nursing note reviewed.  Constitutional:      General: She is not in acute distress.    Appearance: Normal appearance. She is obese. She is not ill-appearing.  HENT:     Head: Normocephalic and atraumatic.  Eyes:     General:        Right eye: No discharge.        Left eye: No discharge.     Conjunctiva/sclera: Conjunctivae normal.  Pulmonary:     Effort: Pulmonary effort is normal. No respiratory distress.  Musculoskeletal:       Hands:     Comments: Patient with a palpable nodule at the labeled location.  Tender to palpation.  Neurological:     Mental Status: She is alert.  Psychiatric:        Mood and Affect: Mood normal.        Behavior: Behavior normal.    UC Treatments / Results  Labs (all labs ordered are listed, but only abnormal results are displayed) Labs Reviewed - No data to display  EKG   Radiology No results found.  Procedures Procedures (including critical care time)  Medications Ordered in UC Medications - No data to display  Initial Impression / Assessment and Plan / UC Course  I have reviewed the triage vital signs and the nursing notes.  Pertinent labs & imaging results that were available during my care of the patient were reviewed by me and considered in my medical decision making (see chart for details).    59 year old female presents with a mass/painful nodule of the left hand.  Etiology uncertain.  25-gauge needle was used for attempted aspiration (unsuccessful).  Advised to see hand surgery.  Supportive care.  Final Clinical Impressions(s) / UC Diagnoses   Final diagnoses:  Mass of left hand     Discharge Instructions     Please call [facility] [phone]) for an appt - Hand Surgeon Dr. Peggye Ley.  Take care  Dr. Lacinda Axon     ED Prescriptions    None     PDMP not reviewed this encounter.   Coral Spikes, [place] 11/15/19 1830

## 2019-11-15 NOTE — Discharge Instructions (Signed)
Please call [facility] [phone]) for an appt - Hand Surgeon Dr. Peggye Ley.  Take care  Dr. Lacinda Axon

## 2019-11-28 ENCOUNTER — Other Ambulatory Visit: Payer: Self-pay | Admitting: Internal Medicine

## 2019-11-29 ENCOUNTER — Encounter: Payer: Managed Care, Other (non HMO) | Admitting: Internal Medicine

## 2019-12-06 ENCOUNTER — Other Ambulatory Visit: Payer: Self-pay | Admitting: Internal Medicine

## 2019-12-09 ENCOUNTER — Ambulatory Visit: Payer: Managed Care, Other (non HMO) | Attending: Internal Medicine

## 2019-12-09 DIAGNOSIS — Z23 Encounter for immunization: Secondary | ICD-10-CM

## 2019-12-09 NOTE — Progress Notes (Signed)
   Covid-19 Vaccination Clinic  Name:  Joy Roberts    MRN: [id number] DOB: 02-28-1961  12/09/2019  Joy Roberts was observed post Covid-19 immunization for 15 minutes without incident. She was provided with Vaccine Information Sheet and instruction to access the V-Safe system.   Joy Roberts was instructed to call 911 with any severe reactions post vaccine: Marland Kitchen Difficulty breathing  . Swelling of face and throat  . A fast heartbeat  . A bad rash all over body  . Dizziness and weakness   Immunizations Administered    Name Date Dose VIS Date Route   Pfizer COVID-19 Vaccine 12/09/2019  4:02 PM 0.3 mL 08/25/2019 Intramuscular   Manufacturer: [place]   Lot: [id number]   [place]: [id number]

## 2019-12-11 ENCOUNTER — Other Ambulatory Visit: Payer: Self-pay | Admitting: Internal Medicine

## 2020-01-02 ENCOUNTER — Ambulatory Visit: Payer: Managed Care, Other (non HMO) | Attending: Internal Medicine

## 2020-01-02 DIAGNOSIS — Z23 Encounter for immunization: Secondary | ICD-10-CM

## 2020-01-02 NOTE — Progress Notes (Signed)
   Covid-19 Vaccination Clinic  Name:  Joy Roberts    MRN: [id number] DOB: 1961-06-29  01/02/2020  Joy Roberts was observed post Covid-19 immunization for 15 minutes without incident. She was provided with Vaccine Information Sheet and instruction to access the V-Safe system.   Joy Roberts was instructed to call 911 with any severe reactions post vaccine: Marland Kitchen Difficulty breathing  . Swelling of face and throat  . A fast heartbeat  . A bad rash all over body  . Dizziness and weakness   Immunizations Administered    Name Date Dose VIS Date Route   Pfizer COVID-19 Vaccine 01/02/2020  2:33 PM 0.3 mL 11/08/2018 Intramuscular   Manufacturer: [place]   Lot: [id number]   [place]: [id number]

## 2020-01-18 ENCOUNTER — Encounter: Payer: Self-pay | Admitting: Internal Medicine

## 2020-01-18 ENCOUNTER — Other Ambulatory Visit: Payer: Self-pay

## 2020-01-18 ENCOUNTER — Ambulatory Visit (INDEPENDENT_AMBULATORY_CARE_PROVIDER_SITE_OTHER): Payer: Managed Care, Other (non HMO) | Admitting: Internal Medicine

## 2020-01-18 VITALS — BP 112/78 | HR 86 | Temp 97.3°F | Ht 65.0 in | Wt 280.0 lb

## 2020-01-18 DIAGNOSIS — E1169 Type 2 diabetes mellitus with other specified complication: Secondary | ICD-10-CM | POA: Diagnosis not present

## 2020-01-18 DIAGNOSIS — I10 Essential (primary) hypertension: Secondary | ICD-10-CM

## 2020-01-18 DIAGNOSIS — Z6841 Body Mass Index (BMI) 40.0 and over, adult: Secondary | ICD-10-CM

## 2020-01-18 DIAGNOSIS — E118 Type 2 diabetes mellitus with unspecified complications: Secondary | ICD-10-CM

## 2020-01-18 DIAGNOSIS — E785 Hyperlipidemia, unspecified: Secondary | ICD-10-CM

## 2020-01-18 DIAGNOSIS — Z0000 Encounter for general adult medical examination without abnormal findings: Secondary | ICD-10-CM | POA: Diagnosis not present

## 2020-01-18 DIAGNOSIS — J4520 Mild intermittent asthma, uncomplicated: Secondary | ICD-10-CM

## 2020-01-18 DIAGNOSIS — M159 Polyosteoarthritis, unspecified: Secondary | ICD-10-CM

## 2020-01-18 DIAGNOSIS — M8949 Other hypertrophic osteoarthropathy, multiple sites: Secondary | ICD-10-CM

## 2020-01-18 MED ORDER — HYDROCHLOROTHIAZIDE 25 MG PO TABS: 25.0000 mg | ORAL_TABLET | Freq: Every day | ORAL | 3 refills | Status: DC

## 2020-01-18 MED ORDER — ATORVASTATIN CALCIUM 40 MG PO TABS: 40.0000 mg | ORAL_TABLET | Freq: Every day | ORAL | 3 refills | Status: DC

## 2020-01-18 MED ORDER — AMLODIPINE BESY-BENAZEPRIL HCL 5-20 MG PO CAPS: 1.0000 | ORAL_CAPSULE | Freq: Every day | ORAL | 3 refills | Status: DC

## 2020-01-18 MED ORDER — GLIPIZIDE ER 2.5 MG PO TB24: 2.5000 mg | ORAL_TABLET | Freq: Every day | ORAL | 3 refills | Status: DC

## 2020-01-18 NOTE — Progress Notes (Signed)
Date:  01/18/2020   Name:  Joy Roberts   DOB:  1961-09-13   MRN:  [id number]   Chief Complaint: Annual Exam (Breast Exam. Declined pap smear. Declined Mammo. Declined Colonoscopy. Foot exam.) Joy Roberts is a 59 y.o. female who presents today for her Complete Annual Exam. She feels well. She reports exercising none due to knee pain. She reports she is sleeping well. She denies breast issues.  Mammogram - none Pap  01/2011 Colonoscopy - none Immunization History  Administered Date(s) Administered  . Influenza,inj,Quad PF,6+ Mos 09/28/2017, 05/31/2018, 06/12/2019  . PFIZER SARS-COV-2 Vaccination 12/09/2019, 01/02/2020  . Pneumococcal Polysaccharide-23 03/01/2013  . Tdap 05/10/2012    Diabetes She presents for her follow-up diabetic visit. She has type 2 diabetes mellitus. Her disease course has been stable. Pertinent negatives for hypoglycemia include no dizziness, headaches, nervousness/anxiousness or tremors. Pertinent negatives for diabetes include no chest pain, no fatigue, no polydipsia and no polyuria. Current diabetic treatments: Trulicity, januvia, metformin, glipizide. An ACE inhibitor/angiotensin II receptor blocker is being taken.  Hypertension This is a chronic problem. The problem is controlled. Pertinent negatives include no chest pain, headaches, palpitations or shortness of breath. Past treatments include ACE inhibitors and calcium channel blockers. The current treatment provides significant improvement.  Hyperlipidemia The problem is controlled. Exacerbating diseases include diabetes. Pertinent negatives include no chest pain or shortness of breath. Current antihyperlipidemic treatment includes statins. The current treatment provides significant improvement of lipids.  Knee Pain  There was no injury mechanism. The pain is present in the left knee and right knee. The quality of the pain is described as aching. The pain is moderate. The pain has been worsening  since onset.  Asthma There is no cough, shortness of breath or wheezing. This is a recurrent problem. The problem occurs rarely. Pertinent negatives include no chest pain, fever, headaches or trouble swallowing. Her symptoms are aggravated by exposure to smoke, pollen, change in weather and URI. Her symptoms are alleviated by beta-agonist. She reports complete improvement on treatment. Her past medical history is significant for asthma.    Lab Results  Component Value Date   CREATININE 1.15 (H) 06/12/2019   BUN 16 06/12/2019   NA 140 06/12/2019   K 4.3 06/12/2019   CL 99 06/12/2019   CO2 27 06/12/2019   Lab Results  Component Value Date   CHOL 220 (H) 01/26/2018   HDL 48 01/26/2018   LDLCALC 135 (H) 01/26/2018   TRIG 184 (H) 01/26/2018   CHOLHDL 4.6 (H) 01/26/2018   Lab Results  Component Value Date   TSH 4.040 06/12/2019   Lab Results  Component Value Date   HGBA1C 6.9 (H) 06/12/2019   Lab Results  Component Value Date   WBC 11.2 (H) 02/24/2018   HGB 13.3 02/24/2018   HCT 39.9 02/24/2018   MCV 85.5 02/24/2018   PLT 394 02/24/2018   Lab Results  Component Value Date   ALT 30 11/03/2018   AST 26 11/03/2018   ALKPHOS 90 11/03/2018   BILITOT 0.6 11/03/2018     Review of Systems  Constitutional: Negative for chills, fatigue and fever.  HENT: Negative for congestion, hearing loss, tinnitus, trouble swallowing and voice change.   Eyes: Negative for visual disturbance.  Respiratory: Negative for cough, chest tightness, shortness of breath and wheezing.   Cardiovascular: Negative for chest pain, palpitations and leg swelling.  Gastrointestinal: Negative for abdominal pain, constipation, diarrhea and vomiting.  Endocrine: Negative for polydipsia and polyuria.  Genitourinary: Negative for dysuria, frequency, genital sores, vaginal bleeding and vaginal discharge.  Musculoskeletal: Negative for arthralgias, gait problem and joint swelling.  Skin: Negative for color change  and rash.  Neurological: Negative for dizziness, tremors, light-headedness and headaches.  Hematological: Negative for adenopathy. Does not bruise/bleed easily.  Psychiatric/Behavioral: Negative for dysphoric mood and sleep disturbance. The patient is not nervous/anxious.     Patient Active Problem List   Diagnosis Date Noted  . Oral lichen planus 123456  . Mild intermittent asthma without complication 123456  . Calculus of kidney 09/26/2017  . BMI 45.0-49.9, adult ([place]) 09/26/2017  . Osteoarthritis of multiple joints 03/31/2016  . Type II diabetes mellitus with complication ([place]) 99991111  . Essential hypertension 09/10/2014  . Varicose veins of both lower extremities 03/27/2014  . Umbilical hernia 0000000  . Hyperlipidemia associated with type 2 diabetes mellitus ([place]) 05/10/2012    Allergies  Allergen Reactions  . Codeine Nausea And Vomiting  . Sulfa Antibiotics Nausea And Vomiting    Past Surgical History:  Procedure Laterality Date  . CESAREAN SECTION  2003  . LITHOTRIPSY  01/11/2014    Social History   Tobacco Use  . Smoking status: Never Smoker  . Smokeless tobacco: Never Used  Substance Use Topics  . Alcohol use: No    Alcohol/week: 0.0 standard drinks  . Drug use: No     Medication list has been reviewed and updated.  Current Meds  Medication Sig  . albuterol (PROVENTIL) (2.5 MG/3ML) 0.083% nebulizer solution Take 3 mLs (2.5 mg total) by nebulization every 6 (six) hours as needed for wheezing or shortness of breath.  Marland Kitchen amLODipine-benazepril (LOTREL) 5-20 MG capsule TAKE 1 CAPSULE BY MOUTH EVERY DAY  . aspirin EC 81 MG tablet Take by mouth.  Marland Kitchen atorvastatin (LIPITOR) 40 MG tablet TAKE 1 TABLET BY MOUTH EVERY DAY AT NIGHT  . glipiZIDE (GLUCOTROL XL) 2.5 MG 24 hr tablet TAKE 1 TABLET BY MOUTH EVERY DAY  . hydrochlorothiazide (HYDRODIURIL) 25 MG tablet TAKE 1 TABLET BY MOUTH EVERY DAY  . JANUVIA 100 MG tablet TAKE 1 TABLET BY MOUTH EVERY DAY  .  metFORMIN (GLUCOPHAGE-XR) 500 MG 24 hr tablet TAKE 2 TABLETS BY MOUTH TWICE A DAY  . TRULICITY 1.5 0000000 SOPN INJECT 1.5 MG INTO THE SKIN ONCE A WEEK.    PHQ 2/9 Scores 01/18/2020 06/12/2019 11/03/2018 01/26/2018  PHQ - 2 Score 0 0 0 0  PHQ- 9 Score 0 - - -    BP Readings from Last 3 Encounters:  01/18/20 112/78  11/15/19 114/76  06/12/19 104/70    Physical Exam Vitals and nursing note reviewed.  Constitutional:      General: She is not in acute distress.    Appearance: She is well-developed.  HENT:     Head: Normocephalic and atraumatic.     Right Ear: Tympanic membrane and ear canal normal.     Left Ear: Tympanic membrane and ear canal normal.     Nose:     Right Sinus: No maxillary sinus tenderness.     Left Sinus: No maxillary sinus tenderness.  Eyes:     General: No scleral icterus.       Right eye: No discharge.        Left eye: No discharge.     Conjunctiva/sclera: Conjunctivae normal.  Neck:     Thyroid: No thyromegaly.     Vascular: No carotid bruit.  Cardiovascular:     Rate and Rhythm: Normal rate and regular rhythm.  Pulses: Normal pulses.     Heart sounds: Normal heart sounds.  Pulmonary:     Effort: Pulmonary effort is normal. No respiratory distress.     Breath sounds: No wheezing.  Chest:     Breasts:        Right: No mass, nipple discharge, skin change or tenderness.        Left: No mass, nipple discharge, skin change or tenderness.  Abdominal:     General: Bowel sounds are normal.     Palpations: Abdomen is soft.     Tenderness: There is no abdominal tenderness.  Musculoskeletal:     Cervical back: Normal range of motion. No erythema.     Right knee: No effusion. Decreased range of motion.     Left knee: No effusion. Decreased range of motion.  Lymphadenopathy:     Cervical: No cervical adenopathy.  Skin:    General: Skin is warm and dry.     Findings: No rash.  Neurological:     Mental Status: She is alert and oriented to person, place,  and time.     Cranial Nerves: No cranial nerve deficit.     Sensory: Sensation is intact. No sensory deficit.     Motor: Motor function is intact.     Gait: Gait abnormal (uses cane).     Deep Tendon Reflexes: Reflexes are normal and symmetric.  Psychiatric:        Attention and Perception: Attention normal.        Mood and Affect: Mood normal.     Wt Readings from Last 3 Encounters:  01/18/20 280 lb (127 kg)  06/12/19 281 lb (127.5 kg)  03/20/19 285 lb (129.3 kg)    BP 112/78   Pulse 86   Temp (!) 97.3 F (36.3 C) (Temporal)   Ht 5\' 5"  (1.651 m)   Wt 280 lb (127 kg)   SpO2 98%   BMI 46.59 kg/m   Assessment and Plan: 1. Annual physical exam Pt declines mammogram, colonoscopy, Pap Recommend exercise as able, healthy diet  2. Essential hypertension Clinically stable exam with well controlled BP on amlodipine, hctz and benazepril. Tolerating medications without side effects at this time. Pt to continue current regimen and low sodium diet; benefits of regular exercise as able discussed. - CBC with Differential/Platelet - TSH - hydrochlorothiazide (HYDRODIURIL) 25 MG tablet; Take 1 tablet (25 mg total) by mouth daily.  Dispense: 90 tablet; Refill: 3 - amLODipine-benazepril (LOTREL) 5-20 MG capsule; Take 1 capsule by mouth daily.  Dispense: 90 capsule; Refill: 3  3. Type II diabetes mellitus with complication (HCC) Clinically stable by exam and report without s/s of hypoglycemia. DM complicated by htn and obesity. Tolerating medications well without side effects or other concerns. Will increase Trulicity to 3 mg at next refill (pt will call) and consider stopping Januvia if A1C is improved.  This may help with weight loss. - Comprehensive metabolic panel - Hemoglobin A1c - glipiZIDE (GLUCOTROL XL) 2.5 MG 24 hr tablet; Take 1 tablet (2.5 mg total) by mouth daily.  Dispense: 90 tablet; Refill: 3  4. Hyperlipidemia associated with type 2 diabetes mellitus ([place]) Tolerating  high intensity statin medication without side effects at this time LDL is not at goal of < 70 on current dose Continue same therapy without change at this time.  Consider change to Crestor for additional benefit  - Lipid panel - atorvastatin (LIPITOR) 40 MG tablet; Take 1 tablet (40 mg total) by mouth daily.  Dispense:  90 tablet; Refill: 3  5. Mild intermittent asthma without complication Stable with only rare use of albuterol inhaler  6. Primary osteoarthritis involving multiple joints Advanced OA of both knees requiring use of a cane for support Taking advil rarely; needs TKA but also needs to lose weight beforehand  7. BMI 45.0-49.9, adult ([place]) Higher dose Trulicity may be helpful Pt is unable to exercise due to OA   Partially dictated using Editor, commissioning. Any errors are unintentional.  Halina Maidens, MD [facility]  01/18/2020

## 2020-01-19 LAB — COMPREHENSIVE METABOLIC PANEL
ALT: 20 IU/L (ref 0–32)
AST: 20 IU/L (ref 0–40)
Albumin/Globulin Ratio: 1.3 (ref 1.2–2.2)
Albumin: 3.9 g/dL (ref 3.8–4.9)
Alkaline Phosphatase: 88 IU/L (ref 39–117)
BUN/Creatinine Ratio: 13 (ref 9–23)
BUN: 14 mg/dL (ref 6–24)
Bilirubin Total: 0.3 mg/dL (ref 0.0–1.2)
CO2: 23 mmol/L (ref 20–29)
Calcium: 9.2 mg/dL (ref 8.7–10.2)
Chloride: 103 mmol/L (ref 96–106)
Creatinine, Ser: 1.05 mg/dL — ABNORMAL HIGH (ref 0.57–1.00)
GFR calc Af Amer: 68 mL/min/{1.73_m2} (ref >59)
GFR calc non Af Amer: 59 mL/min/{1.73_m2} — ABNORMAL LOW (ref >59)
Globulin, Total: 2.9 g/dL (ref 1.5–4.5)
Glucose: 118 mg/dL — ABNORMAL HIGH (ref 65–99)
Potassium: 4.7 mmol/L (ref 3.5–5.2)
Sodium: 142 mmol/L (ref 134–144)
Total Protein: 6.8 g/dL (ref 6.0–8.5)

## 2020-01-19 LAB — CBC WITH DIFFERENTIAL/PLATELET
Basophils Absolute: 0.1 10*3/uL (ref 0.0–0.2)
Basos: 1 %
EOS (ABSOLUTE): 0.3 10*3/uL (ref 0.0–0.4)
Eos: 4 %
Hematocrit: 39.9 % (ref 34.0–46.6)
Hemoglobin: 13.2 g/dL (ref 11.1–15.9)
Immature Grans (Abs): 0 10*3/uL (ref 0.0–0.1)
Immature Granulocytes: 0 %
Lymphocytes Absolute: 2.4 10*3/uL (ref 0.7–3.1)
Lymphs: 30 %
MCH: 28.1 pg (ref 26.6–33.0)
MCHC: 33.1 g/dL (ref 31.5–35.7)
MCV: 85 fL (ref 79–97)
Monocytes Absolute: 0.7 10*3/uL (ref 0.1–0.9)
Monocytes: 9 %
Neutrophils Absolute: 4.5 10*3/uL (ref 1.4–7.0)
Neutrophils: 56 %
Platelets: 373 10*3/uL (ref 150–450)
RBC: 4.69 x10E6/uL (ref 3.77–5.28)
RDW: 13.5 % (ref 11.7–15.4)
WBC: 8 10*3/uL (ref 3.4–10.8)

## 2020-01-19 LAB — TSH: TSH: 2.69 u[IU]/mL (ref 0.450–4.500)

## 2020-01-19 LAB — HEMOGLOBIN A1C
Est. average glucose Bld gHb Est-mCnc: 143 mg/dL
Hgb A1c MFr Bld: 6.6 % — ABNORMAL HIGH (ref 4.8–5.6)

## 2020-01-19 LAB — LIPID PANEL
Chol/HDL Ratio: 3.2 ratio (ref 0.0–4.4)
Cholesterol, Total: 126 mg/dL (ref 100–199)
HDL: 40 mg/dL (ref >39)
LDL Chol Calc (NIH): 69 mg/dL (ref 0–99)
Triglycerides: 90 mg/dL (ref 0–149)
VLDL Cholesterol Cal: 17 mg/dL (ref 5–40)

## 2020-01-25 ENCOUNTER — Other Ambulatory Visit: Payer: Self-pay | Admitting: Internal Medicine

## 2020-01-25 DIAGNOSIS — E1169 Type 2 diabetes mellitus with other specified complication: Secondary | ICD-10-CM

## 2020-02-26 ENCOUNTER — Encounter: Payer: Self-pay | Admitting: Internal Medicine

## 2020-02-26 ENCOUNTER — Other Ambulatory Visit: Payer: Self-pay | Admitting: Internal Medicine

## 2020-02-26 DIAGNOSIS — E1169 Type 2 diabetes mellitus with other specified complication: Secondary | ICD-10-CM

## 2020-02-26 MED ORDER — TRULICITY 3 MG/0.5ML ~~LOC~~ SOAJ: 3.0000 mg | SUBCUTANEOUS | 3 refills | Status: DC

## 2020-05-14 DIAGNOSIS — Z961 Presence of intraocular lens: Secondary | ICD-10-CM | POA: Insufficient documentation

## 2020-05-21 DIAGNOSIS — Z961 Presence of intraocular lens: Secondary | ICD-10-CM | POA: Insufficient documentation

## 2020-05-22 ENCOUNTER — Ambulatory Visit: Payer: Managed Care, Other (non HMO) | Admitting: Internal Medicine

## 2020-07-10 ENCOUNTER — Encounter: Payer: Self-pay | Admitting: Internal Medicine

## 2020-07-10 ENCOUNTER — Ambulatory Visit: Payer: Managed Care, Other (non HMO) | Admitting: Internal Medicine

## 2020-07-10 ENCOUNTER — Other Ambulatory Visit: Payer: Self-pay

## 2020-07-10 VITALS — BP 112/64 | HR 121 | Temp 98.4°F | Ht 65.0 in | Wt 285.0 lb

## 2020-07-10 DIAGNOSIS — Z23 Encounter for immunization: Secondary | ICD-10-CM | POA: Diagnosis not present

## 2020-07-10 DIAGNOSIS — E118 Type 2 diabetes mellitus with unspecified complications: Secondary | ICD-10-CM

## 2020-07-10 DIAGNOSIS — D1722 Benign lipomatous neoplasm of skin and subcutaneous tissue of left arm: Secondary | ICD-10-CM

## 2020-07-10 DIAGNOSIS — I10 Essential (primary) hypertension: Secondary | ICD-10-CM

## 2020-07-10 NOTE — Progress Notes (Signed)
Date:  07/10/2020   Name:  Joy Roberts   DOB:  Dec 30, 1960   MRN:  [id number]   Chief Complaint: Diabetes (f/u), Hypertension, and Flu Vaccine  Diabetes She presents for her follow-up diabetic visit. She has type 2 diabetes mellitus. Her disease course has been stable. Pertinent negatives for hypoglycemia include no headaches or tremors. Pertinent negatives for diabetes include no chest pain, no fatigue, no polydipsia and no polyuria. Current diabetic treatment includes oral agent (triple therapy). She is compliant with treatment all of the time. An ACE inhibitor/angiotensin II receptor blocker is being taken.  Hypertension This is a chronic problem. The problem is controlled. Pertinent negatives include no chest pain, headaches, palpitations or shortness of breath. Past treatments include ACE inhibitors, calcium channel blockers and diuretics. The current treatment provides significant improvement.    Lab Results  Component Value Date   CREATININE 1.05 (H) 01/18/2020   BUN 14 01/18/2020   NA 142 01/18/2020   K 4.7 01/18/2020   CL 103 01/18/2020   CO2 23 01/18/2020   Lab Results  Component Value Date   CHOL 126 01/18/2020   HDL 40 01/18/2020   LDLCALC 69 01/18/2020   TRIG 90 01/18/2020   CHOLHDL 3.2 01/18/2020   Lab Results  Component Value Date   TSH 2.690 01/18/2020   Lab Results  Component Value Date   HGBA1C 6.6 (H) 01/18/2020   Lab Results  Component Value Date   WBC 8.0 01/18/2020   HGB 13.2 01/18/2020   HCT 39.9 01/18/2020   MCV 85 01/18/2020   PLT 373 01/18/2020   Lab Results  Component Value Date   ALT 20 01/18/2020   AST 20 01/18/2020   ALKPHOS 88 01/18/2020   BILITOT 0.3 01/18/2020     Review of Systems  Constitutional: Negative for appetite change, fatigue, fever and unexpected weight change.  HENT: Negative for tinnitus and trouble swallowing.   Eyes: Negative for visual disturbance.  Respiratory: Negative for cough, chest tightness and  shortness of breath.   Cardiovascular: Negative for chest pain, palpitations and leg swelling.  Gastrointestinal: Negative for abdominal pain.  Endocrine: Negative for polydipsia and polyuria.  Genitourinary: Negative for dysuria and hematuria.  Musculoskeletal: Positive for arthralgias and gait problem.  Neurological: Negative for tremors, numbness and headaches.  Psychiatric/Behavioral: Negative for dysphoric mood.    Patient Active Problem List   Diagnosis Date Noted  . Pseudophakia of left eye 05/21/2020  . Pseudophakia of right eye 05/14/2020  . Oral lichen planus 24/58/0998  . Mild intermittent asthma without complication 33/82/5053  . Calculus of kidney 09/26/2017  . BMI 45.0-49.9, adult ([place]) 09/26/2017  . Osteoarthritis of multiple joints 03/31/2016  . Type II diabetes mellitus with complication ([place]) 97/67/3419  . Essential hypertension 09/10/2014  . Varicose veins of both lower extremities 03/27/2014  . Umbilical hernia 37/90/2409  . Hyperlipidemia associated with type 2 diabetes mellitus ([place]) 05/10/2012    Allergies  Allergen Reactions  . Codeine Nausea And Vomiting  . Sulfa Antibiotics Nausea And Vomiting    Past Surgical History:  Procedure Laterality Date  . CESAREAN SECTION  2003  . LITHOTRIPSY  01/11/2014    Social History   Tobacco Use  . Smoking status: Never Smoker  . Smokeless tobacco: Never Used  Vaping Use  . Vaping Use: Never used  Substance Use Topics  . Alcohol use: No    Alcohol/week: 0.0 standard drinks  . Drug use: No     Medication list  has been reviewed and updated.  Current Meds  Medication Sig  . albuterol (PROVENTIL) (2.5 MG/3ML) 0.083% nebulizer solution Take 3 mLs (2.5 mg total) by nebulization every 6 (six) hours as needed for wheezing or shortness of breath.  Marland Kitchen albuterol (VENTOLIN HFA) 108 (90 Base) MCG/ACT inhaler Ventolin HFA 90 mcg/actuation aerosol inhaler  . amLODipine-benazepril (LOTREL) 5-20 MG capsule Take 1  capsule by mouth daily.  Marland Kitchen aspirin EC 81 MG tablet Take by mouth.  Marland Kitchen atorvastatin (LIPITOR) 40 MG tablet Take 1 tablet (40 mg total) by mouth daily.  . Carboxymethylcellulose Sodium (EYE DROPS OP) Apply to eye in the morning and at bedtime.  . Dulaglutide (TRULICITY) 3 YI/9.4WN SOPN Inject 0.5 mLs (3 mg total) as directed once a week.  Marland Kitchen glipiZIDE (GLUCOTROL XL) 2.5 MG 24 hr tablet Take 1 tablet (2.5 mg total) by mouth daily.  . hydrochlorothiazide (HYDRODIURIL) 25 MG tablet Take 1 tablet (25 mg total) by mouth daily.  . metFORMIN (GLUCOPHAGE-XR) 500 MG 24 hr tablet TAKE 2 TABLETS BY MOUTH TWICE A DAY    PHQ 2/9 Scores 07/10/2020 01/18/2020 06/12/2019 11/03/2018  PHQ - 2 Score 0 0 0 0  PHQ- 9 Score 1 0 - -    GAD 7 : Generalized Anxiety Score 07/10/2020 01/18/2020  Nervous, Anxious, on Edge 0 1  Control/stop worrying 0 0  Worry too much - different things 0 0  Trouble relaxing 0 0  Restless 0 0  Easily annoyed or irritable 0 0  Afraid - awful might happen 0 0  Total GAD 7 Score 0 1  Anxiety Difficulty Not difficult at all Not difficult at all    BP Readings from Last 3 Encounters:  07/10/20 112/64  01/18/20 112/78  11/15/19 114/76    Physical Exam Vitals and nursing note reviewed.  Constitutional:      General: She is not in acute distress.    Appearance: She is well-developed.  HENT:     Head: Normocephalic and atraumatic.  Cardiovascular:     Rate and Rhythm: Normal rate and regular rhythm.     Pulses: Normal pulses.     Heart sounds: No murmur heard.   Pulmonary:     Effort: Pulmonary effort is normal. No respiratory distress.     Breath sounds: No wheezing or rhonchi.  Musculoskeletal:        General: Normal range of motion.     Cervical back: Normal range of motion.  Lymphadenopathy:     Cervical: No cervical adenopathy.  Skin:    General: Skin is warm and dry.     Capillary Refill: Capillary refill takes less than 2 seconds.     Findings: No rash.         Neurological:     General: No focal deficit present.     Mental Status: She is alert and oriented to person, place, and time.     Wt Readings from Last 3 Encounters:  07/10/20 285 lb (129.3 kg)  01/18/20 280 lb (127 kg)  06/12/19 281 lb (127.5 kg)    BP 112/64 (BP Location: Right Arm, Patient Position: Sitting)   Pulse (!) 121   Temp 98.4 F (36.9 C) (Oral)   Ht 5\' 5"  (1.651 m)   Wt 285 lb (129.3 kg)   SpO2 96%   BMI 47.43 kg/m   Assessment and Plan: 1. Type II diabetes mellitus with complication (HCC) Clinically stable by exam and report without s/s of hypoglycemia. DM complicated by HTN. Tolerating  medications (metformin, glipizide and Trulicity) well without side effects or other concerns. - Hemoglobin T4Y - Basic metabolic panel  2. Essential hypertension Clinically stable exam with well controlled BP on lotrel and hctz. Tolerating medications without side effects at this time. Pt to continue current regimen and low sodium diet; benefits of regular exercise as able discussed.  3. Need for immunization against influenza - Flu Vaccine QUAD 36+ mos IM  4. Lipoma of left upper extremity Pt is reassured  For ongoing knee and now hip discomfort, I recommend continued follow up with Ortho.  Partially dictated using Editor, commissioning. Any errors are unintentional.  Halina Maidens, MD [facility]  07/10/2020

## 2020-07-11 LAB — BASIC METABOLIC PANEL
BUN/Creatinine Ratio: 11 (ref 9–23)
BUN: 12 mg/dL (ref 6–24)
CO2: 25 mmol/L (ref 20–29)
Calcium: 9.3 mg/dL (ref 8.7–10.2)
Chloride: 100 mmol/L (ref 96–106)
Creatinine, Ser: 1.14 mg/dL — ABNORMAL HIGH (ref 0.57–1.00)
GFR calc Af Amer: 61 mL/min/{1.73_m2} (ref >59)
GFR calc non Af Amer: 53 mL/min/{1.73_m2} — ABNORMAL LOW (ref >59)
Glucose: 135 mg/dL — ABNORMAL HIGH (ref 65–99)
Potassium: 4.4 mmol/L (ref 3.5–5.2)
Sodium: 141 mmol/L (ref 134–144)

## 2020-07-11 LAB — HEMOGLOBIN A1C
Est. average glucose Bld gHb Est-mCnc: 157 mg/dL
Hgb A1c MFr Bld: 7.1 % — ABNORMAL HIGH (ref 4.8–5.6)

## 2020-11-13 ENCOUNTER — Other Ambulatory Visit: Payer: Self-pay

## 2020-11-13 ENCOUNTER — Ambulatory Visit: Payer: Managed Care, Other (non HMO) | Admitting: Internal Medicine

## 2020-11-13 ENCOUNTER — Encounter: Payer: Self-pay | Admitting: Internal Medicine

## 2020-11-13 VITALS — BP 100/68 | HR 99 | Temp 97.7°F | Ht 65.0 in | Wt 288.0 lb

## 2020-11-13 DIAGNOSIS — E118 Type 2 diabetes mellitus with unspecified complications: Secondary | ICD-10-CM

## 2020-11-13 DIAGNOSIS — M170 Bilateral primary osteoarthritis of knee: Secondary | ICD-10-CM

## 2020-11-13 DIAGNOSIS — I10 Essential (primary) hypertension: Secondary | ICD-10-CM | POA: Diagnosis not present

## 2020-11-13 LAB — POCT GLYCOSYLATED HEMOGLOBIN (HGB A1C): Hemoglobin A1C: 7.2 % — AB (ref 4.0–5.6)

## 2020-11-13 MED ORDER — PENNSAID 2 % EX SOLN: 2.0000 "application " | Freq: Two times a day (BID) | CUTANEOUS | 5 refills | Status: DC

## 2020-11-13 NOTE — Progress Notes (Signed)
Date:  11/13/2020   Name:  Joy Roberts   DOB:  09-26-1960   MRN:  [id number]   Chief Complaint: Diabetes  Diabetes She presents for her follow-up diabetic visit. She has type 2 diabetes mellitus. Her disease course has been stable. Pertinent negatives for hypoglycemia include no headaches or tremors. Pertinent negatives for diabetes include no chest pain, no fatigue, no polydipsia and no polyuria. Current diabetic treatment includes oral agent (triple therapy) (metformin, glucotrol, Trulicity). She is compliant with treatment all of the time. An ACE inhibitor/angiotensin II receptor blocker is being taken.  Hypertension This is a chronic problem. The problem is controlled. Pertinent negatives include no chest pain, headaches, palpitations or shortness of breath. Past treatments include ACE inhibitors, calcium channel blockers and diuretics.  Knee Pain  There was no injury mechanism. The pain is present in the left knee and right knee. The quality of the pain is described as burning and aching. The pain is moderate. The pain has been fluctuating since onset. Pertinent negatives include no numbness. The symptoms are aggravated by movement and weight bearing. She has tried acetaminophen for the symptoms. The treatment provided no relief.    Lab Results  Component Value Date   CREATININE 1.14 (H) 07/10/2020   BUN 12 07/10/2020   NA 141 07/10/2020   K 4.4 07/10/2020   CL 100 07/10/2020   CO2 25 07/10/2020   Lab Results  Component Value Date   CHOL 126 01/18/2020   HDL 40 01/18/2020   LDLCALC 69 01/18/2020   TRIG 90 01/18/2020   CHOLHDL 3.2 01/18/2020   Lab Results  Component Value Date   TSH 2.690 01/18/2020   Lab Results  Component Value Date   HGBA1C 7.2 (A) 11/13/2020   Lab Results  Component Value Date   WBC 8.0 01/18/2020   HGB 13.2 01/18/2020   HCT 39.9 01/18/2020   MCV 85 01/18/2020   PLT 373 01/18/2020   Lab Results  Component Value Date   ALT 20  01/18/2020   AST 20 01/18/2020   ALKPHOS 88 01/18/2020   BILITOT 0.3 01/18/2020     Review of Systems  Constitutional: Negative for appetite change, fatigue, fever and unexpected weight change.  HENT: Negative for tinnitus and trouble swallowing.   Eyes: Positive for visual disturbance (floaters since cataracts surgery).  Respiratory: Negative for cough, chest tightness and shortness of breath.   Cardiovascular: Negative for chest pain, palpitations and leg swelling.  Gastrointestinal: Negative for abdominal pain.  Endocrine: Negative for polydipsia and polyuria.  Genitourinary: Negative for dysuria and hematuria.  Musculoskeletal: Positive for arthralgias, gait problem and joint swelling.  Neurological: Negative for tremors, numbness and headaches.  Psychiatric/Behavioral: Negative for dysphoric mood.    Patient Active Problem List   Diagnosis Date Noted  . Lipoma of left upper extremity 07/10/2020  . Pseudophakia of left eye 05/21/2020  . Pseudophakia of right eye 05/14/2020  . 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 Mild intermittent asthma without complication 50/93/2671  . Calculus of kidney 09/26/2017  . BMI 45.0-49.9, adult ([place]) 09/26/2017  . Osteoarthritis of multiple joints 03/31/2016  . Type II diabetes mellitus with complication ([place]) 24/58/0998  . Essential hypertension 09/10/2014  . Varicose veins of both lower extremities 03/27/2014  . Umbilical hernia 33/82/5053  . Hyperlipidemia associated with type 2 diabetes mellitus ([place]) 05/10/2012    Allergies  Allergen Reactions  . Codeine Nausea And Vomiting  . Sulfa Antibiotics Nausea And Vomiting    Past  Surgical History:  Procedure Laterality Date  . CESAREAN SECTION  2003  . LITHOTRIPSY  01/11/2014    Social History   Tobacco Use  . Smoking status: Never Smoker  . Smokeless tobacco: Never Used  Vaping Use  . Vaping Use: Never used  Substance Use Topics  . Alcohol use: No    Alcohol/week: 0.0 standard  drinks  . Drug use: No     Medication list has been reviewed and updated.  Current Meds  Medication Sig  . albuterol (VENTOLIN HFA) 108 (90 Base) MCG/ACT inhaler Ventolin HFA 90 mcg/actuation aerosol inhaler  . amLODipine-benazepril (LOTREL) 5-20 MG capsule Take 1 capsule by mouth daily.  Marland Kitchen aspirin EC 81 MG tablet Take by mouth.  Marland Kitchen atorvastatin (LIPITOR) 40 MG tablet Take 1 tablet (40 mg total) by mouth daily.  . Diclofenac Sodium (PENNSAID) 2 % SOLN Apply 2 application topically in the morning and at bedtime. To both knees  . Dulaglutide (TRULICITY) 3 SA/6.3KZ SOPN Inject 0.5 mLs (3 mg total) as directed once a week.  Marland Kitchen glipiZIDE (GLUCOTROL XL) 2.5 MG 24 hr tablet Take 1 tablet (2.5 mg total) by mouth daily.  . hydrochlorothiazide (HYDRODIURIL) 25 MG tablet Take 1 tablet (25 mg total) by mouth daily.  . metFORMIN (GLUCOPHAGE-XR) 500 MG 24 hr tablet TAKE 2 TABLETS BY MOUTH TWICE A DAY    PHQ 2/9 Scores 11/13/2020 07/10/2020 01/18/2020 06/12/2019  PHQ - 2 Score 0 0 0 0  PHQ- 9 Score 2 1 0 -    GAD 7 : Generalized Anxiety Score 11/13/2020 07/10/2020 01/18/2020  Nervous, Anxious, on Edge 0 0 1  Control/stop worrying 0 0 0  Worry too much - different things 0 0 0  Trouble relaxing 0 0 0  Restless 0 0 0  Easily annoyed or irritable 0 0 0  Afraid - awful might happen 0 0 0  Total GAD 7 Score 0 0 1  Anxiety Difficulty - Not difficult at all Not difficult at all    BP Readings from Last 3 Encounters:  11/13/20 100/68  07/10/20 112/64  01/18/20 112/78    Physical Exam Vitals and nursing note reviewed.  Constitutional:      General: She is not in acute distress.    Appearance: She is well-developed.  HENT:     Head: Normocephalic and atraumatic.  Cardiovascular:     Rate and Rhythm: Normal rate and regular rhythm.     Pulses: Normal pulses.  Pulmonary:     Effort: Pulmonary effort is normal. No respiratory distress.     Breath sounds: No wheezing or rhonchi.  Musculoskeletal:      Cervical back: Normal range of motion.     Right knee: Swelling, bony tenderness and crepitus present. No effusion or erythema. Decreased range of motion.     Left knee: Swelling, bony tenderness and crepitus present. No effusion or erythema. Decreased range of motion.     Right lower leg: No edema.     Left lower leg: No edema.  Lymphadenopathy:     Cervical: No cervical adenopathy.  Skin:    General: Skin is warm and dry.     Findings: No rash.  Neurological:     Mental Status: She is alert and oriented to person, place, and time.  Psychiatric:        Mood and Affect: Mood normal.        Behavior: Behavior normal.     Wt Readings from Last 3 Encounters:  11/13/20  288 lb (130.6 kg)  07/10/20 285 lb (129.3 kg)  01/18/20 280 lb (127 kg)    BP 100/68   Pulse 99   Temp 97.7 F (36.5 C) (Oral)   Ht 5\' 5"  (1.651 m)   Wt 288 lb (130.6 kg)   SpO2 97%   BMI 47.93 kg/m   Assessment and Plan: 1. Type II diabetes mellitus with complication (HCC) Clinically stable by exam and report without s/s of hypoglycemia. DM complicated by htn. Tolerating medications well without side effects or other concerns. - POCT HgB A1C = 7.2  2. Essential hypertension Clinically stable exam with well controlled BP. Tolerating medications without side effects at this time. Pt to continue current regimen and low sodium diet; benefits of regular exercise as able discussed.  4. Primary osteoarthritis of both knees Surgery is being delayed for multiple reasons Using a cane to ambulate due to pain Will try Pennsaid 2% - sample and Rx sent - Diclofenac Sodium (PENNSAID) 2 % SOLN; Apply 2 application topically in the morning and at bedtime. To both knees  Dispense: 224 g; Refill: 5   Partially dictated using Editor, commissioning. Any errors are unintentional.  Halina Maidens, MD [facility]  11/13/2020

## 2020-11-19 ENCOUNTER — Other Ambulatory Visit: Payer: Self-pay | Admitting: Internal Medicine

## 2020-11-19 DIAGNOSIS — E1169 Type 2 diabetes mellitus with other specified complication: Secondary | ICD-10-CM

## 2021-01-20 NOTE — Progress Notes (Signed)
Date:  01/21/2021   Name:  Joy Roberts   DOB:  [birth year]   MRN:  [id number]   Chief Complaint: Annual Exam (Breast exam. No pap. Foot Exam. )  Joy Roberts is a 60 y.o. female who presents today for her Complete Annual Exam. She feels well. She reports exercising - not at this time. She reports she is sleeping well. Breast complaints - none.  Knee pain is worsening - now in PT and likely having TKA in the near future.  Mammogram: none DEXA: none Pap smear: 01/2011 Colonoscopy: none  Immunization History  Administered Date(s) Administered  . Influenza,inj,Quad PF,6+ Mos 09/28/2017, 05/31/2018, 06/12/2019, 07/10/2020  . PFIZER(Purple Top)SARS-COV-2 Vaccination 12/09/2019, 01/02/2020, 09/19/2020  . Pneumococcal Polysaccharide-23 03/01/2013  . Tdap 05/10/2012    Hypertension This is a chronic problem. The problem is controlled. Pertinent negatives include no chest pain, headaches, palpitations or shortness of breath. Past treatments include ACE inhibitors, calcium channel blockers and diuretics. There are no compliance problems.   Diabetes She presents for her follow-up diabetic visit. She has type 2 diabetes mellitus. Her disease course has been stable. Pertinent negatives for hypoglycemia include no dizziness, headaches, nervousness/anxiousness or tremors. Pertinent negatives for diabetes include no chest pain, no fatigue, no polydipsia and no polyuria. Current diabetic treatment includes oral agent (triple therapy) (Trulicity, metformin, glipizide). She is compliant with treatment all of the time. There is no change in her home blood glucose trend. Her breakfast blood glucose is taken between 6-7 am. Her breakfast blood glucose range is generally 90-110 mg/dl. An ACE inhibitor/angiotensin II receptor blocker is being taken. She does not see a podiatrist.Eye exam is current.  Hyperlipidemia This is a chronic problem. The problem is controlled. Pertinent negatives include no  chest pain or shortness of breath. Current antihyperlipidemic treatment includes statins.    Lab Results  Component Value Date   CREATININE 1.14 (H) 07/10/2020   BUN 12 07/10/2020   NA 141 07/10/2020   K 4.4 07/10/2020   CL 100 07/10/2020   CO2 25 07/10/2020   Lab Results  Component Value Date   CHOL 126 01/18/2020   HDL 40 01/18/2020   LDLCALC 69 01/18/2020   TRIG 90 01/18/2020   CHOLHDL 3.2 01/18/2020   Lab Results  Component Value Date   TSH 2.690 01/18/2020   Lab Results  Component Value Date   HGBA1C 7.2 (A) 11/13/2020   Lab Results  Component Value Date   WBC 8.0 01/18/2020   HGB 13.2 01/18/2020   HCT 39.9 01/18/2020   MCV 85 01/18/2020   PLT 373 01/18/2020   Lab Results  Component Value Date   ALT 20 01/18/2020   AST 20 01/18/2020   ALKPHOS 88 01/18/2020   BILITOT 0.3 01/18/2020     Review of Systems  Constitutional: Negative for chills, fatigue and fever.  HENT: Negative for congestion, hearing loss, tinnitus, trouble swallowing and voice change.   Eyes: Negative for visual disturbance.  Respiratory: Negative for cough, chest tightness, shortness of breath and wheezing.   Cardiovascular: Negative for chest pain, palpitations and leg swelling.  Gastrointestinal: Negative for abdominal pain, constipation, diarrhea and vomiting.  Endocrine: Negative for polydipsia and polyuria.  Genitourinary: Negative for dysuria, frequency, genital sores, vaginal bleeding and vaginal discharge.  Musculoskeletal: Positive for arthralgias and gait problem. Negative for joint swelling.  Skin: Negative for color change and rash.  Neurological: Negative for dizziness, tremors, light-headedness and headaches.  Hematological: Negative for adenopathy. Does not bruise/bleed  easily.  Psychiatric/Behavioral: Negative for dysphoric mood and sleep disturbance. The patient is not nervous/anxious.     Patient Active Problem List   Diagnosis Date Noted  . Lipoma of left upper  extremity 07/10/2020  . Pseudophakia of left eye 05/21/2020  . Pseudophakia of right eye 05/14/2020  . Oral lichen planus 78/29/5621  . Mild intermittent asthma without complication 30/86/5784  . Calculus of kidney 09/26/2017  . BMI 45.0-49.9, adult ([place]) 09/26/2017  . Osteoarthritis of multiple joints 03/31/2016  . Type II diabetes mellitus with complication ([place]) 69/62/9528  . Essential hypertension 09/10/2014  . Varicose veins of both lower extremities 03/27/2014  . Umbilical hernia 41/32/4401  . Hyperlipidemia associated with type 2 diabetes mellitus ([place]) 05/10/2012    Allergies  Allergen Reactions  . Codeine Nausea And Vomiting  . Sulfa Antibiotics Nausea And Vomiting    Past Surgical History:  Procedure Laterality Date  . CESAREAN SECTION  2003  . LITHOTRIPSY  01/11/2014    Social History   Tobacco Use  . Smoking status: Never Smoker  . Smokeless tobacco: Never Used  Vaping Use  . Vaping Use: Never used  Substance Use Topics  . Alcohol use: No    Alcohol/week: 0.0 standard drinks  . Drug use: No     Medication list has been reviewed and updated.  Current Meds  Medication Sig  . albuterol (ACCUNEB) 1.25 MG/3ML nebulizer solution Take 1 ampule by nebulization every 6 (six) hours as needed for wheezing.  Marland Kitchen albuterol (VENTOLIN HFA) 108 (90 Base) MCG/ACT inhaler Ventolin HFA 90 mcg/actuation aerosol inhaler  . amLODipine-benazepril (LOTREL) 5-20 MG capsule Take 1 capsule by mouth daily.  Marland Kitchen aspirin EC 81 MG tablet Take by mouth.  Marland Kitchen atorvastatin (LIPITOR) 40 MG tablet Take 1 tablet (40 mg total) by mouth daily.  Marland Kitchen glipiZIDE (GLUCOTROL XL) 2.5 MG 24 hr tablet Take 1 tablet (2.5 mg total) by mouth daily.  . hydrochlorothiazide (HYDRODIURIL) 25 MG tablet Take 1 tablet (25 mg total) by mouth daily.  . metFORMIN (GLUCOPHAGE-XR) 500 MG 24 hr tablet TAKE 2 TABLETS BY MOUTH TWICE A DAY  . rOPINIRole (REQUIP) 0.25 MG tablet ropinirole 0.25 mg tablet  TAKE 1 TABLET BY  MOUTH EVERY DAY AT BEDTIME FOR 30 DAYS  . TRULICITY 3 UU/7.2ZD SOPN INJECT 0.5 MLS (3 MG TOTAL) AS DIRECTED ONCE A WEEK.    PHQ 2/9 Scores 01/21/2021 11/13/2020 07/10/2020 01/18/2020  PHQ - 2 Score 1 0 0 0  PHQ- 9 Score 2 2 1  0    GAD 7 : Generalized Anxiety Score 01/21/2021 11/13/2020 07/10/2020 01/18/2020  Nervous, Anxious, on Edge 0 0 0 1  Control/stop worrying 0 0 0 0  Worry too much - different things 0 0 0 0  Trouble relaxing 0 0 0 0  Restless 0 0 0 0  Easily annoyed or irritable 0 0 0 0  Afraid - awful might happen 0 0 0 0  Total GAD 7 Score 0 0 0 1  Anxiety Difficulty Not difficult at all - Not difficult at all Not difficult at all    BP Readings from Last 3 Encounters:  01/21/21 118/74  11/13/20 100/68  07/10/20 112/64    Physical Exam Vitals and nursing note reviewed.  Constitutional:      General: She is not in acute distress.    Appearance: She is well-developed.  HENT:     Head: Normocephalic and atraumatic.     Right Ear: Tympanic membrane and ear canal normal.  Left Ear: Tympanic membrane and ear canal normal.     Nose:     Right Sinus: No maxillary sinus tenderness.     Left Sinus: No maxillary sinus tenderness.  Eyes:     General: No scleral icterus.       Right eye: No discharge.        Left eye: No discharge.     Conjunctiva/sclera: Conjunctivae normal.  Neck:     Thyroid: No thyromegaly.     Vascular: No carotid bruit.  Cardiovascular:     Rate and Rhythm: Normal rate and regular rhythm.     Pulses: Normal pulses.     Heart sounds: Normal heart sounds.  Pulmonary:     Effort: Pulmonary effort is normal. No respiratory distress.     Breath sounds: No wheezing.  Chest:  Breasts:     Right: No mass, nipple discharge, skin change or tenderness.     Left: No mass, nipple discharge, skin change or tenderness.    Abdominal:     General: Bowel sounds are normal.     Palpations: Abdomen is soft.     Tenderness: There is no abdominal tenderness.   Musculoskeletal:     Cervical back: Normal range of motion. No erythema.     Right lower leg: No edema.     Left lower leg: No edema.  Lymphadenopathy:     Cervical: No cervical adenopathy.  Skin:    General: Skin is warm and dry.     Findings: No rash.  Neurological:     Mental Status: She is alert and oriented to person, place, and time.     Cranial Nerves: No cranial nerve deficit.     Sensory: No sensory deficit.     Deep Tendon Reflexes: Reflexes are normal and symmetric.  Psychiatric:        Attention and Perception: Attention normal.        Mood and Affect: Mood normal.     Wt Readings from Last 3 Encounters:  01/21/21 286 lb (129.7 kg)  11/13/20 288 lb (130.6 kg)  07/10/20 285 lb (129.3 kg)    BP 118/74   Pulse (!) 104   Temp 98.4 F (36.9 C) (Oral)   Resp 14   Ht 5\' 5"  (1.651 m)   Wt 286 lb (129.7 kg)   SpO2 97%   BMI 47.59 kg/m   Assessment and Plan: 1. Annual physical exam Exam is normal except for weight. Encourage appropriate dietary changes. Begin exercise when able (may be having knee replacement surgery soon)  2. Encounter for screening mammogram for breast cancer Declines mammogram Continue breast self awareness  3. Colon cancer screening Pt declines screening  4. Essential hypertension Clinically stable exam with well controlled BP. Tolerating medications without side effects at this time. Pt to continue current regimen and low sodium diet; benefits of regular exercise as able discussed. - CBC with Differential/Platelet - TSH - POCT urinalysis dipstick - amLODipine-benazepril (LOTREL) 5-20 MG capsule; Take 1 capsule by mouth daily.  Dispense: 90 capsule; Refill: 3 - hydrochlorothiazide (HYDRODIURIL) 25 MG tablet; Take 1 tablet (25 mg total) by mouth daily.  Dispense: 90 tablet; Refill: 3  5. Type II diabetes mellitus with complication (HCC) Clinically stable by exam and report without s/s of hypoglycemia. She is checking her BS  intermittently. DM complicated by HTN. Tolerating medications well without side effects or other concerns. - Comprehensive metabolic panel - Hemoglobin A1c - glipiZIDE (GLUCOTROL XL) 2.5 MG 24 hr  tablet; Take 1 tablet (2.5 mg total) by mouth daily.  Dispense: 90 tablet; Refill: 3  6. Hyperlipidemia associated with type 2 diabetes mellitus ([place]) Tolerating statin medication without side effects at this time LDL is at goal of < 70 on current dose Continue same therapy without change at this time. - Lipid panel - atorvastatin (LIPITOR) 40 MG tablet; Take 1 tablet (40 mg total) by mouth daily.  Dispense: 90 tablet; Refill: 3  7. BMI 45.0-49.9, adult (HCC) Continue dietary changes  8. Primary osteoarthritis involving multiple joints Anticipating TKA in the near future She is advised that she can take Tylenol 1000 mg tid prn   Partially dictated using Editor, commissioning. Any errors are unintentional.  Halina Maidens, MD [facility]  01/21/2021

## 2021-01-21 ENCOUNTER — Ambulatory Visit (INDEPENDENT_AMBULATORY_CARE_PROVIDER_SITE_OTHER): Payer: Managed Care, Other (non HMO) | Admitting: Internal Medicine

## 2021-01-21 ENCOUNTER — Encounter: Payer: Self-pay | Admitting: Internal Medicine

## 2021-01-21 ENCOUNTER — Other Ambulatory Visit: Payer: Self-pay

## 2021-01-21 VITALS — BP 118/74 | HR 104 | Temp 98.4°F | Resp 14 | Ht 65.0 in | Wt 286.0 lb

## 2021-01-21 DIAGNOSIS — E785 Hyperlipidemia, unspecified: Secondary | ICD-10-CM

## 2021-01-21 DIAGNOSIS — M8949 Other hypertrophic osteoarthropathy, multiple sites: Secondary | ICD-10-CM | POA: Diagnosis not present

## 2021-01-21 DIAGNOSIS — Z1231 Encounter for screening mammogram for malignant neoplasm of breast: Secondary | ICD-10-CM

## 2021-01-21 DIAGNOSIS — Z0000 Encounter for general adult medical examination without abnormal findings: Secondary | ICD-10-CM

## 2021-01-21 DIAGNOSIS — I10 Essential (primary) hypertension: Secondary | ICD-10-CM

## 2021-01-21 DIAGNOSIS — M159 Polyosteoarthritis, unspecified: Secondary | ICD-10-CM

## 2021-01-21 DIAGNOSIS — E1169 Type 2 diabetes mellitus with other specified complication: Secondary | ICD-10-CM

## 2021-01-21 DIAGNOSIS — E118 Type 2 diabetes mellitus with unspecified complications: Secondary | ICD-10-CM | POA: Diagnosis not present

## 2021-01-21 DIAGNOSIS — Z1211 Encounter for screening for malignant neoplasm of colon: Secondary | ICD-10-CM | POA: Diagnosis not present

## 2021-01-21 DIAGNOSIS — Z6841 Body Mass Index (BMI) 40.0 and over, adult: Secondary | ICD-10-CM

## 2021-01-21 LAB — POCT URINALYSIS DIPSTICK
Glucose, UA: NEGATIVE
Ketones, UA: 5
Leukocytes, UA: NEGATIVE
Nitrite, UA: NEGATIVE
Protein, UA: POSITIVE — AB
Spec Grav, UA: >=1.03 — AB (ref 1.010–1.025)
Urobilinogen, UA: 0.2 E.U./dL
pH, UA: 5 (ref 5.0–8.0)

## 2021-01-21 MED ORDER — AMLODIPINE BESY-BENAZEPRIL HCL 5-20 MG PO CAPS: 1.0000 | ORAL_CAPSULE | Freq: Every day | ORAL | 3 refills | Status: DC

## 2021-01-21 MED ORDER — ROPINIROLE HCL 0.25 MG PO TABS: ORAL_TABLET | ORAL | 1 refills | Status: DC

## 2021-01-21 MED ORDER — HYDROCHLOROTHIAZIDE 25 MG PO TABS: 25.0000 mg | ORAL_TABLET | Freq: Every day | ORAL | 3 refills | Status: DC

## 2021-01-21 MED ORDER — ATORVASTATIN CALCIUM 40 MG PO TABS: 40.0000 mg | ORAL_TABLET | Freq: Every day | ORAL | 3 refills | Status: DC

## 2021-01-21 MED ORDER — ALBUTEROL SULFATE 1.25 MG/3ML IN NEBU: 1.0000 | INHALATION_SOLUTION | Freq: Four times a day (QID) | RESPIRATORY_TRACT | 1 refills | Status: DC | PRN

## 2021-01-21 MED ORDER — GLIPIZIDE ER 2.5 MG PO TB24: 2.5000 mg | ORAL_TABLET | Freq: Every day | ORAL | 3 refills | Status: DC

## 2021-01-22 LAB — COMPREHENSIVE METABOLIC PANEL
ALT: 40 IU/L — ABNORMAL HIGH (ref 0–32)
AST: 34 IU/L (ref 0–40)
Albumin/Globulin Ratio: 1.6 (ref 1.2–2.2)
Albumin: 4.4 g/dL (ref 3.8–4.9)
Alkaline Phosphatase: 95 IU/L (ref 44–121)
BUN/Creatinine Ratio: 10 (ref 9–23)
BUN: 11 mg/dL (ref 6–24)
Bilirubin Total: 0.4 mg/dL (ref 0.0–1.2)
CO2: 24 mmol/L (ref 20–29)
Calcium: 9.5 mg/dL (ref 8.7–10.2)
Chloride: 99 mmol/L (ref 96–106)
Creatinine, Ser: 1.11 mg/dL — ABNORMAL HIGH (ref 0.57–1.00)
Globulin, Total: 2.8 g/dL (ref 1.5–4.5)
Glucose: 147 mg/dL — ABNORMAL HIGH (ref 65–99)
Potassium: 4.2 mmol/L (ref 3.5–5.2)
Sodium: 138 mmol/L (ref 134–144)
Total Protein: 7.2 g/dL (ref 6.0–8.5)
eGFR: 58 mL/min/{1.73_m2} — ABNORMAL LOW (ref >59)

## 2021-01-22 LAB — CBC WITH DIFFERENTIAL/PLATELET
Basophils Absolute: 0.1 10*3/uL (ref 0.0–0.2)
Basos: 1 %
EOS (ABSOLUTE): 0.7 10*3/uL — ABNORMAL HIGH (ref 0.0–0.4)
Eos: 8 %
Hematocrit: 42.1 % (ref 34.0–46.6)
Hemoglobin: 13.6 g/dL (ref 11.1–15.9)
Immature Grans (Abs): 0 10*3/uL (ref 0.0–0.1)
Immature Granulocytes: 0 %
Lymphocytes Absolute: 2.5 10*3/uL (ref 0.7–3.1)
Lymphs: 30 %
MCH: 28 pg (ref 26.6–33.0)
MCHC: 32.3 g/dL (ref 31.5–35.7)
MCV: 87 fL (ref 79–97)
Monocytes Absolute: 0.7 10*3/uL (ref 0.1–0.9)
Monocytes: 8 %
Neutrophils Absolute: 4.4 10*3/uL (ref 1.4–7.0)
Neutrophils: 53 %
Platelets: 392 10*3/uL (ref 150–450)
RBC: 4.86 x10E6/uL (ref 3.77–5.28)
RDW: 13.2 % (ref 11.7–15.4)
WBC: 8.4 10*3/uL (ref 3.4–10.8)

## 2021-01-22 LAB — HEMOGLOBIN A1C
Est. average glucose Bld gHb Est-mCnc: 180 mg/dL
Hgb A1c MFr Bld: 7.9 % — ABNORMAL HIGH (ref 4.8–5.6)

## 2021-01-22 LAB — LIPID PANEL
Chol/HDL Ratio: 3.5 ratio (ref 0.0–4.4)
Cholesterol, Total: 132 mg/dL (ref 100–199)
HDL: 38 mg/dL — ABNORMAL LOW (ref >39)
LDL Chol Calc (NIH): 71 mg/dL (ref 0–99)
Triglycerides: 126 mg/dL (ref 0–149)
VLDL Cholesterol Cal: 23 mg/dL (ref 5–40)

## 2021-01-22 LAB — TSH: TSH: 3.17 u[IU]/mL (ref 0.450–4.500)

## 2021-03-18 ENCOUNTER — Encounter: Payer: Self-pay | Admitting: Internal Medicine

## 2021-05-03 ENCOUNTER — Other Ambulatory Visit: Payer: Self-pay | Admitting: Internal Medicine

## 2021-05-03 NOTE — Telephone Encounter (Signed)
Requested Prescriptions  Pending Prescriptions Disp Refills  . metFORMIN (GLUCOPHAGE-XR) 500 MG 24 hr tablet [Pharmacy Med Name: METFORMIN HCL ER 500 MG TABLET] 360 tablet 0    Sig: TAKE 2 TABLETS BY MOUTH TWICE A DAY     Endocrinology:  Diabetes - Biguanides Failed - 05/03/2021 12:48 PM      Failed - Cr in normal range and within 360 days    Creatinine  Date Value Ref Range Status  01/13/2014 1.19 0.60 - 1.30 mg/dL Final   Creatinine, Ser  Date Value Ref Range Status  01/21/2021 1.11 (H) 0.57 - 1.00 mg/dL Final         Failed - eGFR in normal range and within 360 days    EGFR (African American)  Date Value Ref Range Status  01/13/2014 >60  Final   GFR calc Af Amer  Date Value Ref Range Status  07/10/2020 61 >59 mL/min/1.73 Final    Comment:    **In accordance with recommendations from the NKF-ASN Task force,**   [facility] is in the process of updating its eGFR calculation to the   2021 CKD-EPI creatinine equation that estimates kidney function   without a race variable.    EGFR (Non-African Amer.)  Date Value Ref Range Status  01/13/2014 53 (L)  Final    Comment:    eGFR values <77m/min/1.73 m2 may be an indication of chronic kidney disease (CKD). Calculated eGFR is useful in patients with stable renal function. The eGFR calculation will not be reliable in acutely ill patients when serum creatinine is changing rapidly. It is not useful in  patients on dialysis. The eGFR calculation may not be applicable to patients at the low and high extremes of body sizes, pregnant women, and vegetarians.    GFR calc non Af Amer  Date Value Ref Range Status  07/10/2020 53 (L) >59 mL/min/1.73 Final   eGFR  Date Value Ref Range Status  01/21/2021 58 (L) >59 mL/min/1.73 Final         Passed - HBA1C is between 0 and 7.9 and within 180 days    Hemoglobin A1C  Date Value Ref Range Status  05/31/2017 7.8  Final   Hgb A1c MFr Bld  Date Value Ref Range Status  01/21/2021 7.9 (H)  4.8 - 5.6 % Final    Comment:             Prediabetes: 5.7 - 6.4          Diabetes: >6.4          Glycemic control for adults with diabetes: <7.0          Passed - Valid encounter within last 6 months    Recent Outpatient Visits          3 months ago [facility]   [facility]   9 months ago [facility] II diabetes mellitus [facility]   [facility]   [facility]      Future Appointments            In 8 months [facility]

## 2021-06-26 ENCOUNTER — Other Ambulatory Visit: Payer: Self-pay | Admitting: Internal Medicine

## 2021-06-26 NOTE — Telephone Encounter (Signed)
Requested Prescriptions  Pending Prescriptions Disp Refills  . rOPINIRole (REQUIP) 0.25 MG tablet [Pharmacy Med Name: ROPINIROLE HCL 0.25 MG TABLET] 90 tablet 2    Sig: TAKE 1 TABLET BY MOUTH AT BEDTIME     Neurology:  Parkinsonian Agents Passed - 06/26/2021  4:43 AM      Passed - Last BP in normal range    BP Readings from Last 1 Encounters:  01/21/21 118/74         Passed - Valid encounter within last 12 months    Recent Outpatient Visits          5 months ago Annual physical exam   [facility]an Hess, MD   7 months ago [facility] oste[facility] of both knees   [facility]   11 months ago Type II diabetes mellitus with complication [facility])   [facility] Glean Hess, MD   1 year ago Annual physical exam   [facility] Glean Hess, MD   2 years ago Essential hypertension   [facility] Glean Hess, MD      Future Appointments            In 7 months Army Melia Jesse Sans, MD [facility], [facility]

## 2021-07-16 ENCOUNTER — Other Ambulatory Visit: Payer: Self-pay | Admitting: Internal Medicine

## 2021-07-16 DIAGNOSIS — E1169 Type 2 diabetes mellitus with other specified complication: Secondary | ICD-10-CM

## 2021-07-16 NOTE — Telephone Encounter (Signed)
Requested Prescriptions  Pending Prescriptions Disp Refills  . TRULICITY 3 TW/6.5KC SOPN [Pharmacy Med Name: TRULICITY 3 LE/7.5 ML PEN] 6 mL 1    Sig: INJECT 0.5 MLS (3 MG TOTAL) AS DIRECTED ONCE A WEEK.     Endocrinology:  Diabetes - GLP-1 Receptor Agonists Passed - 07/16/2021 11:49 AM      Passed - HBA1C is between 0 and 7.9 and within 180 days    Hemoglobin A1C  Date Value Ref Range Status  05/31/2017 7.8  Final   Hgb A1c MFr Bld  Date Value Ref Range Status  01/21/2021 7.9 (H) 4.8 - 5.6 % Final    Comment:             Prediabetes: 5.7 - 6.4          Diabetes: >6.4          Glycemic control for adults with diabetes: <7.0          Passed - Valid encounter within last 6 months    Recent Outpatient Visits          5 months ago Annual physical exam   [facility] Glean Hess, MD   8 months ago Primary osteoarthritis of both knees   [facility] Glean Hess, MD   1 year ago Type II diabetes mellitus with complication [facility])   [facility] Glean Hess, MD   1 year ago Annual physical exam   [facility] Glean Hess, MD   2 years ago Essential hypertension   [facility] Glean Hess, MD      Future Appointments            In 6 months Army Melia Jesse Sans, MD [facility], [facility]

## 2021-08-15 ENCOUNTER — Other Ambulatory Visit: Payer: Self-pay | Admitting: Internal Medicine

## 2021-08-16 NOTE — Telephone Encounter (Signed)
Requested Prescriptions  Pending Prescriptions Disp Refills  . metFORMIN (GLUCOPHAGE-XR) 500 MG 24 hr tablet [Pharmacy Med Name: METFORMIN HCL ER 500 MG TABLET] 120 tablet 0    Sig: TAKE 2 TABLETS BY MOUTH TWICE A DAY     Endocrinology:  Diabetes - Biguanides Failed - 08/15/2021  9:42 AM      Failed - Cr in normal range and within 360 days    Creatinine  Date Value Ref Range Status  01/13/2014 1.19 0.60 - 1.30 mg/dL Final   Creatinine, Ser  Date Value Ref Range Status  01/21/2021 1.11 (H) 0.57 - 1.00 mg/dL Final         Failed - HBA1C is between 0 and 7.9 and within 180 days    Hemoglobin A1C  Date Value Ref Range Status  05/31/2017 7.8  Final   Hgb A1c MFr Bld  Date Value Ref Range Status  01/21/2021 7.9 (H) 4.8 - 5.6 % Final    Comment:             Prediabetes: 5.7 - 6.4          Diabetes: >6.4          Glycemic control for adults with diabetes: <7.0          Failed - eGFR in normal range and within 360 days    EGFR (African American)  Date Value Ref Range Status  01/13/2014 >60  Final   GFR calc Af Amer  Date Value Ref Range Status  07/10/2020 61 >59 mL/min/1.73 Final    Comment:    **In accordance with recommendations from the NKF-ASN Task force,**   [facility] is in the process of updating its eGFR calculation to the   2021 CKD-EPI creatinine equation that estimates kidney function   without a race variable.    EGFR (Non-African Amer.)  Date Value Ref Range Status  01/13/2014 53 (L)  Final    Comment:    eGFR values <32m/min/1.73 m2 may be an indication of chronic kidney disease (CKD). Calculated eGFR is useful in patients with stable renal function. The eGFR calculation will not be reliable in acutely ill patients when serum creatinine is changing rapidly. It is not useful in  patients on dialysis. The eGFR calculation may not be applicable to patients at the low and high extremes of body sizes, pregnant women, and vegetarians.    GFR calc non Af Amer   Date Value Ref Range Status  07/10/2020 53 (L) >59 mL/min/1.73 Final   eGFR  Date Value Ref Range Status  01/21/2021 58 (L) >59 mL/min/1.73 Final         Failed - Valid encounter within last 6 months    Recent Outpatient Visits          6 months ago [facility]   9 months ago [facility]   1 year [facility]   [facility]   2 years ago Essential hypertension   [facility]      Future Appointments            In 5 months BArmy MeliaLJesse Sans MD [facility]

## 2021-08-19 ENCOUNTER — Telehealth: Payer: Self-pay | Admitting: Internal Medicine

## 2021-08-19 NOTE — Telephone Encounter (Signed)
Pt has not been able to get her Rx for TRULICITY 3 IT/9.4FX SOPN  / she stated no pharmacy has it and would like to speak with the nurse or Dr. Army Melia to find out what she needs to do / please advise asap

## 2021-08-28 ENCOUNTER — Ambulatory Visit: Payer: Managed Care, Other (non HMO) | Admitting: Internal Medicine

## 2021-08-28 ENCOUNTER — Telehealth: Payer: Self-pay

## 2021-08-28 ENCOUNTER — Other Ambulatory Visit: Payer: Self-pay

## 2021-08-28 ENCOUNTER — Encounter: Payer: Self-pay | Admitting: Internal Medicine

## 2021-08-28 VITALS — BP 138/74 | HR 109 | Ht 65.0 in | Wt 276.2 lb

## 2021-08-28 DIAGNOSIS — Z23 Encounter for immunization: Secondary | ICD-10-CM | POA: Diagnosis not present

## 2021-08-28 DIAGNOSIS — J4520 Mild intermittent asthma, uncomplicated: Secondary | ICD-10-CM

## 2021-08-28 DIAGNOSIS — M159 Polyosteoarthritis, unspecified: Secondary | ICD-10-CM | POA: Diagnosis not present

## 2021-08-28 DIAGNOSIS — E118 Type 2 diabetes mellitus with unspecified complications: Secondary | ICD-10-CM | POA: Diagnosis not present

## 2021-08-28 LAB — POCT GLYCOSYLATED HEMOGLOBIN (HGB A1C): Hemoglobin A1C: 6.3 % — AB (ref 4.0–5.6)

## 2021-08-28 MED ORDER — AEROCHAMBER MV MISC
0 refills | Status: AC
Start: 2021-08-28 — End: ?

## 2021-08-28 MED ORDER — ALBUTEROL SULFATE 1.25 MG/3ML IN NEBU: 1.0000 | INHALATION_SOLUTION | Freq: Four times a day (QID) | RESPIRATORY_TRACT | 5 refills | Status: DC | PRN

## 2021-08-28 MED ORDER — FLUTICASONE-SALMETEROL 250-50 MCG/ACT IN AEPB: 1.0000 | INHALATION_SPRAY | Freq: Two times a day (BID) | RESPIRATORY_TRACT | 5 refills | Status: DC

## 2021-08-28 NOTE — Progress Notes (Signed)
Date:  08/28/2021   Name:  Joy Roberts   DOB:  July 03, 1961   MRN:  [id number]   Chief Complaint: Diabetes  Diabetes She presents for her follow-up diabetic visit. She has type 2 diabetes mellitus. Her disease course has been fluctuating. Associated symptoms include fatigue. Pertinent negatives for diabetes include no chest pain, no foot ulcerations and no visual change. Risk factors for coronary artery disease include diabetes mellitus and obesity. She is following a generally unhealthy diet. She rarely participates in exercise. She does not see a podiatrist.Eye exam is not current.  Asthma She complains of cough, hoarse voice and wheezing. This is a chronic problem. The problem occurs daily. The problem has been gradually worsening (since Covid). Pertinent negatives include no chest pain. Her past medical history is significant for asthma. Patient has been out of Trulicity for one week due to the pharmacy not having this in stock. She was able to get a box of Trulicity today from the pharmacy but has not started on this injection yet.  Lab Results  Component Value Date   NA 138 01/21/2021   K 4.2 01/21/2021   CO2 24 01/21/2021   GLUCOSE 147 (H) 01/21/2021   BUN 11 01/21/2021   CREATININE 1.11 (H) 01/21/2021   CALCIUM 9.5 01/21/2021   EGFR 58 (L) 01/21/2021   GFRNONAA 53 (L) 07/10/2020   Lab Results  Component Value Date   CHOL 132 01/21/2021   HDL 38 (L) 01/21/2021   LDLCALC 71 01/21/2021   TRIG 126 01/21/2021   CHOLHDL 3.5 01/21/2021   Lab Results  Component Value Date   TSH 3.170 01/21/2021   Lab Results  Component Value Date   HGBA1C 6.3 (A) 08/28/2021   Lab Results  Component Value Date   WBC 8.4 01/21/2021   HGB 13.6 01/21/2021   HCT 42.1 01/21/2021   MCV 87 01/21/2021   PLT 392 01/21/2021   Lab Results  Component Value Date   ALT 40 (H) 01/21/2021   AST 34 01/21/2021   ALKPHOS 95 01/21/2021   BILITOT 0.4 01/21/2021   No results found for:  25OHVITD2, 25OHVITD3, VD25OH   Review of Systems  Constitutional:  Positive for fatigue. Negative for chills, diaphoresis and unexpected weight change.  HENT:  Positive for hoarse voice.   Eyes:  Negative for visual disturbance.  Respiratory:  Positive for cough and wheezing.   Cardiovascular:  Negative for chest pain and palpitations.  Musculoskeletal:  Positive for arthralgias and gait problem.   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   Mild intermittent asthma without complication 50/38/8828   Calculus of kidney 09/26/2017   BMI 45.0-49.9, adult ([place]) 09/26/2017   Osteoarthritis of multiple joints 03/31/2016   Type II diabetes mellitus with complication ([place]) 00/34/9179   Essential hypertension 09/10/2014   Varicose veins of both lower extremities 15/01/6978   Umbilical hernia 48/09/6551   Hyperlipidemia associated with type 2 diabetes mellitus ([place]) 05/10/2012    Allergies  Allergen Reactions   Codeine Nausea And Vomiting   Sulfa Antibiotics Nausea And Vomiting    Past Surgical History:  Procedure Laterality Date   CESAREAN SECTION  2003   LITHOTRIPSY  01/11/2014    Social History   Tobacco Use   Smoking status: Never   Smokeless tobacco: Never  Vaping Use  Vaping Use: Never used  Substance Use Topics   Alcohol use: No    Alcohol/week: 0.0 standard drinks   Drug use: No     Medication list has been reviewed and updated.  Current Meds  Medication Sig   albuterol (ACCUNEB) 1.25 MG/3ML nebulizer solution Take 3 mLs (1.25 mg total) by nebulization every 6 (six) hours as needed for wheezing.   albuterol (VENTOLIN HFA) 108 (90 Base) MCG/ACT inhaler Ventolin HFA 90 mcg/actuation aerosol inhaler   amLODipine-benazepril (LOTREL) 5-20 MG capsule Take 1 capsule by mouth daily.   aspirin EC 81 MG  tablet Take by mouth.   atorvastatin (LIPITOR) 40 MG tablet Take 1 tablet (40 mg total) by mouth daily.   glipiZIDE (GLUCOTROL XL) 2.5 MG 24 hr tablet Take 1 tablet (2.5 mg total) by mouth daily.   hydrochlorothiazide (HYDRODIURIL) 25 MG tablet Take 1 tablet (25 mg total) by mouth daily.   metFORMIN (GLUCOPHAGE-XR) 500 MG 24 hr tablet TAKE 2 TABLETS BY MOUTH TWICE A DAY   rOPINIRole (REQUIP) 0.25 MG tablet TAKE 1 TABLET BY MOUTH AT BEDTIME   traMADol (ULTRAM) 50 MG tablet Take 1 tablet by mouth every 6 (six) hours as needed.   TRULICITY 3 IP/3.8SN SOPN INJECT 0.5 MLS (3 MG TOTAL) AS DIRECTED ONCE A WEEK.    PHQ 2/9 Scores 08/28/2021 01/21/2021 11/13/2020 07/10/2020  PHQ - 2 Score 1 1 0 0  PHQ- 9 Score _0 GAD 7 : Generalized Anxiety Score 08/28/2021 01/21/2021 11/13/2020 07/10/2020  Nervous, Anxious, on Edge 0 0 0 0  Control/stop worrying 0 0 0 0  Worry too much - different things 0 0 0 0  Trouble relaxing 1 0 0 0  Restless 0 0 0 0  Easily annoyed or irritable 0 0 0 0  Afraid - awful might happen 0 0 0 0  Total GAD 7 Score 1 0 0 0  Anxiety Difficulty Not difficult at all Not difficult at all - Not difficult at all    BP Readings from Last 3 Encounters:  08/28/21 138/74  01/21/21 118/74  11/13/20 100/68    Physical Exam Vitals and nursing note reviewed.  Constitutional:      General: She is not in acute distress.    Appearance: She is well-developed. She is obese.  HENT:     Head: Normocephalic and atraumatic.  Cardiovascular:     Rate and Rhythm: Normal rate and regular rhythm.     Pulses: Normal pulses.  Pulmonary:     Effort: Pulmonary effort is normal. No respiratory distress.     Breath sounds: No wheezing or rhonchi.  Musculoskeletal:     Cervical back: Normal range of motion.  Skin:    General: Skin is warm and dry.     Findings: No rash.  Neurological:     General: No focal deficit present.     Mental Status: She is alert and oriented to person, place, and  time.  Psychiatric:        Mood and Affect: Mood normal.        Behavior: Behavior normal.    Wt Readings from Last 3 Encounters:  08/28/21 276 lb 3.2 oz (125.3 kg)  01/21/21 286 lb (129.7 kg)  11/13/20 288 lb (130.6 kg)    BP 138/74    Pulse (!) 109    Ht _1  (1.651 m)    Wt 276 lb 3.2 oz (125.3 kg)    SpO2 98%  BMI 45.96 kg/m   Assessment and Plan: 1. Type II diabetes mellitus with complication (HCC) Clinically stable by exam and report without s/s of hypoglycemia. DM complicated by hypertension and dyslipidemia. Tolerating medications well without side effects or other concerns. Having trouble keeping Trulicity at the pharmacy - has skipped the past week. If this continues, the alternative is Rybelsus. - POCT glycosylated hemoglobin (Hb A1C) = 6.3  2. Mild intermittent asthma without complication Recent Covid infection has caused some increase in symptoms Will add Advair - spacer sent but is not needed - albuterol (ACCUNEB) 1.25 MG/3ML nebulizer solution; Take 3 mLs (1.25 mg total) by nebulization every 6 (six) hours as needed for wheezing.  Dispense: 75 mL; Refill: 5 - fluticasone-salmeterol (ADVAIR DISKUS) 250-50 MCG/ACT AEPB; Inhale 1 puff into the lungs in the morning and at bedtime.  Dispense: 60 each; Refill: 5 - Spacer/Aero-Holding Chambers (AEROCHAMBER MV) inhaler; Use as instructed  Dispense: 1 each; Refill: 0  3. Need for immunization against influenza - Flu Vaccine QUAD 42moIM (Fluarix, Fluzone & Alfiuria Quad PF)  4. Primary osteoarthritis involving multiple joints Starting PTx and will likely need surgery Recommend continued good BS control along with weight loss to optimize outcomes   Partially dictated using Dragon software. Any errors are unintentional.  LHalina Maidens MD [facility]  08/28/2021

## 2021-08-28 NOTE — Telephone Encounter (Signed)
Called pt to let her know that the new inhaler advair that Dr. Army Melia sent in the chamber is not covered with it.  She would just need to use the inlaher by itself . If patient wants the chamber still to use with the albuterol inhaler that she already has we can send it in for her.   [facility] nurse may give results to patient if they return call to clinic, a CRM has been created.  KP

## 2021-08-29 NOTE — Telephone Encounter (Signed)
Patient states she did get call from PCP yesterday and all is taken care of.

## 2021-09-16 ENCOUNTER — Other Ambulatory Visit: Payer: Self-pay | Admitting: Internal Medicine

## 2021-09-17 NOTE — Telephone Encounter (Signed)
Requested Prescriptions  Pending Prescriptions Disp Refills  . metFORMIN (GLUCOPHAGE-XR) 500 MG 24 hr tablet [Pharmacy Med Name: METFORMIN HCL ER 500 MG TABLET] 120 tablet 5    Sig: TAKE 2 TABLETS BY MOUTH TWICE A DAY     Endocrinology:  Diabetes - Biguanides Failed - 09/16/2021  9:32 AM      Failed - Cr in normal range and within 360 days    Creatinine  Date Value Ref Range Status  01/13/2014 1.19 0.60 - 1.30 mg/dL Final   Creatinine, Ser  Date Value Ref Range Status  01/21/2021 1.11 (H) 0.57 - 1.00 mg/dL Final         Failed - eGFR in normal range and within 360 days    EGFR (African American)  Date Value Ref Range Status  01/13/2014 >60  Final   GFR calc Af Amer  Date Value Ref Range Status  07/10/2020 61 >59 mL/min/1.73 Final    Comment:    **In accordance with recommendations from the NKF-ASN Task force,**   [facility] is in the process of updating its eGFR calculation to the   2021 CKD-EPI creatinine equation that estimates kidney function   without a race variable.    EGFR (Non-African Amer.)  Date Value Ref Range Status  01/13/2014 53 (L)  Final    Comment:    eGFR values <71m/min/1.73 m2 may be an indication of chronic kidney disease (CKD). Calculated eGFR is useful in patients with stable renal function. The eGFR calculation will not be reliable in acutely ill patients when serum creatinine is changing rapidly. It is not useful in  patients on dialysis. The eGFR calculation may not be applicable to patients at the low and high extremes of body sizes, pregnant women, and vegetarians.    GFR calc non Af Amer  Date Value Ref Range Status  07/10/2020 53 (L) >59 mL/min/1.73 Final   eGFR  Date Value Ref Range Status  01/21/2021 58 (L) >59 mL/min/1.73 Final         Passed - HBA1C is between 0 and 7.9 and within 180 days    Hemoglobin A1C  Date Value Ref Range Status  08/28/2021 6.3 (A) 4.0 - 5.6 % Final  05/31/2017 7.8  Final   Hgb A1c MFr Bld  Date Value  Ref Range Status  01/21/2021 7.9 (H) 4.8 - 5.6 % Final    Comment:             Prediabetes: 5.7 - 6.4          Diabetes: >6.4          Glycemic control for adults with diabetes: <7.0          Passed - Valid encounter within last 6 months    Recent Outpatient Visits          2 weeks ago Type [facility]   7 months [facility]   10 months ago [facility] of both knees   [facility]   1 year ago Type II diabetes mellitus with complication ([facility]   1 year ago [facility]      Future Appointments            In 2 months BArmy Melia  Jesse Sans, MD [facility] [place]   In 4 months Army Melia Jesse Sans, MD [place]

## 2021-11-28 ENCOUNTER — Ambulatory Visit: Payer: Managed Care, Other (non HMO) | Admitting: Internal Medicine

## 2021-12-13 ENCOUNTER — Other Ambulatory Visit: Payer: Self-pay | Admitting: Internal Medicine

## 2021-12-13 DIAGNOSIS — I10 Essential (primary) hypertension: Secondary | ICD-10-CM

## 2021-12-15 ENCOUNTER — Other Ambulatory Visit: Payer: Self-pay | Admitting: Internal Medicine

## 2021-12-15 DIAGNOSIS — E118 Type 2 diabetes mellitus with unspecified complications: Secondary | ICD-10-CM

## 2021-12-15 NOTE — Telephone Encounter (Signed)
Requested medication (s) are due for refill today:   Yes ? ?Requested medication (s) are on the active medication list:   Yes ? ?Future visit scheduled:   Yes ? ? ?Last ordered: Returned because labs out of range and due  ? ?Requested Prescriptions  ?Pending Prescriptions Disp Refills  ? hydrochlorothiazide (HYDRODIURIL) 25 MG tablet [Pharmacy Med Name: HYDROCHLOROTHIAZIDE 25 MG TAB] 90 tablet 3  ?  Sig: Take 1 tablet (25 mg total) by mouth daily.  ?  ? Cardiovascular: Diuretics - Thiazide Failed - 12/13/2021  9:08 AM  ?  ?  Failed - Cr in normal range and within 180 days  ?  Creatinine  ?Date Value Ref Range Status  ?01/13/2014 1.19 0.60 - 1.30 mg/dL Final  ? ?Creatinine, Ser  ?Date Value Ref Range Status  ?01/21/2021 1.11 (H) 0.57 - 1.00 mg/dL Final  ?  ?  ?  ?  Failed - K in normal range and within 180 days  ?  Potassium  ?Date Value Ref Range Status  ?01/21/2021 4.2 3.5 - 5.2 mmol/L Final  ?01/13/2014 3.4 (L) 3.5 - 5.1 mmol/L Final  ?  ?  ?  ?  Failed - Na in normal range and within 180 days  ?  Sodium  ?Date Value Ref Range Status  ?01/21/2021 138 134 - 144 mmol/L Final  ?01/13/2014 135 (L) 136 - 145 mmol/L Final  ?  ?  ?  ?  Passed - Last BP in normal range  ?  BP Readings from Last 1 Encounters:  ?08/28/21 138/74  ?  ?  ?  ?  Passed - Valid encounter within last 6 months  ?  Recent Outpatient Visits   ? ?      ? 3 months ago Type II diabetes mellitus with complication ([place])  ? [facility] Glean Hess, MD  ? 10 months ago Annual physical exam  ? [facility] Glean Hess, MD  ? 1 year ago Primary osteoarthritis of both knees  ? [facility], MD  ? 1 year ago Type II diabetes mellitus with complication [facility])  ? [facility] Glean Hess, MD  ? 1 year ago Annual physical exam  ? [facility] Glean Hess, MD  ? ?  ?  ?Future Appointments   ? ?        ? In 1 month Army Melia Jesse Sans, MD [facility] [place]  ? ?  ? ?  ?  ?   ?Alfred Levins ?

## 2021-12-16 ENCOUNTER — Other Ambulatory Visit: Payer: Self-pay | Admitting: Internal Medicine

## 2021-12-16 DIAGNOSIS — E1169 Type 2 diabetes mellitus with other specified complication: Secondary | ICD-10-CM

## 2021-12-16 NOTE — Telephone Encounter (Signed)
Requested Prescriptions  ?Pending Prescriptions Disp Refills  ?? glipiZIDE (GLUCOTROL XL) 2.5 MG 24 hr tablet [Pharmacy Med Name: GLIPIZIDE ER 2.5 MG TABLET] 90 tablet 3  ?  Sig: TAKE 1 TABLET BY MOUTH EVERY DAY  ?  ? Endocrinology:  Diabetes - Sulfonylureas Failed - 12/15/2021  8:12 AM  ?  ?  Failed - Cr in normal range and within 360 days  ?  Creatinine  ?Date Value Ref Range Status  ?01/13/2014 1.19 0.60 - 1.30 mg/dL Final  ? ?Creatinine, Ser  ?Date Value Ref Range Status  ?01/21/2021 1.11 (H) 0.57 - 1.00 mg/dL Final  ?   ?  ?  Passed - HBA1C is between 0 and 7.9 and within 180 days  ?  Hemoglobin A1C  ?Date Value Ref Range Status  ?08/28/2021 6.3 (A) 4.0 - 5.6 % Final  ?05/31/2017 7.8  Final  ? ?Hgb A1c MFr Bld  ?Date Value Ref Range Status  ?01/21/2021 7.9 (H) 4.8 - 5.6 % Final  ?  Comment:  ?           Prediabetes: 5.7 - 6.4 ?         Diabetes: >6.4 ?         Glycemic control for adults with diabetes: <7.0 ?  ?   ?  ?  Passed - Valid encounter within last 6 months  ?  Recent Outpatient Visits   ?      ? 3 months ago Type II diabetes mellitus with complication ([place])  ? [facility] Glean Hess, MD  ? 10 months ago Annual physical exam  ? [facility]  ? 1 year ago Primary osteoarthritis of both knees  ? [facility] Glean Hess, MD  ? 1 year ago Type II diabetes mellitus with complication [facility])  ? [facility] Glean Hess, MD  ? 1 year ago Annual physical exam  ? [facility] Glean Hess, MD  ?  ?  ?Future Appointments   ?        ? In 1 month Army Melia Jesse Sans, MD [facility] [place]  ?  ? ?  ?  ?  ? ?

## 2021-12-17 NOTE — Telephone Encounter (Signed)
Requested Prescriptions  ?Pending Prescriptions Disp Refills  ?? atorvastatin (LIPITOR) 40 MG tablet [Pharmacy Med Name: ATORVASTATIN 40 MG TABLET] 90 tablet 0  ?  Sig: TAKE 1 TABLET BY MOUTH EVERY DAY  ?  ? Cardiovascular:  Antilipid - Statins Failed - 12/16/2021  6:27 PM  ?  ?  Failed - Lipid Panel in normal range within the last 12 months  ?  Cholesterol, Total  ?Date Value Ref Range Status  ?01/21/2021 132 100 - 199 mg/dL Final  ? ?LDL Chol Calc (NIH)  ?Date Value Ref Range Status  ?01/21/2021 71 0 - 99 mg/dL Final  ? ?HDL  ?Date Value Ref Range Status  ?01/21/2021 38 (L) >39 mg/dL Final  ? ?Triglycerides  ?Date Value Ref Range Status  ?01/21/2021 126 0 - 149 mg/dL Final  ? ?  ?  ?  Passed - Patient is not pregnant  ?  ?  Passed - Valid encounter within last 12 months  ?  Recent Outpatient Visits   ?      ? 3 months ago Type II diabetes mellitus with complication ([place])  ? [facility] Glean Hess, MD  ? 11 months ago Annual physical exam  ? [facility] Glean Hess, MD  ? 1 year ago Primary osteoarthritis of both knees  ? [facility] Glean Hess, MD  ? 1 year ago Type II diabetes mellitus with complication [facility]  ? [facility] Glean Hess, MD  ? 1 year ago Annual physical exam  ? [facility] Glean Hess, MD  ?  ?  ?Future Appointments   ?        ? In 1 month Army Melia Jesse Sans, MD [facility] [place]  ?  ? ?  ?  ?  ? ? ?

## 2021-12-24 ENCOUNTER — Other Ambulatory Visit: Payer: Self-pay | Admitting: Internal Medicine

## 2021-12-24 DIAGNOSIS — I10 Essential (primary) hypertension: Secondary | ICD-10-CM

## 2021-12-24 NOTE — Telephone Encounter (Signed)
Requested medication (s) are due for refill today: ys ? ?Requested medication (s) are on the active medication list: yes ? ?Last refill:  01/21/21 #90 3 RF ? ?Future visit scheduled: yes ? ?Notes to clinic:  overdue lab work ? ? ?Requested Prescriptions  ?Pending Prescriptions Disp Refills  ? amLODipine-benazepril (LOTREL) 5-20 MG capsule [Pharmacy Med Name: AMLODIPINE-BENAZEPRIL 5-20 MG] 90 capsule 3  ?  Sig: TAKE 1 CAPSULE BY MOUTH EVERY DAY  ?  ? Cardiovascular: CCB + ACEI Combos Failed - 12/24/2021  2:19 AM  ?  ?  Failed - Cr in normal range and within 180 days  ?  Creatinine  ?Date Value Ref Range Status  ?01/13/2014 1.19 0.60 - 1.30 mg/dL Final  ? ?Creatinine, Ser  ?Date Value Ref Range Status  ?01/21/2021 1.11 (H) 0.57 - 1.00 mg/dL Final  ?  ?  ?  ?  Failed - K in normal range and within 180 days  ?  Potassium  ?Date Value Ref Range Status  ?01/21/2021 4.2 3.5 - 5.2 mmol/L Final  ?01/13/2014 3.4 (L) 3.5 - 5.1 mmol/L Final  ?  ?  ?  ?  Failed - Na in normal range and within 180 days  ?  Sodium  ?Date Value Ref Range Status  ?01/21/2021 138 134 - 144 mmol/L Final  ?01/13/2014 135 (L) 136 - 145 mmol/L Final  ?  ?  ?  ?  Failed - eGFR is 30 or above and within 180 days  ?  EGFR (African American)  ?Date Value Ref Range Status  ?01/13/2014 >60  Final  ? ?GFR calc Af Wyvonnia Lora  ?Date Value Ref Range Status  ?07/10/2020 61 >59 mL/min/1.73 Final  ?  Comment:  ?  **In accordance with recommendations from the NKF-ASN Task force,** ?  [facility] is in the process of updating its eGFR calculation to the ?  2021 CKD-EPI creatinine equation that estimates kidney function ?  without a race variable. ?  ? ?EGFR (Non-African Amer.)  ?Date Value Ref Range Status  ?01/13/2014 53 (L)  Final  ?  Comment:  ?  eGFR values <57mL/min/1.73 m2 may be an indication of chronic ?kidney disease (CKD). ?Calculated eGFR is useful in patients with stable renal function. ?The eGFR calculation will not be reliable in acutely ill patients ?when serum  creatinine is changing rapidly. It is not useful in  ?patients on dialysis. The eGFR calculation may not be applicable ?to patients at the low and high extremes of body sizes, pregnant ?women, and vegetarians. ?  ? ?GFR calc non Af Amer  ?Date Value Ref Range Status  ?07/10/2020 53 (L) >59 mL/min/1.73 Final  ? ?eGFR  ?Date Value Ref Range Status  ?01/21/2021 58 (L) >59 mL/min/1.73 Final  ?  ?  ?  ?  Passed - Patient is not pregnant  ?  ?  Passed - Last BP in normal range  ?  BP Readings from Last 1 Encounters:  ?08/28/21 138/74  ?  ?  ?  ?  Passed - Valid encounter within last 6 months  ?  Recent Outpatient Visits   ? ?      ? 3 months ago Type II diabetes mellitus with complication ([place])  ? [facility] Glean Hess, MD  ? 11 months ago Annual physical exam  ? [facility] Glean Hess, MD  ? 1 year ago Primary osteoarthritis of both knees  ? [facility] Glean Hess, MD  ?  1 year ago Type II diabetes mellitus with complication ([place])  ? [facility] Glean Hess, MD  ? 1 year ago Annual physical exam  ? [facility] Glean Hess, MD  ? ?  ?  ?Future Appointments   ? ?        ? In 1 month Army Melia Jesse Sans, MD [facility] [place]  ? ?  ? ?  ?  ?  ? ? ? ? ?

## 2022-01-10 ENCOUNTER — Other Ambulatory Visit: Payer: Self-pay | Admitting: Internal Medicine

## 2022-01-10 DIAGNOSIS — I10 Essential (primary) hypertension: Secondary | ICD-10-CM

## 2022-01-12 NOTE — Telephone Encounter (Signed)
Requested medication (s) are due for refill today: Yes ? ?Requested medication (s) are on the active medication list: Yes ? ?Last refill:  01/21/21 ? ?Future visit scheduled: Yes ? ?Notes to clinic:  Pharmacy requests diagnosis code/protocol indicates lab work needed. ? ? ? ?Requested Prescriptions  ?Pending Prescriptions Disp Refills  ? hydrochlorothiazide (HYDRODIURIL) 25 MG tablet [Pharmacy Med Name: HYDROCHLOROTHIAZIDE 25 MG TAB] 90 tablet 4  ?  Sig: Take 1 tablet (25 mg total) by mouth daily.  ?  ? Cardiovascular: Diuretics - Thiazide Failed - 01/10/2022  1:08 AM  ?  ?  Failed - Cr in normal range and within 180 days  ?  Creatinine  ?Date Value Ref Range Status  ?01/13/2014 1.19 0.60 - 1.30 mg/dL Final  ? ?Creatinine, Ser  ?Date Value Ref Range Status  ?01/21/2021 1.11 (H) 0.57 - 1.00 mg/dL Final  ?  ?  ?  ?  Failed - K in normal range and within 180 days  ?  Potassium  ?Date Value Ref Range Status  ?01/21/2021 4.2 3.5 - 5.2 mmol/L Final  ?01/13/2014 3.4 (L) 3.5 - 5.1 mmol/L Final  ?  ?  ?  ?  Failed - Na in normal range and within 180 days  ?  Sodium  ?Date Value Ref Range Status  ?01/21/2021 138 134 - 144 mmol/L Final  ?01/13/2014 135 (L) 136 - 145 mmol/L Final  ?  ?  ?  ?  Passed - Last BP in normal range  ?  BP Readings from Last 1 Encounters:  ?08/28/21 138/74  ?  ?  ?  ?  Passed - Valid encounter within last 6 months  ?  Recent Outpatient Visits   ? ?      ? 4 months ago Type II diabetes mellitus with complication ([place])  ? [facility] Glean Hess, MD  ? 11 months ago Annual physical exam  ? [facility] Glean [facility], MD  ? 1 year ago Primary osteoarthritis of both knees  ? [facility] Glean Hess, MD  ? 1 year ago Type II diabetes mellitus with complication [facility])  ? [facility] Glean Hess, [facility]  ? 1 year ago Annual physical exam  ? [facility] Glean Hess, MD  ? ?  ?  ?Future Appointments   ? ?        ? In 1 week Glean Hess,  MD [facility], [place]  ? ?  ? ? ?  ?  ?  ? ?

## 2022-01-15 ENCOUNTER — Other Ambulatory Visit: Payer: Self-pay | Admitting: Internal Medicine

## 2022-01-15 DIAGNOSIS — I10 Essential (primary) hypertension: Secondary | ICD-10-CM

## 2022-01-15 NOTE — Telephone Encounter (Signed)
Requested medications are due for refill today.  yes ? ?Requested medications are on the active medications list.  yes ? ?Last refill. 01/21/2021 #90 3 refills ? ?Future visit scheduled.   yes ? ?Notes to clinic.  Pharmacy needs Dx code.  ? ? ? ?Requested Prescriptions  ?Pending Prescriptions Disp Refills  ? hydrochlorothiazide (HYDRODIURIL) 25 MG tablet [Pharmacy Med Name: HYDROCHLOROTHIAZIDE 25 MG TAB] 90 tablet 3  ?  Sig: TAKE 1 TABLET BY MOUTH EVERY DAY  ?  ? Cardiovascular: Diuretics - Thiazide Failed - 01/15/2022  8:04 AM  ?  ?  Failed - Cr in normal range and within 180 days  ?  Creatinine  ?Date Value Ref Range Status  ?01/13/2014 1.19 0.60 - 1.30 mg/dL Final  ? ?Creatinine, Ser  ?Date Value Ref Range Status  ?01/21/2021 1.11 (H) 0.57 - 1.00 mg/dL Final  ?  ?  ?  ?  Failed - K in normal range and within 180 days  ?  Potassium  ?Date Value Ref Range Status  ?01/21/2021 4.2 3.5 - 5.2 mmol/L Final  ?01/13/2014 3.4 (L) 3.5 - 5.1 mmol/L Final  ?  ?  ?  ?  Failed - Na in normal range and within 180 days  ?  Sodium  ?Date Value Ref Range Status  ?01/21/2021 138 134 - 144 mmol/L Final  ?01/13/2014 135 (L) 136 - 145 mmol/L Final  ?  ?  ?  ?  Passed - Last BP in normal range  ?  BP Readings from Last 1 Encounters:  ?08/28/21 138/74  ?  ?  ?  ?  Passed - Valid encounter within last 6 months  ?  Recent Outpatient Visits   ? ?      ? 4 months ago Type II diabetes mellitus with complication ([place])  ? [facility] Glean Hess, MD  ? 11 months ago Annual physical exam  ? [facility] Glean Hess, MD  ? 1 year ago Primary osteoarthritis of both knees  ? [facility] Glean Hess, MD  ? 1 year ago Type II diabetes mellitus with complication [facility])  ? [facility] Glean [facility], MD  ? 1 year ago Annual physical exam  ? [facility] Glean Hess, MD  ? ?  ?  ?Future Appointments   ? ?        ? In 1 week Glean Hess, MD [facility], [place]  ? ?  ? ? ?   ?  ?  ?  ?

## 2022-01-17 ENCOUNTER — Other Ambulatory Visit: Payer: Self-pay | Admitting: Internal Medicine

## 2022-01-17 DIAGNOSIS — E1169 Type 2 diabetes mellitus with other specified complication: Secondary | ICD-10-CM

## 2022-01-19 NOTE — Telephone Encounter (Signed)
Requested Prescriptions  ?Pending Prescriptions Disp Refills  ?? TRULICITY 3 PI/9.5JO SOPN [Pharmacy Med Name: TRULICITY 3 AC/1.6 ML PEN] 2 mL 8  ?  Sig: INJECT 0.5 MLS (3 MG TOTAL) AS DIRECTED ONCE A WEEK.  ?  ? Endocrinology:  Diabetes - GLP-1 Receptor Agonists Passed - 01/17/2022  9:39 AM  ?  ?  Passed - HBA1C is between 0 and 7.9 and within 180 days  ?  Hemoglobin A1C  ?Date Value Ref Range Status  ?08/28/2021 6.3 (A) 4.0 - 5.6 % Final  ?05/31/2017 7.8  Final  ? ?Hgb A1c MFr Bld  ?Date Value Ref Range Status  ?01/21/2021 7.9 (H) 4.8 - 5.6 % Final  ?  Comment:  ?           Prediabetes: 5.7 - 6.4 ?         Diabetes: >6.4 ?         Glycemic control for adults with diabetes: <7.0 ?  ?   ?  ?  Passed - Valid encounter within last 6 months  ?  Recent Outpatient Visits   ?      ? 4 months ago Type II diabetes mellitus with complication ([place])  ? [facility] Glean Hess, MD  ? 12 months ago Annual physical exam  ? [facility] Glean Hess, MD  ? 1 year ago Primary osteoarthritis of both knees  ? [facility] Glean Hess, MD  ? 1 year ago Type II diabetes mellitus with complication [facility])  ? [facility] Glean Hess, MD  ? 2 years ago Annual physical exam  ? [facility] Glean Hess, MD  ?  ?  ?Future Appointments   ?        ? In 4 days Glean Hess, MD [facility], [place]  ?  ? ?  ?  ?  ? ?

## 2022-01-23 ENCOUNTER — Encounter: Payer: Self-pay | Admitting: Internal Medicine

## 2022-01-23 ENCOUNTER — Ambulatory Visit (INDEPENDENT_AMBULATORY_CARE_PROVIDER_SITE_OTHER): Payer: Managed Care, Other (non HMO) | Admitting: Internal Medicine

## 2022-01-23 VITALS — BP 104/70 | HR 103 | Ht 65.0 in | Wt 279.0 lb

## 2022-01-23 DIAGNOSIS — E1169 Type 2 diabetes mellitus with other specified complication: Secondary | ICD-10-CM | POA: Diagnosis not present

## 2022-01-23 DIAGNOSIS — Z0000 Encounter for general adult medical examination without abnormal findings: Secondary | ICD-10-CM

## 2022-01-23 DIAGNOSIS — Z6841 Body Mass Index (BMI) 40.0 and over, adult: Secondary | ICD-10-CM

## 2022-01-23 DIAGNOSIS — M159 Polyosteoarthritis, unspecified: Secondary | ICD-10-CM | POA: Diagnosis not present

## 2022-01-23 DIAGNOSIS — E785 Hyperlipidemia, unspecified: Secondary | ICD-10-CM

## 2022-01-23 DIAGNOSIS — G2581 Restless legs syndrome: Secondary | ICD-10-CM | POA: Diagnosis not present

## 2022-01-23 DIAGNOSIS — E118 Type 2 diabetes mellitus with unspecified complications: Secondary | ICD-10-CM

## 2022-01-23 DIAGNOSIS — I10 Essential (primary) hypertension: Secondary | ICD-10-CM

## 2022-01-23 LAB — POCT URINALYSIS DIPSTICK
Bilirubin, UA: NEGATIVE
Blood, UA: NEGATIVE
Glucose, UA: NEGATIVE
Ketones, UA: NEGATIVE
Leukocytes, UA: NEGATIVE
Nitrite, UA: NEGATIVE
Protein, UA: NEGATIVE
Spec Grav, UA: 1.02 (ref 1.010–1.025)
Urobilinogen, UA: 0.2 E.U./dL
pH, UA: 6 (ref 5.0–8.0)

## 2022-01-23 MED ORDER — HYDROCHLOROTHIAZIDE 25 MG PO TABS: 25.0000 mg | ORAL_TABLET | Freq: Every day | ORAL | 3 refills | Status: DC

## 2022-01-23 MED ORDER — METFORMIN HCL ER 500 MG PO TB24: 1000.0000 mg | ORAL_TABLET | Freq: Two times a day (BID) | ORAL | 3 refills | Status: DC

## 2022-01-23 MED ORDER — GLIPIZIDE ER 2.5 MG PO TB24: 2.5000 mg | ORAL_TABLET | Freq: Every day | ORAL | 3 refills | Status: DC

## 2022-01-23 MED ORDER — ATORVASTATIN CALCIUM 40 MG PO TABS: 40.0000 mg | ORAL_TABLET | Freq: Every day | ORAL | 3 refills | Status: DC

## 2022-01-23 MED ORDER — TRULICITY 4.5 MG/0.5ML ~~LOC~~ SOAJ: 4.5000 mg | SUBCUTANEOUS | 3 refills | Status: DC

## 2022-01-23 MED ORDER — ROPINIROLE HCL 0.25 MG PO TABS: ORAL_TABLET | ORAL | 3 refills | Status: DC

## 2022-01-23 MED ORDER — AMLODIPINE BESY-BENAZEPRIL HCL 5-20 MG PO CAPS: 1.0000 | ORAL_CAPSULE | Freq: Every day | ORAL | 3 refills | Status: DC

## 2022-01-23 NOTE — Progress Notes (Signed)
? ? ?Date:  01/23/2022  ? ?Name:  Joy Roberts   DOB:  03-Oct-1960   MRN:  [id number] ? ? ?Chief Complaint: Annual Exam ?Joy Roberts is a 61 y.o. female who presents today for her Complete Annual Exam. She feels well. She reports exercising. She reports she is sleeping poorly. Breast complaints - none. ? ?Mammogram: none ?DEXA: none ?Pap smear: 01/2011 ?Colonoscopy: none ? ?Health Maintenance Due  ?Topic Date Due  ? COLONOSCOPY (Pts 45-85yr Insurance coverage will need to be confirmed)  Never done  ? OPHTHALMOLOGY EXAM  06/19/2021  ?  ?Immunization History  ?Administered Date(s) Administered  ? Influenza,inj,Quad PF,6+ Mos 09/28/2017, 05/31/2018, 06/12/2019, 07/10/2020, 08/28/2021  ? PFIZER Comirnaty(Gray Top)Covid-19 Tri-Sucrose Vaccine 09/19/2020  ? PFIZER(Purple Top)SARS-COV-2 Vaccination 12/09/2019, 01/02/2020  ? Pneumococcal Polysaccharide-23 03/01/2013  ? Tdap 05/10/2012  ? ? ?Hypertension ?This is a chronic problem. The problem is controlled. Pertinent negatives include no chest pain, headaches, palpitations or shortness of breath. Past treatments include ACE inhibitors and calcium channel blockers. The current treatment provides significant improvement. There is no history of kidney disease, CAD/MI or CVA.  ?Diabetes ?She presents for her follow-up diabetic visit. She has type 2 diabetes mellitus. Her disease course has been stable. Pertinent negatives for hypoglycemia include no dizziness, headaches, nervousness/anxiousness or tremors. Pertinent negatives for diabetes include no chest pain, no fatigue, no polydipsia and no polyuria. Pertinent negatives for diabetic complications include no CVA. Current diabetic treatment includes oral agent (monotherapy) (metformin and Trulicity). An ACE inhibitor/angiotensin II receptor blocker is being taken. Eye exam is not current.  ?Hyperlipidemia ?This is a chronic problem. The problem is controlled. Pertinent negatives include no chest pain or shortness of  breath. Current antihyperlipidemic treatment includes statins. The current treatment provides significant improvement of lipids.  ? ?Lab Results  ?Component Value Date  ? NA 138 01/21/2021  ? K 4.2 01/21/2021  ? CO2 24 01/21/2021  ? GLUCOSE 147 (H) 01/21/2021  ? BUN 11 01/21/2021  ? CREATININE 1.11 (H) 01/21/2021  ? CALCIUM 9.5 01/21/2021  ? EGFR 58 (L) 01/21/2021  ? GFRNONAA 53 (L) 07/10/2020  ? ?Lab Results  ?Component Value Date  ? CHOL 132 01/21/2021  ? HDL 38 (L) 01/21/2021  ? L[place]71 01/21/2021  ? TRIG 126 01/21/2021  ? CHOLHDL 3.5 01/21/2021  ? ?Lab Results  ?Component Value Date  ? TSH 3.170 01/21/2021  ? ?Lab Results  ?Component Value Date  ? HGBA1C 6.3 (A) 08/28/2021  ? ?Lab Results  ?Component Value Date  ? WBC 8.4 01/21/2021  ? HGB 13.6 01/21/2021  ? HCT 42.1 01/21/2021  ? MCV 87 01/21/2021  ? PLT 392 01/21/2021  ? ?Lab Results  ?Component Value Date  ? ALT 40 (H) 01/21/2021  ? AST 34 01/21/2021  ? ALKPHOS 95 01/21/2021  ? BILITOT 0.4 01/21/2021  ? ?No results found for: 25OHVITD2, 2[place] VD25OH  ? ?Review of Systems  ?Constitutional:  Negative for chills, fatigue and fever.  ?HENT:  Negative for congestion, hearing loss, tinnitus, trouble swallowing and voice change.   ?Eyes:  Negative for visual disturbance.  ?Respiratory:  Negative for cough, chest tightness, shortness of breath and wheezing.   ?Cardiovascular:  Negative for chest pain, palpitations and leg swelling.  ?Gastrointestinal:  Negative for abdominal pain, constipation, diarrhea and vomiting.  ?Endocrine: Negative for polydipsia and polyuria.  ?Genitourinary:  Negative for dysuria, frequency, genital sores, vaginal bleeding and vaginal discharge.  ?Musculoskeletal:  Positive for arthralgias and gait problem. Negative for joint  swelling.  ?Skin:  Negative for color change and rash.  ?Neurological:  Negative for dizziness, tremors, light-headedness and headaches.  ?Hematological:  Negative for adenopathy. Does not bruise/bleed easily.   ?Psychiatric/Behavioral:  Negative for dysphoric mood and sleep disturbance. The patient is not nervous/anxious.   ? ?Patient Active Problem List  ? Diagnosis Date Noted  ? Lipoma of left upper extremity 07/10/2020  ? Pseudophakia of left eye 05/21/2020  ? Pseudophakia of right eye 05/14/2020  ? 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 Mild intermittent asthma without complication 71/02/2693  ? Calculus of kidney 09/26/2017  ? BMI 45.0-49.9, adult ([place]) 09/26/2017  ? Osteoarthritis of multiple joints 03/31/2016  ? Type II diabetes mellitus with complication ([place]) 85/46/2703  ? Essential hypertension 09/10/2014  ? Varicose veins of both lower extremities 03/27/2014  ? Umbilical hernia 50/05/3817  ? Hyperlipidemia associated with type 2 diabetes mellitus ([place]) 05/10/2012  ? ? ?Allergies  ?Allergen Reactions  ? Codeine Nausea And Vomiting  ? Sulfa Antibiotics Nausea And Vomiting  ? ? ?Past Surgical History:  ?Procedure Laterality Date  ? CESAREAN SECTION  2003  ? LITHOTRIPSY  01/11/2014  ? ? ?Social History  ? ?Tobacco Use  ? Smoking status: Never  ? Smokeless tobacco: Never  ?Vaping Use  ? Vaping Use: Never used  ?Substance Use Topics  ? Alcohol use: No  ?  Alcohol/week: 0.0 standard drinks  ? Drug use: No  ? ? ? ?Medication list has been reviewed and updated. ? ?Current Meds  ?Medication Sig  ? albuterol (ACCUNEB) 1.25 MG/3ML nebulizer solution Take 3 mLs (1.25 mg total) by nebulization every 6 (six) hours as needed for wheezing.  ? albuterol (VENTOLIN HFA) 108 (90 Base) MCG/ACT inhaler Ventolin HFA 90 mcg/actuation aerosol inhaler  ? amLODipine-benazepril (LOTREL) 5-20 MG capsule Take 1 capsule by mouth daily.  ? aspirin EC 81 MG tablet Take by mouth.  ? atorvastatin (LIPITOR) 40 MG tablet Take 1 tablet (40 mg total) by mouth daily.  ? Dulaglutide (TRULICITY) 4.5 EX/9.3ZJ SOPN Inject 4.5 mg as directed once a week.  ? fluticasone-salmeterol (ADVAIR DISKUS) 250-50 MCG/ACT AEPB Inhale 1 puff into the lungs in the morning and  at bedtime.  ? glipiZIDE (GLUCOTROL XL) 2.5 MG 24 hr tablet Take 1 tablet (2.5 mg total) by mouth daily.  ? hydrochlorothiazide (HYDRODIURIL) 25 MG tablet Take 1 tablet (25 mg total) by mouth daily.  ? metFORMIN (GLUCOPHAGE-XR) 500 MG 24 hr tablet Take 2 tablets (1,000 mg total) by mouth 2 (two) times daily.  ? rOPINIRole (REQUIP) 0.25 MG tablet TAKE 1 TABLET BY MOUTH AT BEDTIME  ? Spacer/Aero-Holding Chambers (AEROCHAMBER MV) inhaler Use as instructed  ? ? ? ?  01/23/2022  ? 10:52 AM 08/28/2021  ? 10:43 AM 01/21/2021  ?  9:34 AM 11/13/2020  ?  8:30 AM  ?GAD 7 : Generalized Anxiety Score  ?Nervous, Anxious, on Edge 0 0 0 0  ?Control/stop worrying 0 0 0 0  ?Worry too much - different things 0 0 0 0  ?Trouble relaxing 0 1 0 0  ?Restless 0 0 0 0  ?Easily annoyed or irritable 0 0 0 0  ?Afraid - awful might happen 0 0 0 0  ?Total GAD 7 Score 0 1 0 0  ?Anxiety Difficulty Not difficult at all Not difficult at all Not difficult at all   ? ? ? ?  01/23/2022  ? 10:52 AM  ?Depression screen PHQ 2/9  ?Decreased Interest 2  ?Down, Depressed, Hopeless 1  ?  PHQ - 2 Score 3  ?Altered sleeping 2  ?Tired, decreased energy 2  ?Change in appetite 0  ?Feeling bad or failure about yourself  0  ?Trouble concentrating 0  ?Moving slowly or fidgety/restless 0  ?Suicidal thoughts 0  ?PHQ-9 Score 7  ?Difficult doing work/chores Not difficult at all  ? ? ?BP Readings from Last 3 Encounters:  ?01/23/22 104/70  ?08/28/21 138/74  ?01/21/21 118/74  ? ? ?Physical Exam ?Vitals and nursing note reviewed.  ?Constitutional:   ?   General: She is not in acute distress. ?   Appearance: She is well-developed.  ?HENT:  ?   Head: Normocephalic and atraumatic.  ?   Right Ear: Tympanic membrane and ear canal normal.  ?   Left Ear: Tympanic membrane and ear canal normal.  ?   Nose:  ?   Right Sinus: No maxillary sinus tenderness.  ?   Left Sinus: No maxillary sinus tenderness.  ?Eyes:  ?   General: No scleral icterus.    ?   Right eye: No discharge.     ?   Left eye:  No discharge.  ?   Conjunctiva/sclera: Conjunctivae normal.  ?Neck:  ?   Thyroid: No thyromegaly.  ?   Vascular: No carotid bruit.  ?Cardiovascular:  ?   Rate and Rhythm: Normal rate and regular rhythm

## 2022-01-24 LAB — COMPREHENSIVE METABOLIC PANEL
ALT: 19 IU/L (ref 0–32)
AST: 15 IU/L (ref 0–40)
Albumin/Globulin Ratio: 1.3 (ref 1.2–2.2)
Albumin: 3.9 g/dL (ref 3.8–4.9)
Alkaline Phosphatase: 101 IU/L (ref 44–121)
BUN/Creatinine Ratio: 13 (ref 9–23)
BUN: 14 mg/dL (ref 6–24)
Bilirubin Total: <0.2 mg/dL (ref 0.0–1.2)
CO2: 26 mmol/L (ref 20–29)
Calcium: 9.8 mg/dL (ref 8.7–10.2)
Chloride: 100 mmol/L (ref 96–106)
Creatinine, Ser: 1.08 mg/dL — ABNORMAL HIGH (ref 0.57–1.00)
Globulin, Total: 3 g/dL (ref 1.5–4.5)
Glucose: 157 mg/dL — ABNORMAL HIGH (ref 70–99)
Potassium: 4.1 mmol/L (ref 3.5–5.2)
Sodium: 139 mmol/L (ref 134–144)
Total Protein: 6.9 g/dL (ref 6.0–8.5)
eGFR: 59 mL/min/{1.73_m2} — ABNORMAL LOW (ref >59)

## 2022-01-24 LAB — CBC WITH DIFFERENTIAL/PLATELET
Basophils Absolute: 0 10*3/uL (ref 0.0–0.2)
Basos: 1 %
EOS (ABSOLUTE): 0.5 10*3/uL — ABNORMAL HIGH (ref 0.0–0.4)
Eos: 7 %
Hematocrit: 40.3 % (ref 34.0–46.6)
Hemoglobin: 13.3 g/dL (ref 11.1–15.9)
Immature Grans (Abs): 0 10*3/uL (ref 0.0–0.1)
Immature Granulocytes: 1 %
Lymphocytes Absolute: 2.3 10*3/uL (ref 0.7–3.1)
Lymphs: 28 %
MCH: 28.2 pg (ref 26.6–33.0)
MCHC: 33 g/dL (ref 31.5–35.7)
MCV: 86 fL (ref 79–97)
Monocytes Absolute: 0.6 10*3/uL (ref 0.1–0.9)
Monocytes: 8 %
Neutrophils Absolute: 4.5 10*3/uL (ref 1.4–7.0)
Neutrophils: 55 %
Platelets: 370 10*3/uL (ref 150–450)
RBC: 4.71 x10E6/uL (ref 3.77–5.28)
RDW: 13.2 % (ref 11.7–15.4)
WBC: 8 10*3/uL (ref 3.4–10.8)

## 2022-01-24 LAB — LIPID PANEL
Chol/HDL Ratio: 3.4 ratio (ref 0.0–4.4)
Cholesterol, Total: 134 mg/dL (ref 100–199)
HDL: 40 mg/dL (ref >39)
LDL Chol Calc (NIH): 71 mg/dL (ref 0–99)
Triglycerides: 131 mg/dL (ref 0–149)
VLDL Cholesterol Cal: 23 mg/dL (ref 5–40)

## 2022-01-24 LAB — HEMOGLOBIN A1C
Est. average glucose Bld gHb Est-mCnc: 140 mg/dL
Hgb A1c MFr Bld: 6.5 % — ABNORMAL HIGH (ref 4.8–5.6)

## 2022-01-24 LAB — TSH: TSH: 2.32 u[IU]/mL (ref 0.450–4.500)

## 2022-01-25 LAB — MICROALBUMIN / CREATININE URINE RATIO
Creatinine, Urine: 101 mg/dL
Microalb/Creat Ratio: 30 mg/g creat — ABNORMAL HIGH (ref 0–29)
Microalbumin, Urine: 30.8 ug/mL

## 2022-04-15 ENCOUNTER — Other Ambulatory Visit: Payer: Self-pay | Admitting: Internal Medicine

## 2022-04-15 DIAGNOSIS — I10 Essential (primary) hypertension: Secondary | ICD-10-CM

## 2022-04-16 ENCOUNTER — Encounter: Payer: Self-pay | Admitting: Internal Medicine

## 2022-04-16 NOTE — Telephone Encounter (Signed)
Sent via Interface, early request. LRF 01/23/22  #90  3 refills Requested Prescriptions  Pending Prescriptions Disp Refills  . hydrochlorothiazide (HYDRODIURIL) 25 MG tablet [Pharmacy Med Name: HYDROCHLOROTHIAZIDE 25 MG TAB] 90 tablet 3    Sig: TAKE 1 TABLET (25 MG TOTAL) BY MOUTH DAILY.     Cardiovascular: Diuretics - Thiazide Failed - 04/15/2022  8:31 AM      Failed - Cr in normal range and within 180 days    Creatinine  Date Value Ref Range Status  01/13/2014 1.19 0.60 - 1.30 mg/dL Final   Creatinine, Ser  Date Value Ref Range Status  01/23/2022 1.08 (H) 0.57 - 1.00 mg/dL Final         Passed - K in normal range and within 180 days    Potassium  Date Value Ref Range Status  01/23/2022 4.1 3.5 - 5.2 mmol/L Final  01/13/2014 3.4 (L) 3.5 - 5.1 mmol/L Final         Passed - Na in normal range and within 180 days    Sodium  Date Value Ref Range Status  01/23/2022 139 134 - 144 mmol/L Final  01/13/2014 135 (L) 136 - 145 mmol/L Final         Passed - Last BP in normal range    BP Readings from Last 1 Encounters:  01/23/22 104/70         Passed - Valid encounter within last 6 months    Recent Outpatient Visits          2 months ago Annual physical exam   [facility] Glean Hess, MD   7 months ago Type II diabetes mellitus with complication [facility])   [facility] Glean Hess, MD   1 year ago Annual physical exam   [facility] Glean Hess, MD   1 year ago Primary osteoarthritis of both knees   [facility] Glean Hess, MD   1 year ago Type II diabetes mellitus with complication [facility])   [facility] Glean Hess, MD      Future Appointments            In 1 month Army Melia Jesse Sans, MD [facility], [place]   In 9 months Army Melia, Jesse Sans, MD [facility], [facility]

## 2022-05-28 ENCOUNTER — Encounter: Payer: Self-pay | Admitting: Internal Medicine

## 2022-05-28 ENCOUNTER — Ambulatory Visit: Payer: Managed Care, Other (non HMO) | Admitting: Internal Medicine

## 2022-05-28 VITALS — BP 126/76 | HR 95 | Ht 65.0 in | Wt 278.0 lb

## 2022-05-28 DIAGNOSIS — E118 Type 2 diabetes mellitus with unspecified complications: Secondary | ICD-10-CM | POA: Diagnosis not present

## 2022-05-28 DIAGNOSIS — I10 Essential (primary) hypertension: Secondary | ICD-10-CM

## 2022-05-28 DIAGNOSIS — Z23 Encounter for immunization: Secondary | ICD-10-CM

## 2022-05-28 DIAGNOSIS — E785 Hyperlipidemia, unspecified: Secondary | ICD-10-CM

## 2022-05-28 DIAGNOSIS — E1169 Type 2 diabetes mellitus with other specified complication: Secondary | ICD-10-CM

## 2022-05-28 LAB — POCT GLYCOSYLATED HEMOGLOBIN (HGB A1C): Hemoglobin A1C: 6.3 % — AB (ref 4.0–5.6)

## 2022-05-28 MED ORDER — TIRZEPATIDE 2.5 MG/0.5ML ~~LOC~~ SOAJ: 2.5000 mg | SUBCUTANEOUS | 0 refills | Status: DC

## 2022-05-28 NOTE — Progress Notes (Signed)
Date:  05/28/2022   Name:  Joy Roberts   DOB:  1960/09/24   MRN:  [id number]   Chief Complaint: Diabetes  Hypertension This is a chronic problem. The problem is controlled. Pertinent negatives include no chest pain, headaches, palpitations or shortness of breath. Past treatments include calcium channel blockers, ACE inhibitors and diuretics.  Diabetes She presents for her follow-up diabetic visit. She has type 2 diabetes mellitus. Her disease course has been stable. Pertinent negatives for hypoglycemia include no headaches or tremors. Pertinent negatives for diabetes include no chest pain, no fatigue, no polydipsia and no polyuria. (Mild drops in the evening at times ) Symptoms are stable. Current diabetic treatment includes oral agent (dual therapy) (metformin, glipizide and Trulicity). Her weight is stable. An ACE inhibitor/angiotensin II receptor blocker is being taken. Eye exam is not current.  She is struggling to lose weight so that she can have TKA.  She has very limited exercise capability.  She is on highest dose of Trulicity.  Lab Results  Component Value Date   NA 139 01/23/2022   K 4.1 01/23/2022   CO2 26 01/23/2022   GLUCOSE 157 (H) 01/23/2022   BUN 14 01/23/2022   CREATININE 1.08 (H) 01/23/2022   CALCIUM 9.8 01/23/2022   EGFR 59 (L) 01/23/2022   GFRNONAA 53 (L) 07/10/2020   Lab Results  Component Value Date   CHOL 134 01/23/2022   HDL 40 01/23/2022   LDLCALC 71 01/23/2022   TRIG 131 01/23/2022   CHOLHDL 3.4 01/23/2022   Lab Results  Component Value Date   TSH 2.320 01/23/2022   Lab Results  Component Value Date   HGBA1C 6.3 (A) 05/28/2022   Lab Results  Component Value Date   WBC 8.0 01/23/2022   HGB 13.3 01/23/2022   HCT 40.3 01/23/2022   MCV 86 01/23/2022   PLT 370 01/23/2022   Lab Results  Component Value Date   ALT 19 01/23/2022   AST 15 01/23/2022   ALKPHOS 101 01/23/2022   BILITOT <0.2 01/23/2022   No results found for:  "25OHVITD2", "25OHVITD3", "VD25OH"   Review of Systems  Constitutional:  Negative for appetite change, fatigue, fever and unexpected weight change.  HENT:  Negative for tinnitus and trouble swallowing.   Eyes:  Negative for visual disturbance.  Respiratory:  Negative for cough, chest tightness and shortness of breath.   Cardiovascular:  Negative for chest pain, palpitations and leg swelling.  Gastrointestinal:  Negative for abdominal pain.  Endocrine: Negative for polydipsia and polyuria.  Genitourinary:  Negative for dysuria and hematuria.  Musculoskeletal:  Negative for arthralgias.  Neurological:  Negative for tremors, numbness and headaches.  Psychiatric/Behavioral:  Negative for dysphoric mood.     Patient Active Problem List   Diagnosis Date Noted   Lipoma of left upper extremity 07/10/2020   Pseudophakia of left eye 05/21/2020   Pseudophakia of right eye 62/69/4854   Oral lichen planus 62/70/3500   Mild intermittent asthma without complication 93/81/8299   Calculus of kidney 09/26/2017   BMI 45.0-49.9, adult ([place]) 09/26/2017   Osteoarthritis of multiple joints 03/31/2016   Type II diabetes mellitus with complication ([place]) 37/16/9678   Essential hypertension 09/10/2014   Varicose veins of both lower extremities 93/81/0175   Umbilical hernia 07/08/8526   Hyperlipidemia associated with type 2 diabetes mellitus ([place]) 05/10/2012    Allergies  Allergen Reactions   Codeine Nausea And Vomiting   Sulfa Antibiotics Nausea And Vomiting    Past Surgical History:  Procedure Laterality Date   CESAREAN SECTION  2003   LITHOTRIPSY  01/11/2014    Social History   Tobacco Use   Smoking status: Never   Smokeless tobacco: Never  Vaping Use   Vaping Use: Never used  Substance Use Topics   Alcohol use: No    Alcohol/week: 0.0 standard drinks of alcohol   Drug use: No     Medication list has been reviewed and updated.  Current Meds  Medication Sig   albuterol (ACCUNEB)  1.25 MG/3ML nebulizer solution Take 3 mLs (1.25 mg total) by nebulization every 6 (six) hours as needed for wheezing.   amLODipine-benazepril (LOTREL) 5-20 MG capsule Take 1 capsule by mouth daily.   aspirin EC 81 MG tablet Take by mouth.   atorvastatin (LIPITOR) 40 MG tablet Take 1 tablet (40 mg total) by mouth daily.   Dulaglutide (TRULICITY) 4.5 KP/2.2ES SOPN Inject 4.5 mg as directed once a week.   fluticasone-salmeterol (ADVAIR DISKUS) 250-50 MCG/ACT AEPB Inhale 1 puff into the lungs in the morning and at bedtime.   glipiZIDE (GLUCOTROL XL) 2.5 MG 24 hr tablet Take 1 tablet (2.5 mg total) by mouth daily.   hydrochlorothiazide (HYDRODIURIL) 25 MG tablet Take 1 tablet (25 mg total) by mouth daily.   IBUPROFEN PO Take by mouth as needed.   metFORMIN (GLUCOPHAGE-XR) 500 MG 24 hr tablet Take 2 tablets (1,000 mg total) by mouth 2 (two) times daily.   rOPINIRole (REQUIP) 0.25 MG tablet TAKE 1 TABLET BY MOUTH AT BEDTIME   Spacer/Aero-Holding Chambers (AEROCHAMBER MV) inhaler Use as instructed       05/28/2022    9:39 AM 01/23/2022   10:52 AM 08/28/2021   10:43 AM 01/21/2021    9:34 AM  GAD 7 : Generalized Anxiety Score  Nervous, Anxious, on Edge 0 0 0 0  Control/stop worrying 0 0 0 0  Worry too much - different things 0 0 0 0  Trouble relaxing 0 0 1 0  Restless 0 0 0 0  Easily annoyed or irritable 0 0 0 0  Afraid - awful might happen 0 0 0 0  Total GAD 7 Score 0 0 1 0  Anxiety Difficulty Not difficult at all Not difficult at all Not difficult at all Not difficult at all       05/28/2022    9:38 AM 01/23/2022   10:52 AM 08/28/2021   10:42 AM  Depression screen PHQ 2/9  Decreased Interest '1 2 1  ' Down, Depressed, Hopeless 0 1 0  PHQ - 2 Score '1 3 1  ' Altered sleeping '1 2 2  ' Tired, decreased energy '1 2 1  ' Change in appetite 0 0 0  Feeling bad or failure about yourself  0 0 0  Trouble concentrating 0 0 0  Moving slowly or fidgety/restless 1 0 0  Suicidal thoughts 0 0 0  PHQ-9 Score  '4 7 4  ' Difficult doing work/chores Somewhat difficult Not difficult at all Somewhat difficult    BP Readings from Last 3 Encounters:  05/28/22 126/76  01/23/22 104/70  08/28/21 138/74    Physical Exam Vitals and nursing note reviewed.  Constitutional:      General: She is not in acute distress.    Appearance: She is well-developed.  HENT:     Head: Normocephalic and atraumatic.  Neck:     Vascular: No carotid bruit.  Cardiovascular:     Rate and Rhythm: Normal rate and regular rhythm.     Pulses: Normal pulses.  Pulmonary:     Effort: Pulmonary effort is normal. No respiratory distress.     Breath sounds: No wheezing or rhonchi.  Musculoskeletal:     Cervical back: Normal range of motion.     Right knee: Bony tenderness present. No effusion. Decreased range of motion.     Left knee: Bony tenderness present. No effusion. Decreased range of motion.     Right lower leg: No edema.     Left lower leg: No edema.  Lymphadenopathy:     Cervical: No cervical adenopathy.  Skin:    General: Skin is warm and dry.     Findings: No rash.  Neurological:     Mental Status: She is alert and oriented to person, place, and time.  Psychiatric:        Mood and Affect: Mood normal.        Behavior: Behavior normal.     Wt Readings from Last 3 Encounters:  05/28/22 278 lb (126.1 kg)  01/23/22 279 lb (126.6 kg)  08/28/21 276 lb 3.2 oz (125.3 kg)    BP 126/76   Pulse 95   Ht '5\' 5"'  (1.651 m)   Wt 278 lb (126.1 kg)   SpO2 96%   BMI 46.26 kg/m   Assessment and Plan: 1. Type II diabetes mellitus with complication (HCC) Clinically stable by exam and report without s/s of hypoglycemia. DM complicated by hypertension and dyslipidemia. Tolerating medications well without side effects or other concerns. Will try to get [facility] authorized to see if she has more weight loss. Stop glimepiride. - POCT glycosylated hemoglobin (Hb A1C) = 6.3  2. Essential hypertension Clinically stable  exam with well controlled BP. Tolerating medications without side effects at this time. Pt to continue current regimen and low sodium diet; benefits of regular exercise as able discussed.  3. Hyperlipidemia associated with type 2 diabetes mellitus ([place]) On statin therapy   Partially dictated using Editor, commissioning. Any errors are unintentional.  Halina Maidens, MD [facility]  05/28/2022

## 2022-06-23 ENCOUNTER — Other Ambulatory Visit: Payer: Self-pay | Admitting: Internal Medicine

## 2022-06-23 DIAGNOSIS — E118 Type 2 diabetes mellitus with unspecified complications: Secondary | ICD-10-CM

## 2022-06-24 ENCOUNTER — Other Ambulatory Visit: Payer: Self-pay | Admitting: Internal Medicine

## 2022-06-24 DIAGNOSIS — E118 Type 2 diabetes mellitus with unspecified complications: Secondary | ICD-10-CM

## 2022-06-24 MED ORDER — TIRZEPATIDE 5 MG/0.5ML ~~LOC~~ SOAJ: 5.0000 mg | SUBCUTANEOUS | 0 refills | Status: DC

## 2022-06-24 NOTE — Telephone Encounter (Signed)
Requested medication (s) are due for refill today yes  Requested medication (s) are on the active medication list: yes  Last refill:  05/29/22  Future visit scheduled: yes  Notes to clinic:  Unable to refill per protocol, Medication not assigned to a protocol, review manually.     Requested Prescriptions  Pending Prescriptions Disp Refills   MOUNJARO 2.5 MG/0.5ML Pen [Pharmacy Med Name: MOUNJARO 2.5 MG/0.5 ML PEN]      Sig: INJECT 2.5 MG SUBCUTANEOUSLY WEEKLY     Off-Protocol Failed - 06/23/2022  4:35 PM      Failed - Medication not assigned to a protocol, review manually.      Passed - Valid encounter within last 12 months    Recent Outpatient Visits           3 weeks ago Type II diabetes mellitus with complication [facility])   [place] [facility] at [facility] Jesse Sans, MD   5 months ago Annual physical exam   [place] [facility] at [facility], Jesse Sans, MD   10 months ago Type II diabetes mellitus with complication [facility])   [place] [facility] Care and Sports Medicine at [facility], Jesse Sans, MD   1 year ago Annual physical exam   [facility] at [facility], Jesse Sans, MD   1 year ago Primary osteoarthritis of both knees   [place] [facility] Care and [facility] Medicine at [facility], Jesse Sans, MD       Future Appointments             In 3 months Army Melia, Jesse Sans, MD [facility] Primary [facility] and [facility] Medicine at [facility], [facility]   In 7 months Army Melia, Jesse Sans, MD [place] Primary Care and Sports Medicine at [facility], [facility]

## 2022-07-05 ENCOUNTER — Other Ambulatory Visit: Payer: Self-pay | Admitting: Internal Medicine

## 2022-07-05 DIAGNOSIS — E118 Type 2 diabetes mellitus with unspecified complications: Secondary | ICD-10-CM

## 2022-07-07 NOTE — Telephone Encounter (Signed)
Requested medication (s) are due for refill today:   Provider to review  Requested medication (s) are on the active medication list:   Yes   Future visit scheduled:   Yes   Last ordered: 06/24/2022 2 ml, 0 refills  Returned because no protocol assigned to this medication   Requested Prescriptions  Pending Prescriptions Disp Refills   MOUNJARO 5 MG/0.5ML Pen [Pharmacy Med Name: MOUNJARO 5 MG/0.5 ML PEN]      Sig: INJECT 5 MG SUBCUTANEOUSLY WEEKLY     Off-Protocol Failed - 07/05/2022 10:16 AM      Failed - Medication not assigned to a protocol, review manually.      Passed - Valid encounter within last 12 months    Recent Outpatient Visits           1 month ago Type II diabetes mellitus with complication [facility])   [place] [facility] at [facility], Jesse Sans, MD   5 months ago Annual physical exam   [place] [facility] and [facility] Medicine at [facility], Jesse Sans, MD   10 months ago Type II diabetes mellitus with complication [facility])   [place] [facility] and [facility] Medicine at [facility], Jesse Sans, MD   1 year ago Annual physical exam   [facility] Primary Care and [facility] Medicine at [facility], Jesse Sans, MD   1 year ago Primary osteoarthritis of both knees   [place] [facility] Care and Sports Medicine at [facility], Jesse Sans, MD       Future Appointments             In 2 months Army Melia, Jesse Sans, MD [facility] Primary Care and Sports Medicine at [facility], [facility]   In 7 months Army Melia, Jesse Sans, MD [place] [facility] and Sports Medicine at [facility], [facility]

## 2022-07-14 ENCOUNTER — Other Ambulatory Visit: Payer: Self-pay | Admitting: Internal Medicine

## 2022-07-14 DIAGNOSIS — E118 Type 2 diabetes mellitus with unspecified complications: Secondary | ICD-10-CM

## 2022-07-14 NOTE — Telephone Encounter (Signed)
Unable to refill per protocol, last refill by provider 06/24/22, request is too soon. Will refuse duplicate request.  Requested Prescriptions  Pending Prescriptions Disp Refills  . MOUNJARO 5 MG/0.5ML Pen [Pharmacy Med Name: MOUNJARO 5 MG/0.5 ML PEN]      Sig: INJECT 5 MG SUBCUTANEOUSLY WEEKLY     Off-Protocol Failed - 07/14/2022  9:37 AM      Failed - Medication not assigned to a protocol, review manually.      Passed - Valid encounter within last 12 months    Recent Outpatient Visits          1 month ago Type II diabetes mellitus with complication [facility])   [place] [facility] at [facility], Jesse Sans, MD   5 months ago Annual physical exam   [place] [facility] at [facility], Jesse Sans, MD   10 months ago Type II diabetes mellitus with complication [facility])   [place] [facility] and Sports Medicine at [facility], Jesse Sans, MD   1 year ago Annual physical exam   [facility] and [facility] at [facility], Jesse Sans, MD   1 year ago Primary osteoarthritis of both knees    Primary Care and Sports Medicine at [facility], Jesse Sans, MD      Future Appointments            In 2 months Army Melia, Jesse Sans, MD [facility] Primary Care and Sports Medicine at [facility], [facility]   In 7 months Army Melia, Jesse Sans, MD [place] Primary Care and Sports Medicine at [facility], [facility]

## 2022-09-04 ENCOUNTER — Other Ambulatory Visit: Payer: Self-pay | Admitting: Internal Medicine

## 2022-09-04 DIAGNOSIS — E118 Type 2 diabetes mellitus with unspecified complications: Secondary | ICD-10-CM

## 2022-09-29 ENCOUNTER — Ambulatory Visit: Payer: Managed Care, Other (non HMO) | Admitting: Internal Medicine

## 2022-09-29 NOTE — Assessment & Plan Note (Deleted)
Clinically stable without s/s of hypoglycemia. Tolerating medications well without side effects or other concerns - metformin Continue without changes at this time.

## 2022-09-29 NOTE — Progress Notes (Deleted)
Date:  09/29/2022   Name:  Joy Roberts   DOB:  13-Aug-1961   MRN:  [id number]   Chief Complaint: No chief complaint on file.  Diabetes She presents for her follow-up diabetic visit. She has type 2 diabetes mellitus. Pertinent negatives for hypoglycemia include no dizziness or headaches. Pertinent negatives for diabetes include no chest pain, no fatigue and no weakness. Pertinent negatives for diabetic complications include no CVA. Current diabetic treatment includes oral agent (monotherapy) (metformin). Her weight is stable.  Hypertension This is a chronic problem. The problem is controlled. Pertinent negatives include no chest pain, headaches, palpitations or shortness of breath. Past treatments include ACE inhibitors, calcium channel blockers and diuretics. The current treatment provides significant improvement. Hypertensive end-organ damage includes kidney disease. There is no history of CAD/MI or CVA.    Lab Results  Component Value Date   NA 139 01/23/2022   K 4.1 01/23/2022   CO2 26 01/23/2022   GLUCOSE 157 (H) 01/23/2022   BUN 14 01/23/2022   CREATININE 1.08 (H) 01/23/2022   CALCIUM 9.8 01/23/2022   EGFR 59 (L) 01/23/2022   GFRNONAA 53 (L) 07/10/2020   Lab Results  Component Value Date   CHOL 134 01/23/2022   HDL 40 01/23/2022   LDLCALC 71 01/23/2022   TRIG 131 01/23/2022   CHOLHDL 3.4 01/23/2022   Lab Results  Component Value Date   TSH 2.320 01/23/2022   Lab Results  Component Value Date   HGBA1C 6.3 (A) 05/28/2022   Lab Results  Component Value Date   WBC 8.0 01/23/2022   HGB 13.3 01/23/2022   HCT 40.3 01/23/2022   MCV 86 01/23/2022   PLT 370 01/23/2022   Lab Results  Component Value Date   ALT 19 01/23/2022   AST 15 01/23/2022   ALKPHOS 101 01/23/2022   BILITOT <0.2 01/23/2022   No results found for: "25OHVITD2", "25OHVITD3", "VD25OH"   Review of Systems  Constitutional:  Negative for fatigue and unexpected weight change.  HENT:  Negative  for nosebleeds.   Eyes:  Negative for visual disturbance.  Respiratory:  Negative for cough, chest tightness, shortness of breath and wheezing.   Cardiovascular:  Negative for chest pain, palpitations and leg swelling.  Gastrointestinal:  Negative for abdominal pain, constipation and diarrhea.  Neurological:  Negative for dizziness, weakness, light-headedness and headaches.    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   Mild intermittent asthma without complication 123456   Calculus of kidney 09/26/2017   BMI 45.0-49.9, adult ([place]) 09/26/2017   Osteoarthritis of multiple joints 03/31/2016   Type II diabetes mellitus with complication ([place]) 99991111   Essential hypertension 09/10/2014   Varicose veins of both lower extremities 123XX123   Umbilical hernia 0000000   Hyperlipidemia associated with type 2 diabetes mellitus ([place]) 05/10/2012    Allergies  Allergen Reactions   Codeine Nausea And Vomiting   Sulfa Antibiotics Nausea And Vomiting    Past Surgical History:  Procedure Laterality Date   CESAREAN SECTION  2003   LITHOTRIPSY  01/11/2014    Social History   Tobacco Use   Smoking status: Never   Smokeless tobacco: Never  Vaping Use   Vaping Use: Never used  Substance Use Topics   Alcohol use: No    Alcohol/week: 0.0 standard drinks of alcohol  Drug use: No     Medication list has been reviewed and updated.  No outpatient medications have been marked as taking for the 09/29/22 encounter (Appointment) with Glean Hess, MD.       05/28/2022    9:39 AM 01/23/2022   10:52 AM 08/28/2021   10:43 AM 01/21/2021    9:34 AM  GAD 7 : Generalized Anxiety Score  Nervous, Anxious, on Edge 0 0 0 0  Control/stop worrying 0 0 0 0  Worry too much - different things 0 0 0 0  Trouble relaxing 0 0 1 0   Restless 0 0 0 0  Easily annoyed or irritable 0 0 0 0  Afraid - awful might happen 0 0 0 0  Total GAD 7 Score 0 0 1 0  Anxiety Difficulty Not difficult at all Not difficult at all Not difficult at all Not difficult at all       05/28/2022    9:38 AM 01/23/2022   10:52 AM 08/28/2021   10:42 AM  Depression screen PHQ 2/9  Decreased Interest 1 2 1  $ Down, Depressed, Hopeless 0 1 0  PHQ - 2 Score 1 3 1  $ Altered sleeping 1 2 2  $ Tired, decreased energy 1 2 1  $ Change in appetite 0 0 0  Feeling bad or failure about yourself  0 0 0  Trouble concentrating 0 0 0  Moving slowly or fidgety/restless 1 0 0  Suicidal thoughts 0 0 0  PHQ-9 Score 4 7 4  $ Difficult doing work/chores Somewhat difficult Not difficult at all Somewhat difficult    BP Readings from Last 3 Encounters:  05/28/22 126/76  01/23/22 104/70  08/28/21 138/74    Physical Exam Vitals and nursing note reviewed.  Constitutional:      General: She is not in acute distress.    Appearance: She is well-developed.  HENT:     Head: Normocephalic and atraumatic.  Pulmonary:     Effort: Pulmonary effort is normal. No respiratory distress.  Skin:    General: Skin is warm and dry.     Findings: No rash.  Neurological:     Mental Status: She is alert and oriented to person, place, and time.  Psychiatric:        Mood and Affect: Mood normal.        Behavior: Behavior normal.     Wt Readings from Last 3 Encounters:  05/28/22 278 lb (126.1 kg)  01/23/22 279 lb (126.6 kg)  08/28/21 276 lb 3.2 oz (125.3 kg)    There were no vitals taken for this visit.  Assessment and Plan:

## 2022-09-29 NOTE — Assessment & Plan Note (Deleted)
Clinically stable exam with well controlled BP on benazepril, amlodipine . Tolerating medications without side effects. Pt to continue current regimen and low sodium diet.

## 2022-10-05 ENCOUNTER — Ambulatory Visit (INDEPENDENT_AMBULATORY_CARE_PROVIDER_SITE_OTHER): Payer: Managed Care, Other (non HMO) | Admitting: Internal Medicine

## 2022-10-05 ENCOUNTER — Encounter: Payer: Self-pay | Admitting: Internal Medicine

## 2022-10-05 VITALS — BP 124/76 | HR 91 | Ht 65.0 in | Wt 288.4 lb

## 2022-10-05 DIAGNOSIS — M159 Polyosteoarthritis, unspecified: Secondary | ICD-10-CM | POA: Diagnosis not present

## 2022-10-05 DIAGNOSIS — I10 Essential (primary) hypertension: Secondary | ICD-10-CM | POA: Diagnosis not present

## 2022-10-05 DIAGNOSIS — E118 Type 2 diabetes mellitus with unspecified complications: Secondary | ICD-10-CM | POA: Diagnosis not present

## 2022-10-05 DIAGNOSIS — J4520 Mild intermittent asthma, uncomplicated: Secondary | ICD-10-CM | POA: Diagnosis not present

## 2022-10-05 MED ORDER — ALBUTEROL SULFATE 1.25 MG/3ML IN NEBU: 1.0000 | INHALATION_SOLUTION | Freq: Four times a day (QID) | RESPIRATORY_TRACT | 5 refills | Status: DC | PRN

## 2022-10-05 MED ORDER — TRULICITY 4.5 MG/0.5ML ~~LOC~~ SOAJ: 4.5000 mg | SUBCUTANEOUS | 3 refills | Status: DC

## 2022-10-05 MED ORDER — FLUTICASONE-SALMETEROL 250-50 MCG/ACT IN AEPB: 1.0000 | INHALATION_SPRAY | Freq: Two times a day (BID) | RESPIRATORY_TRACT | 5 refills | Status: DC

## 2022-10-05 NOTE — Assessment & Plan Note (Signed)
Clinically stable exam with well controlled BP on Lotrel and hctz. Tolerating medications without side effects. Pt to continue current regimen and low sodium diet.

## 2022-10-05 NOTE — Progress Notes (Signed)
Date:  10/05/2022   Name:  Joy Roberts   DOB:  1960-11-08   MRN:  [id number]   Chief Complaint: Diabetes (Patient unable to get mounjaro fromt he pharmacy. Wants to discuss going back to Trulicity instead.) and Hypertension  Diabetes She presents for her follow-up diabetic visit. She has type 2 diabetes mellitus. Her disease course has been improving. Pertinent negatives for hypoglycemia include no headaches, nervousness/anxiousness or tremors. Pertinent negatives for diabetes include no chest pain, no fatigue, no polydipsia and no polyuria. Current diabetic treatments: metformin and mounjaro. She is compliant with treatment all of the time.  Hypertension This is a chronic problem. The problem is controlled. Pertinent negatives include no chest pain, headaches, palpitations or shortness of breath. Past treatments include diuretics, calcium channel blockers and ACE inhibitors.  Knee Pain  The pain is present in the left knee and right knee. The quality of the pain is described as shooting and aching. The pain is moderate. The pain has been Fluctuating since onset. Pertinent negatives include no numbness.  Asthma She complains of chest tightness and sputum production. There is no cough or shortness of breath. This is a recurrent problem. The problem occurs intermittently. Pertinent negatives include no appetite change, chest pain, fever, headaches or trouble swallowing. Her symptoms are aggravated by exposure to fumes, exposure to smoke and pollen. Her symptoms are alleviated by beta-agonist and steroid inhaler. She reports significant improvement on treatment. Her past medical history is significant for asthma.    Lab Results  Component Value Date   NA 139 01/23/2022   K 4.1 01/23/2022   CO2 26 01/23/2022   GLUCOSE 157 (H) 01/23/2022   BUN 14 01/23/2022   CREATININE 1.08 (H) 01/23/2022   CALCIUM 9.8 01/23/2022   EGFR 59 (L) 01/23/2022   GFRNONAA 53 (L) 07/10/2020   Lab Results   Component Value Date   CHOL 134 01/23/2022   HDL 40 01/23/2022   LDLCALC 71 01/23/2022   TRIG 131 01/23/2022   CHOLHDL 3.4 01/23/2022   Lab Results  Component Value Date   TSH 2.320 01/23/2022   Lab Results  Component Value Date   HGBA1C 6.3 (A) 05/28/2022   Lab Results  Component Value Date   WBC 8.0 01/23/2022   HGB 13.3 01/23/2022   HCT 40.3 01/23/2022   MCV 86 01/23/2022   PLT 370 01/23/2022   Lab Results  Component Value Date   ALT 19 01/23/2022   AST 15 01/23/2022   ALKPHOS 101 01/23/2022   BILITOT <0.2 01/23/2022   No results found for: "25OHVITD2", "25OHVITD3", "VD25OH"   Review of Systems  Constitutional:  Negative for appetite change, fatigue, fever and unexpected weight change.  HENT:  Negative for tinnitus and trouble swallowing.   Eyes:  Negative for visual disturbance.  Respiratory:  Positive for sputum production. Negative for cough, chest tightness and shortness of breath.   Cardiovascular:  Negative for chest pain, palpitations and leg swelling.  Gastrointestinal:  Negative for abdominal pain.  Endocrine: Negative for polydipsia and polyuria.  Genitourinary:  Negative for dysuria and hematuria.  Musculoskeletal:  Positive for arthralgias and gait problem.  Neurological:  Negative for tremors, numbness and headaches.  Psychiatric/Behavioral:  Positive for sleep disturbance. Negative for dysphoric mood. The patient is not nervous/anxious.     Patient Active Problem List   Diagnosis Date Noted   Lipoma of left upper extremity 07/10/2020   Pseudophakia of left eye 05/21/2020   Pseudophakia of right eye 05/14/2020  Oral lichen planus 41/28/7867   Mild intermittent asthma without complication 67/20/9470   Calculus of kidney 09/26/2017   BMI 45.0-49.9, adult ([place]) 09/26/2017   Osteoarthritis of multiple joints 03/31/2016   Type II diabetes mellitus with complication ([place]) 96/28/3662   Essential hypertension 09/10/2014   Varicose veins of both  lower extremities 94/76/5465   Umbilical hernia 03/54/6568   Hyperlipidemia associated with type 2 diabetes mellitus ([place]) 05/10/2012    Allergies  Allergen Reactions   Codeine Nausea And Vomiting   Sulfa Antibiotics Nausea And Vomiting    Past Surgical History:  Procedure Laterality Date   CESAREAN SECTION  2003   LITHOTRIPSY  01/11/2014    Social History   Tobacco Use   Smoking status: Never   Smokeless tobacco: Never  Vaping Use   Vaping Use: Never used  Substance Use Topics   Alcohol use: No    Alcohol/week: 0.0 standard drinks of alcohol   Drug use: No     Medication list has been reviewed and updated.  Current Meds  Medication Sig   albuterol (VENTOLIN HFA) 108 (90 Base) MCG/ACT inhaler    amLODipine-benazepril (LOTREL) 5-20 MG capsule Take 1 capsule by mouth daily.   aspirin EC 81 MG tablet Take by mouth.   atorvastatin (LIPITOR) 40 MG tablet Take 1 tablet (40 mg total) by mouth daily.   Dulaglutide (TRULICITY) 4.5 LE/7.5TZ SOPN Inject 4.5 mg as directed once a week.   glipiZIDE (GLUCOTROL XL) 2.5 MG 24 hr tablet Take 2.5 mg by mouth daily with breakfast.   hydrochlorothiazide (HYDRODIURIL) 25 MG tablet Take 1 tablet (25 mg total) by mouth daily.   IBUPROFEN PO Take by mouth as needed.   metFORMIN (GLUCOPHAGE-XR) 500 MG 24 hr tablet TAKE 2 TABLETS BY MOUTH TWICE A DAY   rOPINIRole (REQUIP) 0.25 MG tablet TAKE 1 TABLET BY MOUTH AT BEDTIME   Spacer/Aero-Holding Chambers (AEROCHAMBER MV) inhaler Use as instructed   [DISCONTINUED] albuterol (ACCUNEB) 1.25 MG/3ML nebulizer solution Take 3 mLs (1.25 mg total) by nebulization every 6 (six) hours as needed for wheezing.   [DISCONTINUED] fluticasone-salmeterol (ADVAIR DISKUS) 250-50 MCG/ACT AEPB Inhale 1 puff into the lungs in the morning and at bedtime.       10/05/2022    9:06 AM 05/28/2022    9:39 AM 01/23/2022   10:52 AM 08/28/2021   10:43 AM  GAD 7 : Generalized Anxiety Score  Nervous, Anxious, on Edge 0 0 0 0   Control/stop worrying 0 0 0 0  Worry too much - different things 0 0 0 0  Trouble relaxing 0 0 0 1  Restless 0 0 0 0  Easily annoyed or irritable 0 0 0 0  Afraid - awful might happen 0 0 0 0  Total GAD 7 Score 0 0 0 1  Anxiety Difficulty Not difficult at all Not difficult at all Not difficult at all Not difficult at all       10/05/2022    9:05 AM 05/28/2022    9:38 AM 01/23/2022   10:52 AM  Depression screen PHQ 2/9  Decreased Interest '2 1 2  '$ Down, Depressed, Hopeless 0 0 1  PHQ - 2 Score '2 1 3  '$ Altered sleeping '3 1 2  '$ Tired, decreased energy '2 1 2  '$ Change in appetite 0 0 0  Feeling bad or failure about yourself  0 0 0  Trouble concentrating 0 0 0  Moving slowly or fidgety/restless 3 1 0  Suicidal thoughts 0 0 0  PHQ-9 Score '10 4 7  '$ Difficult doing work/chores Somewhat difficult Somewhat difficult Not difficult at all    BP Readings from Last 3 Encounters:  10/05/22 124/76  05/28/22 126/76  01/23/22 104/70    Physical Exam Vitals and nursing note reviewed.  Constitutional:      General: She is not in acute distress.    Appearance: She is well-developed. She is obese.  HENT:     Head: Normocephalic and atraumatic.  Cardiovascular:     Rate and Rhythm: Normal rate and regular rhythm.  Pulmonary:     Effort: Pulmonary effort is normal. No respiratory distress.     Breath sounds: No wheezing or rhonchi.  Musculoskeletal:     Cervical back: Normal range of motion.     Right lower leg: No edema.     Left lower leg: No edema.  Lymphadenopathy:     Cervical: No cervical adenopathy.  Skin:    General: Skin is warm and dry.     Capillary Refill: Capillary refill takes less than 2 seconds.     Findings: No rash.  Neurological:     Mental Status: She is alert and oriented to person, place, and time.  Psychiatric:        Mood and Affect: Mood normal.        Behavior: Behavior normal.     Wt Readings from Last 3 Encounters:  10/05/22 288 lb 6.4 oz (130.8 kg)   05/28/22 278 lb (126.1 kg)  01/23/22 279 lb (126.6 kg)    BP 124/76 (BP Location: Left Arm, Cuff Size: Large)   Pulse 91   Ht '5\' 5"'$  (1.651 m)   Wt 288 lb 6.4 oz (130.8 kg)   SpO2 97%   BMI 47.99 kg/m   Assessment and Plan: Problem List Items Addressed This Visit       Cardiovascular and Mediastinum   Essential hypertension - Primary (Chronic)    Clinically stable exam with well controlled BP on Lotrel and hctz. Tolerating medications without side effects. Pt to continue current regimen and low sodium diet.         Respiratory   Mild intermittent asthma without complication (Chronic)    Asthma is well controlled - using Advair only as needed Nebulizer as needed for triggers      Relevant Medications   albuterol (ACCUNEB) 1.25 MG/3ML nebulizer solution   fluticasone-salmeterol (ADVAIR DISKUS) 250-50 MCG/ACT AEPB     Endocrine   Type II diabetes mellitus with complication (HCC) (Chronic)    Clinically stable without s/s of hypoglycemia on metformin but unable to get Mounjaro due to availability.   Would like to go back to Trulicity. Tolerating medications well without side effects or other concerns.        Relevant Medications   glipiZIDE (GLUCOTROL XL) 2.5 MG 24 hr tablet   Dulaglutide (TRULICITY) 4.5 JY/7.8GN SOPN   Other Relevant Orders   Basic metabolic panel   Hemoglobin A1c     Musculoskeletal and Integument   Osteoarthritis of multiple joints    Severe OA of knees - failed non surgical treatment Needs TKA but surgery wants her to lose 35 lbs before surgery Taking Advil daily so will need to watch renal function        Partially dictated using Dragon software. Any errors are unintentional.  Halina Maidens, MD [facility]  10/05/2022

## 2022-10-05 NOTE — Assessment & Plan Note (Signed)
Severe OA of knees - failed non surgical treatment Needs TKA but surgery wants her to lose 35 lbs before surgery Taking Advil daily so will need to watch renal function

## 2022-10-05 NOTE — Assessment & Plan Note (Addendum)
Clinically stable without s/s of hypoglycemia on metformin but unable to get [facility] due to availability.   Would like to go back to Trulicity. Tolerating medications well without side effects or other concerns.

## 2022-10-05 NOTE — Assessment & Plan Note (Signed)
Asthma is well controlled - using Advair only as needed Nebulizer as needed for triggers

## 2022-10-06 LAB — BASIC METABOLIC PANEL
BUN/Creatinine Ratio: 13 (ref 12–28)
BUN: 14 mg/dL (ref 8–27)
CO2: 25 mmol/L (ref 20–29)
Calcium: 9.8 mg/dL (ref 8.7–10.3)
Chloride: 100 mmol/L (ref 96–106)
Creatinine, Ser: 1.09 mg/dL — ABNORMAL HIGH (ref 0.57–1.00)
Glucose: 211 mg/dL — ABNORMAL HIGH (ref 70–99)
Potassium: 4.1 mmol/L (ref 3.5–5.2)
Sodium: 142 mmol/L (ref 134–144)
eGFR: 58 mL/min/{1.73_m2} — ABNORMAL LOW (ref >59)

## 2022-10-06 LAB — HEMOGLOBIN A1C
Est. average glucose Bld gHb Est-mCnc: 151 mg/dL
Hgb A1c MFr Bld: 6.9 % — ABNORMAL HIGH (ref 4.8–5.6)

## 2023-01-14 ENCOUNTER — Other Ambulatory Visit: Payer: Self-pay | Admitting: Internal Medicine

## 2023-01-14 DIAGNOSIS — I10 Essential (primary) hypertension: Secondary | ICD-10-CM

## 2023-02-09 ENCOUNTER — Encounter: Payer: Self-pay | Admitting: Internal Medicine

## 2023-02-09 ENCOUNTER — Ambulatory Visit (INDEPENDENT_AMBULATORY_CARE_PROVIDER_SITE_OTHER): Payer: Managed Care, Other (non HMO) | Admitting: Internal Medicine

## 2023-02-09 VITALS — BP 118/68 | HR 102 | Ht 65.0 in | Wt 286.0 lb

## 2023-02-09 DIAGNOSIS — L438 Other lichen planus: Secondary | ICD-10-CM

## 2023-02-09 DIAGNOSIS — Z1231 Encounter for screening mammogram for malignant neoplasm of breast: Secondary | ICD-10-CM | POA: Diagnosis not present

## 2023-02-09 DIAGNOSIS — E1169 Type 2 diabetes mellitus with other specified complication: Secondary | ICD-10-CM | POA: Diagnosis not present

## 2023-02-09 DIAGNOSIS — I10 Essential (primary) hypertension: Secondary | ICD-10-CM | POA: Diagnosis not present

## 2023-02-09 DIAGNOSIS — E119 Type 2 diabetes mellitus without complications: Secondary | ICD-10-CM | POA: Diagnosis not present

## 2023-02-09 DIAGNOSIS — Z1211 Encounter for screening for malignant neoplasm of colon: Secondary | ICD-10-CM

## 2023-02-09 DIAGNOSIS — Z0000 Encounter for general adult medical examination without abnormal findings: Secondary | ICD-10-CM | POA: Diagnosis not present

## 2023-02-09 DIAGNOSIS — E785 Hyperlipidemia, unspecified: Secondary | ICD-10-CM

## 2023-02-09 DIAGNOSIS — Z6841 Body Mass Index (BMI) 40.0 and over, adult: Secondary | ICD-10-CM

## 2023-02-09 DIAGNOSIS — Z7984 Long term (current) use of oral hypoglycemic drugs: Secondary | ICD-10-CM

## 2023-02-09 DIAGNOSIS — Z79899 Other long term (current) drug therapy: Secondary | ICD-10-CM

## 2023-02-09 MED ORDER — PEN NEEDLES 32G X 5 MM MISC
1.0000 | 0 refills | Status: AC
Start: 2023-02-09 — End: ?

## 2023-02-09 MED ORDER — OZEMPIC (0.25 OR 0.5 MG/DOSE) 2 MG/3ML ~~LOC~~ SOPN
PEN_INJECTOR | SUBCUTANEOUS | 0 refills | Status: DC
Start: 2023-02-09 — End: 2023-03-17

## 2023-02-09 NOTE — Assessment & Plan Note (Addendum)
Blood sugars stable without hypoglycemic symptoms or events. Currently being treated with metformin, glucotrol but has been unable to get Trulicity. Lab Results  Component Value Date   HGBA1C 6.9 (H) 10/05/2022  Will prescribe Ozempic

## 2023-02-09 NOTE — Patient Instructions (Signed)
Take Ozempic 0.25 mg weekly for 4 weeks then increase to 0.5 mg weekly.  Call for the 1 mg dose when you get to the last 2 doses.  Use a new pen needle each time.

## 2023-02-09 NOTE — Assessment & Plan Note (Signed)
Seen by specialist in [facility] Treated topically and them biopsied

## 2023-02-09 NOTE — Assessment & Plan Note (Signed)
Tolerating statin medications.  No side effects noted. LDL is  Lab Results  Component Value Date   LDLCALC 71 01/23/2022

## 2023-02-09 NOTE — Assessment & Plan Note (Signed)
Stable exam with well controlled BP.  Currently taking Lotret, hctz. Tolerating medications without concerns or side effects. Will continue to recommend low sodium diet and current regimen.

## 2023-02-09 NOTE — Progress Notes (Signed)
Date:  02/09/2023   Name:  Joy Roberts   DOB:  07/28/1961   MRN:  [id number]   Chief Complaint: Annual Exam Joy Roberts is a 62 y.o. female who presents today for her Complete Annual Exam. She feels fairly well. She reports exercising - none due to knee pain. She reports she is sleeping poorly. Breast complaints - none.  Mammogram: none DEXA: none Pap smear: 2012 - declined Colonoscopy: declined  Health Maintenance Due  Topic Date Due   HIV Screening  Never done   MAMMOGRAM  Never done   Zoster Vaccines- Shingrix (1 of 2) Never done   OPHTHALMOLOGY EXAM  06/19/2021   DTaP/Tdap/Td (2 - Td or Tdap) 05/10/2022   COVID-19 Vaccine (4 - 2023-24 season) 05/15/2022   Diabetic kidney evaluation - Urine ACR  01/24/2023    Immunization History  Administered Date(s) Administered   Influenza,inj,Quad PF,6+ Mos 09/28/2017, 05/31/2018, 06/12/2019, 07/10/2020, 08/28/2021, 05/28/2022   PFIZER Comirnaty(Gray Top)Covid-19 Tri-Sucrose Vaccine 09/19/2020   PFIZER(Purple Top)SARS-COV-2 Vaccination 12/09/2019, 01/02/2020   Pneumococcal Polysaccharide-23 03/01/2013   Tdap 05/10/2012    Hypertension This is a chronic problem. The problem is controlled. Pertinent negatives include no chest pain, headaches, palpitations or shortness of breath. Past treatments include ACE inhibitors, calcium channel blockers and diuretics. The current treatment provides significant improvement. Hypertensive end-organ damage includes kidney disease. There is no history of CAD/MI or CVA.  Diabetes She presents for her follow-up diabetic visit. She has type 2 diabetes mellitus. Her disease course has been stable. Pertinent negatives for hypoglycemia include no dizziness, headaches, nervousness/anxiousness or tremors. Pertinent negatives for diabetes include no chest pain, no fatigue, no polydipsia and no polyuria. Pertinent negatives for diabetic complications include no CVA.  Hyperlipidemia This is a chronic  problem. The problem is controlled. Pertinent negatives include no chest pain or shortness of breath. Current antihyperlipidemic treatment includes statins.    Lab Results  Component Value Date   NA 142 10/05/2022   K 4.1 10/05/2022   CO2 25 10/05/2022   GLUCOSE 211 (H) 10/05/2022   BUN 14 10/05/2022   CREATININE 1.09 (H) 10/05/2022   CALCIUM 9.8 10/05/2022   EGFR 58 (L) 10/05/2022   GFRNONAA 53 (L) 07/10/2020   Lab Results  Component Value Date   CHOL 134 01/23/2022   HDL 40 01/23/2022   LDLCALC 71 01/23/2022   TRIG 131 01/23/2022   CHOLHDL 3.4 01/23/2022   Lab Results  Component Value Date   TSH 2.320 01/23/2022   Lab Results  Component Value Date   HGBA1C 6.9 (H) 10/05/2022   Lab Results  Component Value Date   WBC 8.0 01/23/2022   HGB 13.3 01/23/2022   HCT 40.3 01/23/2022   MCV 86 01/23/2022   PLT 370 01/23/2022   Lab Results  Component Value Date   ALT 19 01/23/2022   AST 15 01/23/2022   ALKPHOS 101 01/23/2022   BILITOT <0.2 01/23/2022   No results found for: "25OHVITD2", "25OHVITD3", "VD25OH"   Review of Systems  Constitutional:  Negative for chills, fatigue and fever.  HENT:  Negative for congestion, hearing loss, tinnitus, trouble swallowing and voice change.   Eyes:  Negative for visual disturbance.  Respiratory:  Negative for cough, chest tightness, shortness of breath and wheezing.   Cardiovascular:  Negative for chest pain, palpitations and leg swelling.  Gastrointestinal:  Negative for abdominal pain, constipation, diarrhea and vomiting.  Endocrine: Negative for polydipsia and polyuria.  Genitourinary:  Negative for dysuria, frequency, genital  sores, vaginal bleeding and vaginal discharge.  Musculoskeletal:  Positive for arthralgias and gait problem. Negative for joint swelling.  Skin:  Negative for color change and rash.  Neurological:  Negative for dizziness, tremors, light-headedness and headaches.  Hematological:  Negative for adenopathy.  Does not bruise/bleed easily.  Psychiatric/Behavioral:  Negative for dysphoric mood and sleep disturbance. The patient is not nervous/anxious.     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   Mild intermittent asthma without complication 09/26/2017   Calculus of kidney 09/26/2017   BMI 45.0-49.9, adult (HCC) 09/26/2017   Osteoarthritis of multiple joints 03/31/2016   Diabetes mellitus treated with oral medication (HCC) 03/31/2016   Essential hypertension 09/10/2014   Varicose veins of both lower extremities 03/27/2014   Umbilical hernia 09/23/2013   Hyperlipidemia associated with type 2 diabetes mellitus (HCC) 05/10/2012    Allergies  Allergen Reactions   Codeine Nausea And Vomiting   Sulfa Antibiotics Nausea And Vomiting    Past Surgical History:  Procedure Laterality Date   CESAREAN SECTION  2003   LITHOTRIPSY  01/11/2014    Social History   Tobacco Use   Smoking status: Never   Smokeless tobacco: Never  Vaping Use   Vaping Use: Never used  Substance Use Topics   Alcohol use: No    Alcohol/week: 0.0 standard drinks of alcohol   Drug use: No     Medication list has been reviewed and updated.  Current Meds  Medication Sig   albuterol (ACCUNEB) 1.25 MG/3ML nebulizer solution Take 3 mLs (1.25 mg total) by nebulization every 6 (six) hours as needed for wheezing.   albuterol (VENTOLIN HFA) 108 (90 Base) MCG/ACT inhaler    amLODipine-benazepril (LOTREL) 5-20 MG capsule TAKE 1 CAPSULE BY MOUTH EVERY DAY   aspirin EC 81 MG tablet Take by mouth.   atorvastatin (LIPITOR) 40 MG tablet Take 1 tablet (40 mg total) by mouth daily.   fluticasone-salmeterol (ADVAIR DISKUS) 250-50 MCG/ACT AEPB Inhale 1 puff into the lungs in the morning and at bedtime.   glipiZIDE (GLUCOTROL XL) 2.5 MG 24 hr tablet Take 2.5 mg by  mouth daily with breakfast.   hydrochlorothiazide (HYDRODIURIL) 25 MG tablet TAKE 1 TABLET (25 MG TOTAL) BY MOUTH DAILY.   IBUPROFEN PO Take by mouth as needed.   Insulin Pen Needle (PEN NEEDLES) 32G X 5 MM MISC 1 each by Does not apply route once a week.   metFORMIN (GLUCOPHAGE-XR) 500 MG 24 hr tablet TAKE 2 TABLETS BY MOUTH TWICE A DAY   rOPINIRole (REQUIP) 0.25 MG tablet TAKE 1 TABLET BY MOUTH AT BEDTIME   Semaglutide,0.25 or 0.5MG /DOS, (OZEMPIC, 0.25 OR 0.5 MG/DOSE,) 2 MG/3ML SOPN Inject 0.25 mg into the skin once a week for 28 days, THEN 0.5 mg once a week for 14 days.   Spacer/Aero-Holding Chambers (AEROCHAMBER MV) inhaler Use as instructed   [DISCONTINUED] Dulaglutide (TRULICITY) 4.5 MG/0.5ML SOPN Inject 4.5 mg as directed once a week.       02/09/2023    9:26 AM 10/05/2022    9:06 AM 05/28/2022    9:39 AM 01/23/2022   10:52 AM  GAD 7 : Generalized Anxiety Score  Nervous, Anxious, on Edge 0 0 0 0  Control/stop worrying 0 0 0 0  Worry too much - different things 0 0 0 0  Trouble relaxing  2 0 0 0  Restless 0 0 0 0  Easily annoyed or irritable 0 0 0 0  Afraid - awful might happen 0 0 0 0  Total GAD 7 Score 2 0 0 0  Anxiety Difficulty Not difficult at all Not difficult at all Not difficult at all Not difficult at all       02/09/2023    9:25 AM 10/05/2022    9:05 AM 05/28/2022    9:38 AM  Depression screen PHQ 2/9  Decreased Interest 2 2 1   Down, Depressed, Hopeless 0 0 0  PHQ - 2 Score 2 2 1   Altered sleeping 3 3 1   Tired, decreased energy 3 2 1   Change in appetite 0 0 0  Feeling bad or failure about yourself  0 0 0  Trouble concentrating 1 0 0  Moving slowly or fidgety/restless 0 3 1  Suicidal thoughts 0 0 0  PHQ-9 Score 9 10 4   Difficult doing work/chores Somewhat difficult Somewhat difficult Somewhat difficult    BP Readings from Last 3 Encounters:  02/09/23 118/68  10/05/22 124/76  05/28/22 126/76    Physical Exam Vitals and nursing note reviewed.   Constitutional:      General: She is not in acute distress.    Appearance: She is well-developed.  HENT:     Head: Normocephalic and atraumatic.     Right Ear: Tympanic membrane and ear canal normal.     Left Ear: Tympanic membrane and ear canal normal.     Nose:     Right Sinus: No maxillary sinus tenderness.     Left Sinus: No maxillary sinus tenderness.  Eyes:     General: No scleral icterus.       Right eye: No discharge.        Left eye: No discharge.     Conjunctiva/sclera: Conjunctivae normal.  Neck:     Thyroid: No thyromegaly.     Vascular: No carotid bruit.  Cardiovascular:     Rate and Rhythm: Normal rate and regular rhythm.     Pulses: Normal pulses.     Heart sounds: Normal heart sounds.  Pulmonary:     Effort: Pulmonary effort is normal. No respiratory distress.     Breath sounds: No wheezing.  Chest:  Breasts:    Right: No mass, nipple discharge, skin change or tenderness.     Left: No mass, nipple discharge, skin change or tenderness.  Abdominal:     General: Bowel sounds are normal.     Palpations: Abdomen is soft.     Tenderness: There is no abdominal tenderness.  Musculoskeletal:     Cervical back: Normal range of motion. No erythema.     Right lower leg: No edema.     Left lower leg: No edema.  Lymphadenopathy:     Cervical: No cervical adenopathy.  Skin:    General: Skin is warm and dry.     Findings: No rash.  Neurological:     Mental Status: She is alert and oriented to person, place, and time.     Cranial Nerves: No cranial nerve deficit.     Sensory: No sensory deficit.     Deep Tendon Reflexes: Reflexes are normal and symmetric.  Psychiatric:        Attention and Perception: Attention normal.        Mood and Affect: Mood normal.    Diabetic Foot Exam - Simple   Simple Foot Form Diabetic Foot exam was performed  with the following findings: Yes 02/09/2023  9:42 AM  Visual Inspection No deformities, no ulcerations, no other skin  breakdown bilaterally: Yes Sensation Testing Intact to touch and monofilament testing bilaterally: Yes Pulse Check Posterior Tibialis and Dorsalis pulse intact bilaterally: Yes Comments      Wt Readings from Last 3 Encounters:  02/09/23 286 lb (129.7 kg)  10/05/22 288 lb 6.4 oz (130.8 kg)  05/28/22 278 lb (126.1 kg)    BP 118/68   Pulse (!) 102   Ht 5\' 5"  (1.651 m)   Wt 286 lb (129.7 kg)   SpO2 98%   BMI 47.59 kg/m   Assessment and Plan:  Problem List Items Addressed This Visit     Oral lichen planus    Seen by specialist in [facility] Treated topically and them biopsied      Hyperlipidemia associated with type 2 diabetes mellitus (HCC) (Chronic)    Tolerating statin medications.  No side effects noted. LDL is  Lab Results  Component Value Date   LDLCALC 71 01/23/2022        Relevant Medications   Semaglutide,0.25 or 0.5MG /DOS, (OZEMPIC, 0.25 OR 0.5 MG/DOSE,) 2 MG/3ML SOPN   Other Relevant Orders   Lipid panel   Essential hypertension (Chronic)    Stable exam with well controlled BP.  Currently taking Lotret, hctz. Tolerating medications without concerns or side effects. Will continue to recommend low sodium diet and current regimen.       Relevant Orders   CBC with Differential/Platelet   TSH   Diabetes mellitus treated with oral medication (HCC)    Blood sugars stable without hypoglycemic symptoms or events. Currently being treated with metformin, glucotrol but has been unable to get Trulicity. Lab Results  Component Value Date   HGBA1C 6.9 (H) 10/05/2022  Will prescribe Ozempic        Relevant Medications   Semaglutide,0.25 or 0.5MG /DOS, (OZEMPIC, 0.25 OR 0.5 MG/DOSE,) 2 MG/3ML SOPN   Other Relevant Orders   Comprehensive metabolic panel   Hemoglobin A1c   Microalbumin / creatinine urine ratio   BMI 45.0-49.9, adult (HCC)   Relevant Medications   Semaglutide,0.25 or 0.5MG /DOS, (OZEMPIC, 0.25 OR 0.5 MG/DOSE,) 2 MG/3ML SOPN   Other Visit  Diagnoses     Annual physical exam    -  Primary   Relevant Orders   CBC with Differential/Platelet   Comprehensive metabolic panel   Hemoglobin A1c   Lipid panel   TSH   Encounter for screening mammogram for breast cancer       declines mammogram will continue regular self exams   Colon cancer screening       declined   Diabetes mellitus treated with injections of non-insulin medication (HCC)       Relevant Medications   Semaglutide,0.25 or 0.5MG /DOS, (OZEMPIC, 0.25 OR 0.5 MG/DOSE,) 2 MG/3ML SOPN   Insulin Pen Needle (PEN NEEDLES) 32G X 5 MM MISC       Return in about 4 months (around 06/12/2023) for DM, HTN.   Partially dictated using Dragon software, any errors are not intentional.  Reubin Milan, MD [facility] [place], [place]

## 2023-02-13 LAB — LIPID PANEL
Chol/HDL Ratio: 3.2 ratio (ref 0.0–4.4)
Cholesterol, Total: 133 mg/dL (ref 100–199)
HDL: 41 mg/dL (ref >39)
LDL Chol Calc (NIH): 71 mg/dL (ref 0–99)
Triglycerides: 118 mg/dL (ref 0–149)
VLDL Cholesterol Cal: 21 mg/dL (ref 5–40)

## 2023-02-13 LAB — COMPREHENSIVE METABOLIC PANEL WITH GFR
ALT: 24 IU/L (ref 0–32)
AST: 17 IU/L (ref 0–40)
Albumin/Globulin Ratio: 1.5 (ref 1.2–2.2)
Albumin: 4.1 g/dL (ref 3.8–4.9)
Alkaline Phosphatase: 97 IU/L (ref 44–121)
BUN/Creatinine Ratio: 14 (ref 12–28)
BUN: 15 mg/dL (ref 8–27)
Bilirubin Total: 0.3 mg/dL (ref 0.0–1.2)
CO2: 25 mmol/L (ref 20–29)
Calcium: 9.5 mg/dL (ref 8.7–10.3)
Chloride: 100 mmol/L (ref 96–106)
Creatinine, Ser: 1.09 mg/dL — ABNORMAL HIGH (ref 0.57–1.00)
Globulin, Total: 2.7 g/dL (ref 1.5–4.5)
Glucose: 181 mg/dL — ABNORMAL HIGH (ref 70–99)
Potassium: 4.5 mmol/L (ref 3.5–5.2)
Sodium: 139 mmol/L (ref 134–144)
Total Protein: 6.8 g/dL (ref 6.0–8.5)
eGFR: 58 mL/min/1.73 — ABNORMAL LOW

## 2023-02-13 LAB — CBC WITH DIFFERENTIAL/PLATELET
Basophils Absolute: 0 x10E3/uL (ref 0.0–0.2)
Basos: 1 %
EOS (ABSOLUTE): 0.3 x10E3/uL (ref 0.0–0.4)
Eos: 5 %
Hematocrit: 39.3 % (ref 34.0–46.6)
Hemoglobin: 12.8 g/dL (ref 11.1–15.9)
Immature Grans (Abs): 0 x10E3/uL (ref 0.0–0.1)
Immature Granulocytes: 0 %
Lymphocytes Absolute: 2.1 x10E3/uL (ref 0.7–3.1)
Lymphs: 31 %
MCH: 28.6 pg (ref 26.6–33.0)
MCHC: 32.6 g/dL (ref 31.5–35.7)
MCV: 88 fL (ref 79–97)
Monocytes Absolute: 0.6 x10E3/uL (ref 0.1–0.9)
Monocytes: 8 %
Neutrophils Absolute: 3.8 x10E3/uL (ref 1.4–7.0)
Neutrophils: 55 %
Platelets: 354 x10E3/uL (ref 150–450)
RBC: 4.47 x10E6/uL (ref 3.77–5.28)
RDW: 13.4 % (ref 11.7–15.4)
WBC: 6.8 x10E3/uL (ref 3.4–10.8)

## 2023-02-13 LAB — TSH: TSH: 3.03 u[IU]/mL (ref 0.450–4.500)

## 2023-02-13 LAB — MICROALBUMIN / CREATININE URINE RATIO

## 2023-02-13 LAB — HEMOGLOBIN A1C
Est. average glucose Bld gHb Est-mCnc: 169 mg/dL
Hgb A1c MFr Bld: 7.5 % — ABNORMAL HIGH (ref 4.8–5.6)

## 2023-03-05 ENCOUNTER — Other Ambulatory Visit: Payer: Self-pay | Admitting: Internal Medicine

## 2023-03-05 DIAGNOSIS — E1169 Type 2 diabetes mellitus with other specified complication: Secondary | ICD-10-CM

## 2023-03-05 DIAGNOSIS — E118 Type 2 diabetes mellitus with unspecified complications: Secondary | ICD-10-CM

## 2023-03-12 ENCOUNTER — Other Ambulatory Visit: Payer: Self-pay | Admitting: Internal Medicine

## 2023-03-12 DIAGNOSIS — G2581 Restless legs syndrome: Secondary | ICD-10-CM

## 2023-03-13 ENCOUNTER — Other Ambulatory Visit: Payer: Self-pay | Admitting: Internal Medicine

## 2023-03-13 DIAGNOSIS — E118 Type 2 diabetes mellitus with unspecified complications: Secondary | ICD-10-CM

## 2023-03-17 ENCOUNTER — Other Ambulatory Visit: Payer: Self-pay | Admitting: Internal Medicine

## 2023-03-17 DIAGNOSIS — Z79899 Other long term (current) drug therapy: Secondary | ICD-10-CM

## 2023-03-17 LAB — HM DIABETES EYE EXAM

## 2023-03-17 MED ORDER — OZEMPIC (0.25 OR 0.5 MG/DOSE) 2 MG/3ML ~~LOC~~ SOPN
0.5000 mg | PEN_INJECTOR | SUBCUTANEOUS | 0 refills | Status: AC
Start: 2023-03-17 — End: 2023-04-14

## 2023-03-17 NOTE — Telephone Encounter (Signed)
Medication Refill - Medication: Ozempic *Patient states that provider wanted to increase dose at next refill  Has the patient contacted their pharmacy? No. (Agent: If no, request that the patient contact the pharmacy for the refill. If patient does not wish to contact the pharmacy document the reason why and proceed with request.) (Agent: If yes, when and what did the pharmacy advise?)  Preferred Pharmacy (with phone number or street name):  [id number] [place], [place] - [address] Phone: [phone]  Fax: [phone]     Has the patient been seen for an appointment in the last year OR does the patient have an upcoming appointment? Yes.    Agent: Please be advised that RX refills may take up to 3 business days. We ask that you follow-up with your pharmacy.

## 2023-03-17 NOTE — Telephone Encounter (Signed)
Patient states that she is supposed to get an increase for next dose. Routing for approval.

## 2023-04-02 ENCOUNTER — Encounter: Payer: Self-pay | Admitting: Internal Medicine

## 2023-04-16 ENCOUNTER — Other Ambulatory Visit: Payer: Self-pay | Admitting: Internal Medicine

## 2023-04-16 DIAGNOSIS — E119 Type 2 diabetes mellitus without complications: Secondary | ICD-10-CM

## 2023-04-16 NOTE — Telephone Encounter (Signed)
Please review. Not on medication list.  KP

## 2023-04-16 NOTE — Telephone Encounter (Signed)
Requested medication (s) are due for refill today - unsure  Requested medication (s) are on the active medication list -no  Future visit scheduled - yes  Last refill: 03/17/23  Notes to clinic: sent for review- medication no longer listed on current medication list  Requested Prescriptions  Pending Prescriptions Disp Refills   OZEMPIC, 0.25 OR 0.5 MG/DOSE, 2 MG/3ML SOPN [Pharmacy Med Name: OZEMPIC 0.25-0.5 MG/DOSE PEN]      Sig: Inject 0.5 mg into the skin once a week for 28 days.     Endocrinology:  Diabetes - GLP-1 Receptor Agonists - semaglutide Failed - 04/16/2023  2:38 AM      Failed - HBA1C in normal range and within 180 days    Hemoglobin A1C  Date Value Ref Range Status  05/31/2017 7.8  Final   Hgb A1c MFr Bld  Date Value Ref Range Status  02/09/2023 7.5 (H) 4.8 - 5.6 % Final    Comment:             Prediabetes: 5.7 - 6.4          Diabetes: >6.4          Glycemic control for adults with diabetes: <7.0          Failed - Cr in normal range and within 360 days    Creatinine  Date Value Ref Range Status  01/13/2014 1.19 0.60 - 1.30 mg/dL Final   Creatinine, Ser  Date Value Ref Range Status  02/09/2023 1.09 (H) 0.57 - 1.00 mg/dL Final         Passed - Valid encounter within last 6 months    Recent Outpatient Visits           2 months ago Annual physical exam   [place] [facility] at [facility], Nyoka Cowden, MD   6 months ago Essential hypertension   [place] [facility] at [facility], Nyoka Cowden, MD   10 months ago Type II diabetes mellitus with complication [facility])   [place] [facility] at [facility], Nyoka Cowden, MD   1 year ago Annual physical exam   [facility] at [facility], Nyoka Cowden, MD   1 year ago Type II diabetes mellitus with complication [facility])   [place] [facility] at [facility], Nyoka Cowden, MD       Future Appointments             In 1 month Judithann Graves Nyoka Cowden, MD [facility] at [facility], [facility]               Requested Prescriptions  Pending Prescriptions Disp Refills   OZEMPIC, 0.25 OR 0.5 MG/DOSE, 2 MG/3ML SOPN [Pharmacy Med Name: OZEMPIC 0.25-0.5 MG/DOSE PEN]      Sig: Inject 0.5 mg into the skin once a week for 28 days.     Endocrinology:  Diabetes - GLP-1 Receptor Agonists - semaglutide Failed - 04/16/2023  2:38 AM      Failed - HBA1C in normal range and within 180 days    Hemoglobin A1C  Date Value Ref Range Status  05/31/2017 7.8  Final   Hgb A1c MFr Bld  Date Value Ref Range Status  02/09/2023 7.5 (H) 4.8 - 5.6 % Final    Comment:  Prediabetes: 5.7 - 6.4          Diabetes: >6.4          Glycemic control for adults with diabetes: <7.0          Failed - Cr in normal range and within 360 days    Creatinine  Date Value Ref Range Status  01/13/2014 1.19 0.60 - 1.30 mg/dL Final   Creatinine, Ser  Date Value Ref Range Status  02/09/2023 1.09 (H) 0.57 - 1.00 mg/dL Final         Passed - Valid encounter within last 6 months    Recent Outpatient Visits           2 months ago Annual physical exam   [place] [facility] at [facility], Nyoka Cowden, MD   6 months ago Essential hypertension   [place] [facility] at [facility], Nyoka Cowden, MD   10 months ago Type II diabetes mellitus with complication [facility])   [place] [facility] at [facility], Nyoka Cowden, MD   1 year ago Annual physical exam   [facility] at [facility], Nyoka Cowden, MD   1 year ago Type II diabetes mellitus with complication [facility])   [place] [facility] at [facility], Nyoka Cowden, MD       Future Appointments             In 1 month  Judithann Graves, Nyoka Cowden, MD [facility] at [place], [facility]

## 2023-04-17 ENCOUNTER — Other Ambulatory Visit: Payer: Self-pay | Admitting: Internal Medicine

## 2023-04-17 DIAGNOSIS — E119 Type 2 diabetes mellitus without complications: Secondary | ICD-10-CM

## 2023-04-17 MED ORDER — SEMAGLUTIDE (1 MG/DOSE) 4 MG/3ML ~~LOC~~ SOPN
1.0000 mg | PEN_INJECTOR | SUBCUTANEOUS | 1 refills | Status: AC
Start: 2023-04-17 — End: ?

## 2023-06-14 ENCOUNTER — Encounter: Payer: Self-pay | Admitting: Internal Medicine

## 2023-06-14 ENCOUNTER — Ambulatory Visit: Payer: Managed Care, Other (non HMO) | Admitting: Internal Medicine

## 2023-06-14 VITALS — BP 124/78 | HR 67 | Ht 65.0 in | Wt 283.0 lb

## 2023-06-14 DIAGNOSIS — G2581 Restless legs syndrome: Secondary | ICD-10-CM | POA: Insufficient documentation

## 2023-06-14 DIAGNOSIS — E119 Type 2 diabetes mellitus without complications: Secondary | ICD-10-CM

## 2023-06-14 DIAGNOSIS — Z23 Encounter for immunization: Secondary | ICD-10-CM

## 2023-06-14 DIAGNOSIS — I10 Essential (primary) hypertension: Secondary | ICD-10-CM | POA: Diagnosis not present

## 2023-06-14 DIAGNOSIS — Z7984 Long term (current) use of oral hypoglycemic drugs: Secondary | ICD-10-CM | POA: Diagnosis not present

## 2023-06-14 LAB — POCT GLYCOSYLATED HEMOGLOBIN (HGB A1C): Hemoglobin A1C: 6.5 % — AB (ref 4.0–5.6)

## 2023-06-14 MED ORDER — SEMAGLUTIDE (1 MG/DOSE) 4 MG/3ML ~~LOC~~ SOPN
1.0000 mg | PEN_INJECTOR | SUBCUTANEOUS | 1 refills | Status: DC
Start: 2023-06-14 — End: 2023-08-10

## 2023-06-14 NOTE — Assessment & Plan Note (Signed)
On Requip low dose at bedtime Having some other leg aches that may be RLS vs OA knees Will try 2 Requip and call for new Rx if helpful.

## 2023-06-14 NOTE — Assessment & Plan Note (Addendum)
Blood sugars stable without hypoglycemic symptoms or events. Current regimen is metformin, glipizide and Ozempic 1 mg. Changes made last visit are changing Trulicity to Ozempic due to availability. Lab Results  Component Value Date   HGBA1C 7.5 (H) 02/09/2023  She has lost 5 lbs. A1C today = 6.5

## 2023-06-14 NOTE — Progress Notes (Signed)
Date:  06/14/2023   Name:  Joy Roberts   DOB:  11-May-1961   MRN:  [id number]   Chief Complaint: Diabetes and Hypertension  Diabetes She presents for her follow-up diabetic visit. She has type 2 diabetes mellitus. Pertinent negatives for hypoglycemia include no headaches, nervousness/anxiousness or tremors. Pertinent negatives for diabetes include no chest pain, no fatigue, no polydipsia and no polyuria. Current diabetic treatments: metformin, glipizide and Ozempic.  Hypertension This is a chronic problem. The problem is controlled. Pertinent negatives include no chest pain, headaches, palpitations or shortness of breath. Past treatments include calcium channel blockers and ACE inhibitors. The current treatment provides significant improvement.    Lab Results  Component Value Date   NA 139 02/09/2023   K 4.5 02/09/2023   CO2 25 02/09/2023   GLUCOSE 181 (H) 02/09/2023   BUN 15 02/09/2023   CREATININE 1.09 (H) 02/09/2023   CALCIUM 9.5 02/09/2023   EGFR 58 (L) 02/09/2023   GFRNONAA 53 (L) 07/10/2020   Lab Results  Component Value Date   CHOL 133 02/09/2023   HDL 41 02/09/2023   LDLCALC 71 02/09/2023   TRIG 118 02/09/2023   CHOLHDL 3.2 02/09/2023   Lab Results  Component Value Date   TSH 3.030 02/09/2023   Lab Results  Component Value Date   HGBA1C 6.5 (A) 06/14/2023   Lab Results  Component Value Date   WBC 6.8 02/09/2023   HGB 12.8 02/09/2023   HCT 39.3 02/09/2023   MCV 88 02/09/2023   PLT 354 02/09/2023   Lab Results  Component Value Date   ALT 24 02/09/2023   AST 17 02/09/2023   ALKPHOS 97 02/09/2023   BILITOT 0.3 02/09/2023   No results found for: "25OHVITD2", "25OHVITD3", "VD25OH"   Review of Systems  Constitutional:  Negative for appetite change, fatigue, fever and unexpected weight change.  HENT:  Negative for tinnitus and trouble swallowing.   Eyes:  Negative for visual disturbance.  Respiratory:  Negative for cough, chest tightness and  shortness of breath.   Cardiovascular:  Negative for chest pain, palpitations and leg swelling.  Gastrointestinal:  Negative for abdominal pain.  Endocrine: Negative for polydipsia and polyuria.  Genitourinary:  Negative for dysuria and hematuria.  Musculoskeletal:  Positive for arthralgias, gait problem and myalgias (esp in the evening.).  Neurological:  Negative for tremors, numbness and headaches.  Psychiatric/Behavioral:  Negative for dysphoric mood and sleep disturbance. The patient is not nervous/anxious.     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   Mild intermittent asthma without complication 09/26/2017   Calculus of kidney 09/26/2017   BMI 45.0-49.9, adult (HCC) 09/26/2017   Osteoarthritis of multiple joints 03/31/2016   Diabetes mellitus treated with oral medication (HCC) 03/31/2016   Essential hypertension 09/10/2014   Varicose veins of both lower extremities 03/27/2014   Umbilical hernia 09/23/2013   Hyperlipidemia associated with type 2 diabetes mellitus (HCC) 05/10/2012    Allergies  Allergen Reactions   Codeine Nausea And Vomiting   Sulfa Antibiotics Nausea And Vomiting    Past Surgical History:  Procedure Laterality Date   CESAREAN SECTION  2003   LITHOTRIPSY  01/11/2014    Social History   Tobacco Use   Smoking status: Never   Smokeless tobacco: Never  Vaping Use  Vaping status: Never Used  Substance Use Topics   Alcohol use: No    Alcohol/week: 0.0 standard drinks of alcohol   Drug use: No     Medication list has been reviewed and updated.  Current Meds  Medication Sig   albuterol (ACCUNEB) 1.25 MG/3ML nebulizer solution Take 3 mLs (1.25 mg total) by nebulization every 6 (six) hours as needed for wheezing.   albuterol (VENTOLIN HFA) 108 (90  Base) MCG/ACT inhaler    amLODipine-benazepril (LOTREL) 5-20 MG capsule TAKE 1 CAPSULE BY MOUTH EVERY DAY   aspirin EC 81 MG tablet Take by mouth.   atorvastatin (LIPITOR) 40 MG tablet TAKE 1 TABLET BY MOUTH EVERY DAY   fluticasone-salmeterol (ADVAIR DISKUS) 250-50 MCG/ACT AEPB Inhale 1 puff into the lungs in the morning and at bedtime.   glipiZIDE (GLUCOTROL XL) 2.5 MG 24 hr tablet TAKE 1 TABLET BY MOUTH EVERY DAY   hydrochlorothiazide (HYDRODIURIL) 25 MG tablet TAKE 1 TABLET (25 MG TOTAL) BY MOUTH DAILY.   IBUPROFEN PO Take by mouth as needed.   Insulin Pen Needle (PEN NEEDLES) 32G X 5 MM MISC 1 each by Does not apply route once a week.   metFORMIN (GLUCOPHAGE-XR) 500 MG 24 hr tablet TAKE 2 TABLETS BY MOUTH TWICE A DAY   rOPINIRole (REQUIP) 0.25 MG tablet TAKE 1 TABLET BY MOUTH EVERYDAY AT BEDTIME   Spacer/Aero-Holding Chambers (AEROCHAMBER MV) inhaler Use as instructed   [DISCONTINUED] Semaglutide, 1 MG/DOSE, 4 MG/3ML SOPN Inject 1 mg into the skin once a week.       02/09/2023    9:26 AM 10/05/2022    9:06 AM 05/28/2022    9:39 AM 01/23/2022   10:52 AM  GAD 7 : Generalized Anxiety Score  Nervous, Anxious, on Edge 0 0 0 0  Control/stop worrying 0 0 0 0  Worry too much - different things 0 0 0 0  Trouble relaxing 2 0 0 0  Restless 0 0 0 0  Easily annoyed or irritable 0 0 0 0  Afraid - awful might happen 0 0 0 0  Total GAD 7 Score 2 0 0 0  Anxiety Difficulty Not difficult at all Not difficult at all Not difficult at all Not difficult at all       02/09/2023    9:25 AM 10/05/2022    9:05 AM 05/28/2022    9:38 AM  Depression screen PHQ 2/9  Decreased Interest 2 2 1   Down, Depressed, Hopeless 0 0 0  PHQ - 2 Score 2 2 1   Altered sleeping 3 3 1   Tired, decreased energy 3 2 1   Change in appetite 0 0 0  Feeling bad or failure about yourself  0 0 0  Trouble concentrating 1 0 0  Moving slowly or fidgety/restless 0 3 1  Suicidal thoughts 0 0 0  PHQ-9 Score 9 10 4   Difficult doing  work/chores Somewhat difficult Somewhat difficult Somewhat difficult    BP Readings from Last 3 Encounters:  06/14/23 124/78  02/09/23 118/68  10/05/22 124/76    Physical Exam Vitals and nursing note reviewed.  Constitutional:      General: She is not in acute distress.    Appearance: She is well-developed.  HENT:     Head: Normocephalic and atraumatic.  Neck:     Vascular: No carotid bruit.  Cardiovascular:     Rate and Rhythm: Normal rate and regular rhythm.  Pulmonary:     Effort: Pulmonary effort is normal. No respiratory distress.  Breath sounds: No wheezing or rhonchi.  Musculoskeletal:        General: Tenderness present. No swelling.     Cervical back: Normal range of motion.     Right knee: Bony tenderness present. Decreased range of motion.     Left knee: Bony tenderness present. Decreased range of motion.  Lymphadenopathy:     Cervical: No cervical adenopathy.  Skin:    General: Skin is warm and dry.     Findings: No rash.  Neurological:     General: No focal deficit present.     Mental Status: She is alert and oriented to person, place, and time.  Psychiatric:        Mood and Affect: Mood normal.        Behavior: Behavior normal.     Wt Readings from Last 3 Encounters:  06/14/23 283 lb (128.4 kg)  02/09/23 286 lb (129.7 kg)  10/05/22 288 lb 6.4 oz (130.8 kg)    BP 124/78   Pulse 67   Ht 5\' 5"  (1.651 m)   Wt 283 lb (128.4 kg)   SpO2 98%   BMI 47.09 kg/m   Assessment and Plan:  Problem List Items Addressed This Visit       Unprioritized   Diabetes mellitus treated with oral medication (HCC) - Primary    Blood sugars stable without hypoglycemic symptoms or events. Current regimen is metformin, glipizide and Ozempic 1 mg. Changes made last visit are changing Trulicity to Ozempic due to availability. Lab Results  Component Value Date   HGBA1C 7.5 (H) 02/09/2023  She has lost 5 lbs. A1C today = 6.5       Relevant Medications    Semaglutide, 1 MG/DOSE, 4 MG/3ML SOPN   Other Relevant Orders   POCT glycosylated hemoglobin (Hb A1C) (Completed)   Microalbumin / creatinine urine ratio   Essential hypertension (Chronic)    Normal exam with stable BP on amlodipine, hctz and benazepril. No concerns or side effects to current medication. No change in regimen; continue low sodium diet.       Restless leg syndrome    On Requip low dose at bedtime Having some other leg aches that may be RLS vs OA knees Will try 2 Requip and call for new Rx if helpful.       Return in about 8 months (around 02/11/2024) for CPX.    Reubin Milan, MD [facility] [place]

## 2023-06-14 NOTE — Assessment & Plan Note (Signed)
Normal exam with stable BP on amlodipine, hctz and benazepril. No concerns or side effects to current medication. No change in regimen; continue low sodium diet.

## 2023-06-15 LAB — MICROALBUMIN / CREATININE URINE RATIO
Creatinine, Urine: 133.2 mg/dL
Microalb/Creat Ratio: 30 mg/g{creat} — ABNORMAL HIGH (ref 0–29)
Microalbumin, Urine: 40.3 ug/mL

## 2023-07-07 ENCOUNTER — Encounter: Payer: Self-pay | Admitting: Internal Medicine

## 2023-07-09 ENCOUNTER — Ambulatory Visit: Payer: Managed Care, Other (non HMO) | Admitting: Family Medicine

## 2023-07-09 ENCOUNTER — Encounter: Payer: Self-pay | Admitting: Family Medicine

## 2023-07-09 VITALS — BP 134/80 | HR 100 | Ht 65.0 in | Wt 283.0 lb

## 2023-07-09 DIAGNOSIS — M1711 Unilateral primary osteoarthritis, right knee: Secondary | ICD-10-CM

## 2023-07-09 DIAGNOSIS — M1712 Unilateral primary osteoarthritis, left knee: Secondary | ICD-10-CM

## 2023-07-09 DIAGNOSIS — R21 Rash and other nonspecific skin eruption: Secondary | ICD-10-CM | POA: Diagnosis not present

## 2023-07-09 MED ORDER — CLOTRIMAZOLE-BETAMETHASONE 1-0.05 % EX CREA: 1.0000 | TOPICAL_CREAM | Freq: Every day | CUTANEOUS | 1 refills | Status: AC

## 2023-07-09 NOTE — Progress Notes (Unsigned)
     Primary Care / Sports Medicine Office Visit  Patient Information:  Patient ID: Joy Roberts, female DOB: 13-Aug-1961 Age: 62 y.o. MRN: [id number]   Joy Roberts is a pleasant 62 y.o. female presenting with the following:  Chief Complaint  Patient presents with   Rash    X4 months, red, not going away, little bigger, used cortisone cream doesn't help     Vitals:   07/09/23 1339  BP: 134/80  Pulse: 100  SpO2: 96%   Vitals:   07/09/23 1339  Weight: 283 lb (128.4 kg)  Height: 5\' 5"  (1.651 m)   Body mass index is 47.09 kg/m.  No results found.   Independent interpretation of notes and tests performed by another provider:   None  Procedures performed:   None  Pertinent History, Exam, Impression, and Recommendations:   Problem List Items Addressed This Visit       Musculoskeletal and Integument   Rash - Primary    48-month history of erythematous rash at the inferomedial aspect of her right lower leg, was advised OTC steroid cream which did help with her symptoms however without resolution.  Exam demonstrates slightly rough textured rash, see image below:    Document Information  Photos  Right inferomedial lower leg  07/09/2023 13:58  Attached To:  Office Visit on 07/09/23 with Jerrol Banana, MD  Source Information  Jerrol Banana, MD  [facility] [place]  Document History    Findings can represent tinea infection, stasis dermatitis, eczema, lichen simplex chronicus.  Plan: - Use Lotrisone daily x 1-2 weeks - Referral placed to dermatology, keep this visit if symptoms do not resolve with Lotrisone - Contact us for any change/worsening symptoms      Relevant Medications   clotrimazole-betamethasone (LOTRISONE) cream   Other Relevant Orders   Ambulatory referral to Dermatology   Primary osteoarthritis of right knee    Patient brings up chronic bilateral knee pain, recalcitrant response to genicular nerve blocks at outside  facility, has had positive response from cortisone roughly 2 years prior however is a diabetic.  We reviewed additional treatment options given her comorbid health conditions and treatments to date.  Plan: - We will seek authorization for Zilretta and contact to coordinate follow-up - May benefit from viscosupplementation in the future      Primary osteoarthritis of left knee    See additional assessment(s) for plan details.        Orders & Medications Medications:  Meds ordered this encounter  Medications   clotrimazole-betamethasone (LOTRISONE) cream    Sig: Apply 1 Application topically daily.    Dispense:  45 g    Refill:  1   Orders Placed This Encounter  Procedures   Ambulatory referral to Dermatology     No follow-ups on file.     Jerrol Banana, MD, [facility]   Primary Care Sports Medicine Primary Care and Sports Medicine at [facility]

## 2023-07-09 NOTE — Assessment & Plan Note (Signed)
See additional assessment(s) for plan details. 

## 2023-07-09 NOTE — Assessment & Plan Note (Addendum)
>>  ASSESSMENT AND PLAN FOR PRIMARY OSTEOARTHRITIS OF BOTH KNEES WRITTEN ON 02/25/2024 11:29 AM BY Burgundy Matuszak, Chales [place], MD   >>ASSESSMENT AND PLAN FOR PRIMARY OSTEOARTHRITIS OF RIGHT KNEE WRITTEN ON 07/09/2023  4:14 PM BY MATTHEWS, JASON J, MD  Patient brings up chronic bilateral knee pain, recalcitrant response to genicular nerve blocks at outside facility, has had positive response from cortisone roughly 2 years prior however is a diabetic.  We reviewed additional treatment options given her comorbid health conditions and treatments to date.  Plan: - We will seek authorization for Zilretta  and contact to coordinate follow-up - May benefit from viscosupplementation in the future   >>ASSESSMENT AND PLAN FOR PRIMARY OSTEOARTHRITIS OF LEFT KNEE WRITTEN ON 07/09/2023  4:15 PM BY MATTHEWS, JASON J, MD  See additional assessment(s) for plan details.

## 2023-07-09 NOTE — Assessment & Plan Note (Signed)
10-month history of erythematous rash at the inferomedial aspect of her right lower leg, was advised OTC steroid cream which did help with her symptoms however without resolution.  Exam demonstrates slightly rough textured rash, see image below:    Document Information  Photos  Right inferomedial lower leg  07/09/2023 13:58  Attached To:  Office Visit on 07/09/23 with Jerrol Banana, MD  Source Information  Jerrol Banana, MD  [facility] [place]  Document History    Findings can represent tinea infection, stasis dermatitis, eczema, lichen simplex chronicus.  Plan: - Use Lotrisone daily x 1-2 weeks - Referral placed to dermatology, keep this visit if symptoms do not resolve with Lotrisone - Contact us for any change/worsening symptoms

## 2023-07-09 NOTE — Assessment & Plan Note (Signed)
Patient brings up chronic bilateral knee pain, recalcitrant response to genicular nerve blocks at outside facility, has had positive response from cortisone roughly 2 years prior however is a diabetic.  We reviewed additional treatment options given her comorbid health conditions and treatments to date.  Plan: - We will seek authorization for Zilretta and contact to coordinate follow-up - May benefit from viscosupplementation in the future

## 2023-07-12 ENCOUNTER — Ambulatory Visit: Payer: Managed Care, Other (non HMO) | Admitting: Internal Medicine

## 2023-07-12 NOTE — Patient Instructions (Signed)
-   Use Lotrisone daily x 1-2 weeks - Referral placed to dermatology, keep this visit if symptoms do not resolve with Lotrisone - Contact us for any change/worsening symptoms

## 2023-08-02 ENCOUNTER — Other Ambulatory Visit: Payer: Self-pay | Admitting: Internal Medicine

## 2023-08-02 ENCOUNTER — Encounter: Payer: Self-pay | Admitting: Internal Medicine

## 2023-08-02 DIAGNOSIS — G2581 Restless legs syndrome: Secondary | ICD-10-CM

## 2023-08-02 MED ORDER — ROPINIROLE HCL 0.5 MG PO TABS: ORAL_TABLET | ORAL | 1 refills | Status: DC

## 2023-08-09 ENCOUNTER — Other Ambulatory Visit: Payer: Self-pay | Admitting: Internal Medicine

## 2023-08-09 DIAGNOSIS — E119 Type 2 diabetes mellitus without complications: Secondary | ICD-10-CM

## 2023-08-10 NOTE — Telephone Encounter (Signed)
Labs in date  Requested Prescriptions  Pending Prescriptions Disp Refills   Semaglutide, 1 MG/DOSE, (OZEMPIC, 1 MG/DOSE,) 4 MG/3ML SOPN [Pharmacy Med Name: OZEMPIC 4 MG/3 ML (1 MG/DOSE)] 3 mL 1    Sig: INJECT 1MG  INTO THE SKIN ONCE A WEEK     Endocrinology:  Diabetes - GLP-1 Receptor Agonists - semaglutide Failed - 08/09/2023 11:29 AM      Failed - HBA1C in normal range and within 180 days    Hemoglobin A1C  Date Value Ref Range Status  06/14/2023 6.5 (A) 4.0 - 5.6 % Final  05/31/2017 7.8  Final   Hgb A1c MFr Bld  Date Value Ref Range Status  02/09/2023 7.5 (H) 4.8 - 5.6 % Final    Comment:             Prediabetes: 5.7 - 6.4          Diabetes: >6.4          Glycemic control for adults with diabetes: <7.0          Failed - Cr in normal range and within 360 days    Creatinine  Date Value Ref Range Status  01/13/2014 1.19 0.60 - 1.30 mg/dL Final   Creatinine, Ser  Date Value Ref Range Status  02/09/2023 1.09 (H) 0.57 - 1.00 mg/dL Final         Passed - Valid encounter within last 6 months    Recent Outpatient Visits           1 month ago Rash   [place] [facility] at [facility] [place] [facility] Royalty, Ocie Bob, MD   1 month ago Diabetes mellitus treated with oral medication [facility])   [place] [facility] Medicine at [facility], Nyoka Cowden, MD   6 months ago Annual physical exam   [facility] at [facility], Nyoka Cowden, MD   10 months ago Essential hypertension   [place] [facility] & Sports Medicine at [facility], Nyoka Cowden, MD   1 year ago Type II diabetes mellitus with complication [facility])    Primary Care & Sports Medicine at [facility], Nyoka Cowden, MD       Future Appointments             In 6 months Judithann Graves, Nyoka Cowden, MD [facility] at [facility], [facility]

## 2023-09-02 ENCOUNTER — Other Ambulatory Visit: Payer: Self-pay | Admitting: Internal Medicine

## 2023-09-02 DIAGNOSIS — E118 Type 2 diabetes mellitus with unspecified complications: Secondary | ICD-10-CM

## 2023-09-03 NOTE — Telephone Encounter (Signed)
Requested Prescriptions  Pending Prescriptions Disp Refills   metFORMIN (GLUCOPHAGE-XR) 500 MG 24 hr tablet [Pharmacy Med Name: METFORMIN HCL ER 500 MG TABLET] 360 tablet 1    Sig: TAKE 2 TABLETS BY MOUTH TWICE A DAY     Endocrinology:  Diabetes - Biguanides Failed - 09/03/2023 10:21 AM      Failed - Cr in normal range and within 360 days    Creatinine  Date Value Ref Range Status  01/13/2014 1.19 0.60 - 1.30 mg/dL Final   Creatinine, Ser  Date Value Ref Range Status  02/09/2023 1.09 (H) 0.57 - 1.00 mg/dL Final         Failed - eGFR in normal range and within 360 days    EGFR (African American)  Date Value Ref Range Status  01/13/2014 >60  Final   GFR calc Af Amer  Date Value Ref Range Status  07/10/2020 61 >59 mL/min/1.73 Final    Comment:    **In accordance with recommendations from the NKF-ASN Task force,**   [facility] is in the process of updating its eGFR calculation to the   2021 CKD-EPI creatinine equation that estimates kidney function   without a race variable.    EGFR (Non-African Amer.)  Date Value Ref Range Status  01/13/2014 53 (L)  Final    Comment:    eGFR values <29mL/min/1.73 m2 may be an indication of chronic kidney disease (CKD). Calculated eGFR is useful in patients with stable renal function. The eGFR calculation will not be reliable in acutely ill patients when serum creatinine is changing rapidly. It is not useful in  patients on dialysis. The eGFR calculation may not be applicable to patients at the low and high extremes of body sizes, pregnant women, and vegetarians.    GFR calc non Af Amer  Date Value Ref Range Status  07/10/2020 53 (L) >59 mL/min/1.73 Final   eGFR  Date Value Ref Range Status  02/09/2023 58 (L) >59 mL/min/1.73 Final         Failed - B12 Level in normal range and within 720 days    No results found for: "VITAMINB12"       Passed - HBA1C is between 0 and 7.9 and within 180 days    Hemoglobin A1C  Date Value Ref Range  Status  06/14/2023 6.5 (A) 4.0 - 5.6 % Final  05/31/2017 7.8  Final   Hgb A1c MFr Bld  Date Value Ref Range Status  02/09/2023 7.5 (H) 4.8 - 5.6 % Final    Comment:             Prediabetes: 5.7 - 6.4          Diabetes: >6.4          Glycemic control for adults with diabetes: <7.0          Passed - Valid encounter within last 6 months    Recent Outpatient Visits           1 month ago Rash   [place] [facility] at [facility] [place] [facility] Royalty, Ocie Bob, MD   2 months ago Diabetes mellitus treated with oral medication [facility])   [place] [facility] Care [facility] Sports Medicine at [facility], Nyoka Cowden, MD   6 months ago Annual physical exam   [facility] at [facility], Nyoka Cowden, MD   11 months ago Essential hypertension   [place] [facility] Sports Medicine at [facility]  Charlann Boxer, MD   1 year ago Type II diabetes mellitus with complication [facility])   [place] [facility] Sports Medicine at [facility], Nyoka Cowden, MD       Future Appointments             In 5 months Judithann Graves Nyoka Cowden, MD [facility] Primary Care & Sports Medicine at [facility], [facility]            Passed - CBC within normal limits and completed in the last 12 months    WBC  Date Value Ref Range Status  02/09/2023 6.8 3.4 - 10.8 x10E3/uL Final  02/24/2018 11.2 (H) 3.6 - 11.0 K/uL Final   RBC  Date Value Ref Range Status  02/09/2023 4.47 3.77 - 5.28 x10E6/uL Final  02/24/2018 4.67 3.80 - 5.20 MIL/uL Final   Hemoglobin  Date Value Ref Range Status  02/09/2023 12.8 11.1 - 15.9 g/dL Final   Hematocrit  Date Value Ref Range Status  02/09/2023 39.3 34.0 - 46.6 % Final   MCHC  Date Value Ref Range Status  02/09/2023 32.6 31.5 - 35.7 g/dL Final  13/04/6577 46.9 32.0 - 36.0 g/dL Final   [facility]  Date Value Ref Range Status  02/09/2023 28.6 26.6 - 33.0 pg Final  02/24/2018 28.5 26.0 - 34.0 pg Final    MCV  Date Value Ref Range Status  02/09/2023 88 79 - 97 fL Final  01/13/2014 85 80 - 100 fL Final   No results found for: "PLTCOUNTKUC", "LABPLAT", "POCPLA" RDW  Date Value Ref Range Status  02/09/2023 13.4 11.7 - 15.4 % Final  01/13/2014 14.0 11.5 - 14.5 % Final

## 2023-09-30 ENCOUNTER — Other Ambulatory Visit: Payer: Self-pay | Admitting: Internal Medicine

## 2023-09-30 DIAGNOSIS — E119 Type 2 diabetes mellitus without complications: Secondary | ICD-10-CM

## 2023-09-30 NOTE — Telephone Encounter (Signed)
Requested Prescriptions  Pending Prescriptions Disp Refills   Semaglutide, 1 MG/DOSE, (OZEMPIC, 1 MG/DOSE,) 4 MG/3ML SOPN [Pharmacy Med Name: OZEMPIC 4 MG/3 ML (1 MG/DOSE)] 3 mL 1    Sig: INJECT 1 MG INTO THE SKIN ONE TIME PER WEEK     Endocrinology:  Diabetes - GLP-1 Receptor Agonists - semaglutide Failed - 09/30/2023 11:54 AM      Failed - HBA1C in normal range and within 180 days    Hemoglobin A1C  Date Value Ref Range Status  06/14/2023 6.5 (A) 4.0 - 5.6 % Final  05/31/2017 7.8  Final   Hgb A1c MFr Bld  Date Value Ref Range Status  02/09/2023 7.5 (H) 4.8 - 5.6 % Final    Comment:             Prediabetes: 5.7 - 6.4          Diabetes: >6.4          Glycemic control for adults with diabetes: <7.0          Failed - Cr in normal range and within 360 days    Creatinine  Date Value Ref Range Status  01/13/2014 1.19 0.60 - 1.30 mg/dL Final   Creatinine, Ser  Date Value Ref Range Status  02/09/2023 1.09 (H) 0.57 - 1.00 mg/dL Final         Passed - Valid encounter within last 6 months    Recent Outpatient Visits           2 months ago Rash   [place] [facility] at [facility] [place] [facility] Royalty, Ocie Bob, MD   3 months ago Diabetes mellitus treated with oral medication [facility])   [place] [facility] Care & Sports Medicine at [facility], Nyoka Cowden, MD   7 months ago Annual physical exam   [facility] at [facility], Nyoka Cowden, MD   12 months ago Essential hypertension   [place] Primary Care & Sports Medicine at [facility], Nyoka Cowden, MD   1 year ago Type II diabetes mellitus with complication [facility])   [place] Primary Care & Sports Medicine at [facility], Nyoka Cowden, MD       Future Appointments             In 4 months Judithann Graves, Nyoka Cowden, MD [facility] Primary Care [facility] Medicine at [facility], [facility]

## 2023-11-25 ENCOUNTER — Other Ambulatory Visit: Payer: Self-pay | Admitting: Internal Medicine

## 2023-11-25 DIAGNOSIS — E119 Type 2 diabetes mellitus without complications: Secondary | ICD-10-CM

## 2023-11-25 NOTE — Telephone Encounter (Signed)
 Requested Prescriptions  Pending Prescriptions Disp Refills   Semaglutide, 1 MG/DOSE, (OZEMPIC, 1 MG/DOSE,) 4 MG/3ML SOPN [Pharmacy Med Name: OZEMPIC 4 MG/3 ML (1 MG/DOSE)] 3 mL 1    Sig: INJECT 1 MG INTO THE SKIN ONE TIME PER WEEK     Endocrinology:  Diabetes - GLP-1 Receptor Agonists - semaglutide Failed - 11/25/2023 12:55 PM      Failed - HBA1C in normal range and within 180 days    Hemoglobin A1C  Date Value Ref Range Status  06/14/2023 6.5 (A) 4.0 - 5.6 % Final  05/31/2017 7.8  Final   Hgb A1c MFr Bld  Date Value Ref Range Status  02/09/2023 7.5 (H) 4.8 - 5.6 % Final    Comment:             Prediabetes: 5.7 - 6.4          Diabetes: >6.4          Glycemic control for adults with diabetes: <7.0          Failed - Cr in normal range and within 360 days    Creatinine  Date Value Ref Range Status  01/13/2014 1.19 0.60 - 1.30 mg/dL Final   Creatinine, Ser  Date Value Ref Range Status  02/09/2023 1.09 (H) 0.57 - 1.00 mg/dL Final         Passed - Valid encounter within last 6 months    Recent Outpatient Visits           4 months ago Rash   [place] [facility] at [facility] [place], Ocie Bob, MD   5 months ago Diabetes mellitus treated with oral medication [facility])   [place] [facility] at [facility], Nyoka Cowden, MD   9 months ago Annual physical exam   [facility] at [facility], Nyoka Cowden, MD   1 year ago Essential hypertension   [place] [facility] at [facility], Nyoka Cowden, MD   1 year ago Type II diabetes mellitus with complication [facility])   [place] [facility] at [facility], Nyoka Cowden, MD       Future Appointments             In 2 months Judithann Graves, Nyoka Cowden, MD [facility] at [facility], [facility]

## 2024-01-01 ENCOUNTER — Other Ambulatory Visit: Payer: Self-pay | Admitting: Internal Medicine

## 2024-01-01 DIAGNOSIS — I10 Essential (primary) hypertension: Secondary | ICD-10-CM

## 2024-01-03 NOTE — Telephone Encounter (Signed)
 Requested Prescriptions  Pending Prescriptions Disp Refills   amLODipine -benazepril  (LOTREL) 5-20 MG capsule [Pharmacy Med Name: AMLODIPINE -BENAZEPRIL  5-20 MG] 90 capsule 0    Sig: TAKE 1 CAPSULE BY MOUTH EVERY DAY     Cardiovascular: CCB + ACEI Combos Failed - 01/03/2024 11:48 AM      Failed - Cr in normal range and within 180 days    Creatinine  Date Value Ref Range Status  01/13/2014 1.19 0.60 - 1.30 mg/dL Final   Creatinine, Ser  Date Value Ref Range Status  02/09/2023 1.09 (H) 0.57 - 1.00 mg/dL Final         Failed - K in normal range and within 180 days    Potassium  Date Value Ref Range Status  02/09/2023 4.5 3.5 - 5.2 mmol/L Final  01/13/2014 3.4 (L) 3.5 - 5.1 mmol/L Final         Failed - Na in normal range and within 180 days    Sodium  Date Value Ref Range Status  02/09/2023 139 134 - 144 mmol/L Final  01/13/2014 135 (L) 136 - 145 mmol/L Final         Failed - eGFR is 30 or above and within 180 days    EGFR (African American)  Date Value Ref Range Status  01/13/2014 >60  Final   GFR calc Af Amer  Date Value Ref Range Status  07/10/2020 61 >59 mL/min/1.73 Final    Comment:    **In accordance with recommendations from the NKF-ASN Task force,**   [facility] is in the process of updating its eGFR calculation to the   2021 CKD-EPI creatinine equation that estimates kidney function   without a race variable.    EGFR (Non-African Amer.)  Date Value Ref Range Status  01/13/2014 53 (L)  Final    Comment:    eGFR values <6mL/min/1.73 m2 may be an indication of chronic kidney disease (CKD). Calculated eGFR is useful in patients with stable renal function. The eGFR calculation will not be reliable in acutely ill patients when serum creatinine is changing rapidly. It is not useful in  patients on dialysis. The eGFR calculation may not be applicable to patients at the low and high extremes of body sizes, pregnant women, and vegetarians.    GFR calc non Af Amer   Date Value Ref Range Status  07/10/2020 53 (L) >59 mL/min/1.73 Final   eGFR  Date Value Ref Range Status  02/09/2023 58 (L) >59 mL/min/1.73 Final         Failed - Valid encounter within last 6 months    Recent Outpatient Visits   None     Future Appointments             In 1 month Gala Jubilee, Chales [place], MD [facility] at [facility], [facility]            Passed - Patient is not pregnant      Passed - Last BP in normal range    BP Readings from Last 1 Encounters:  07/09/23 134/80          hydrochlorothiazide  (HYDRODIURIL ) 25 MG tablet [Pharmacy Med Name: HYDROCHLOROTHIAZIDE  25 MG TAB] 90 tablet 0    Sig: TAKE 1 TABLET (25 MG TOTAL) BY MOUTH DAILY.     Cardiovascular: Diuretics - Thiazide Failed - 01/03/2024 11:48 AM      Failed - Cr in normal range and within 180 days    Creatinine  Date Value Ref Range  Status  01/13/2014 1.19 0.60 - 1.30 mg/dL Final   Creatinine, Ser  Date Value Ref Range Status  02/09/2023 1.09 (H) 0.57 - 1.00 mg/dL Final         Failed - K in normal range and within 180 days    Potassium  Date Value Ref Range Status  02/09/2023 4.5 3.5 - 5.2 mmol/L Final  01/13/2014 3.4 (L) 3.5 - 5.1 mmol/L Final         Failed - Na in normal range and within 180 days    Sodium  Date Value Ref Range Status  02/09/2023 139 134 - 144 mmol/L Final  01/13/2014 135 (L) 136 - 145 mmol/L Final         Failed - Valid encounter within last 6 months    Recent Outpatient Visits   None     Future Appointments             In 1 month Gala Jubilee, Chales [place], MD [facility] Medicine at [facility], [facility]            Passed - Last BP in normal range    BP Readings from Last 1 Encounters:  07/09/23 134/80

## 2024-01-22 ENCOUNTER — Other Ambulatory Visit: Payer: Self-pay | Admitting: Internal Medicine

## 2024-01-22 DIAGNOSIS — E119 Type 2 diabetes mellitus without complications: Secondary | ICD-10-CM

## 2024-01-25 NOTE — Telephone Encounter (Signed)
 Requested Prescriptions  Pending Prescriptions Disp Refills   Semaglutide , 1 MG/DOSE, (OZEMPIC , 1 MG/DOSE,) 4 MG/3ML SOPN [Pharmacy Med Name: OZEMPIC  4 MG/3 ML (1 MG/DOSE)] 3 mL 0    Sig: INJECT 1 MG INTO THE SKIN ONE TIME PER WEEK     Endocrinology:  Diabetes - GLP-1 Receptor Agonists - semaglutide  Failed - 01/25/2024  8:14 AM      Failed - HBA1C in normal range and within 180 days    Hemoglobin A1C  Date Value Ref Range Status  06/14/2023 6.5 (A) 4.0 - 5.6 % Final  05/31/2017 7.8  Final   Hgb A1c MFr Bld  Date Value Ref Range Status  02/09/2023 7.5 (H) 4.8 - 5.6 % Final    Comment:             Prediabetes: 5.7 - 6.4          Diabetes: >6.4          Glycemic control for adults with diabetes: <7.0          Failed - Cr in normal range and within 360 days    Creatinine  Date Value Ref Range Status  01/13/2014 1.19 0.60 - 1.30 mg/dL Final   Creatinine, Ser  Date Value Ref Range Status  02/09/2023 1.09 (H) 0.57 - 1.00 mg/dL Final         Failed - Valid encounter within last 6 months    Recent Outpatient Visits   None     Future Appointments             In 2 weeks Gala Jubilee, Chales [place], MD [facility] at [facility], [facility]

## 2024-01-26 ENCOUNTER — Other Ambulatory Visit: Payer: Self-pay | Admitting: Internal Medicine

## 2024-01-26 DIAGNOSIS — G2581 Restless legs syndrome: Secondary | ICD-10-CM

## 2024-01-27 NOTE — Telephone Encounter (Signed)
 Requested Prescriptions  Pending Prescriptions Disp Refills   rOPINIRole  (REQUIP ) 0.5 MG tablet [Pharmacy Med Name: ROPINIROLE  HCL 0.5 MG TABLET] 90 tablet 1    Sig: TAKE 1 TABLET BY MOUTH EVERYDAY AT BEDTIME     Neurology:  Parkinsonian Agents Failed - 01/27/2024  1:39 PM      Failed - Valid encounter within last 12 months    Recent Outpatient Visits   None     Future Appointments             In 2 weeks Sheron Dixons, MD [facility] at [facility], [facility]            Passed - Last BP in normal range    BP Readings from Last 1 Encounters:  07/09/23 134/80         Passed - Last Heart Rate in normal range    Pulse Readings from Last 1 Encounters:  07/09/23 100

## 2024-02-11 ENCOUNTER — Encounter: Payer: Self-pay | Admitting: Internal Medicine

## 2024-02-11 NOTE — Progress Notes (Deleted)
 Date:  02/11/2024   Name:  Joy Roberts   DOB:  05-15-1961   MRN:  [id number]   Chief Complaint: No chief complaint on file. Joy Roberts is a 63 y.o. female who presents today for her Complete Annual Exam. She feels {DESC; WELL/FAIRLY WELL/POORLY:18703}. She reports exercising ***. She reports she is sleeping {DESC; WELL/FAIRLY WELL/POORLY:18703}. Breast complaints ***.  Health Maintenance  Topic Date Due   HIV Screening  Never done   Colon Cancer Screening  Never done   Mammogram  Never done   Zoster (Shingles) Vaccine (1 of 2) Never done   Pap with HPV screening  01/12/2014   Pneumococcal Vaccination (2 of 2 - PCV) 03/01/2014   DTaP/Tdap/Td vaccine (2 - Td or Tdap) 05/10/2022   COVID-19 Vaccine (4 - 2024-25 season) 05/16/2023   Hemoglobin A1C  12/12/2023   Yearly kidney function blood test for diabetes  02/09/2024   Complete foot exam   02/09/2024   Eye exam for diabetics  03/16/2024   Flu Shot  04/14/2024   Yearly kidney health urinalysis for diabetes  06/13/2024   Hepatitis C Screening  Completed   HPV Vaccine  Aged Out   Meningitis B Vaccine  Aged Out    Hypertension This is a chronic problem. The problem is controlled. Pertinent negatives include no chest pain, headaches, palpitations or shortness of breath. Past treatments include calcium  channel blockers, ACE inhibitors and diuretics. The current treatment provides significant improvement. Hypertensive end-organ damage includes kidney disease. There is no history of CAD/MI or CVA.  Diabetes She presents for her follow-up diabetic visit. She has type 2 diabetes mellitus. Pertinent negatives for hypoglycemia include no headaches or tremors. Pertinent negatives for diabetes include no chest pain, no fatigue, no polydipsia and no polyuria. Pertinent negatives for diabetic complications include no CVA. Current diabetic treatments: Ozempic , MTF and glipizide .  Hyperlipidemia This is a chronic problem. Pertinent  negatives include no chest pain or shortness of breath. Current antihyperlipidemic treatment includes statins.    Review of Systems  Constitutional:  Negative for appetite change, fatigue, fever and unexpected weight change.  HENT:  Negative for tinnitus and trouble swallowing.   Eyes:  Negative for visual disturbance.  Respiratory:  Negative for cough, chest tightness and shortness of breath.   Cardiovascular:  Negative for chest pain, palpitations and leg swelling.  Gastrointestinal:  Negative for abdominal pain.  Endocrine: Negative for polydipsia and polyuria.  Genitourinary:  Negative for dysuria and hematuria.  Musculoskeletal:  Negative for arthralgias.  Neurological:  Negative for tremors, numbness and headaches.  Psychiatric/Behavioral:  Negative for dysphoric mood.      Lab Results  Component Value Date   NA 139 02/09/2023   K 4.5 02/09/2023   CO2 25 02/09/2023   GLUCOSE 181 (H) 02/09/2023   BUN 15 02/09/2023   CREATININE 1.09 (H) 02/09/2023   CALCIUM  9.5 02/09/2023   EGFR 58 (L) 02/09/2023   GFRNONAA 53 (L) 07/10/2020   Lab Results  Component Value Date   CHOL 133 02/09/2023   HDL 41 02/09/2023   LDLCALC 71 02/09/2023   TRIG 118 02/09/2023   CHOLHDL 3.2 02/09/2023   Lab Results  Component Value Date   TSH 3.030 02/09/2023   Lab Results  Component Value Date   HGBA1C 6.5 (A) 06/14/2023   Lab Results  Component Value Date   WBC 6.8 02/09/2023   HGB 12.8 02/09/2023   HCT 39.3 02/09/2023   MCV 88 02/09/2023   PLT  354 02/09/2023   Lab Results  Component Value Date   ALT 24 02/09/2023   AST 17 02/09/2023   ALKPHOS 97 02/09/2023   BILITOT 0.3 02/09/2023   No results found for: "25OHVITD2", "25OHVITD3", "VD25OH"   Patient Active Problem List   Diagnosis Date Noted   Primary osteoarthritis of right knee 07/09/2023   Primary osteoarthritis of left knee 07/09/2023   Restless leg syndrome 06/14/2023   Lipoma of left upper extremity 07/10/2020    Pseudophakia of left eye 05/21/2020   Pseudophakia of right eye 57/84/6962   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   Mild intermittent asthma without complication 09/26/2017   Calculus of kidney 09/26/2017   BMI 45.0-49.9, adult (HCC) 09/26/2017   Osteoarthritis of multiple joints 03/31/2016   Diabetes mellitus treated with oral medication (HCC) 03/31/2016   Essential hypertension 09/10/2014   Varicose veins of both lower extremities 03/27/2014   Umbilical hernia 09/23/2013   Hyperlipidemia associated with type 2 diabetes mellitus (HCC) 05/10/2012    Allergies  Allergen Reactions   Codeine Nausea And Vomiting   Sulfa Antibiotics Nausea And Vomiting    Past Surgical History:  Procedure Laterality Date   CESAREAN SECTION  2003   LITHOTRIPSY  01/11/2014    Social History   Tobacco Use   Smoking status: Never   Smokeless tobacco: Never  Vaping Use   Vaping status: Never Used  Substance Use Topics   Alcohol use: No    Alcohol/week: 0.0 standard drinks of alcohol   Drug use: No     Medication list has been reviewed and updated.  No outpatient medications have been marked as taking for the 02/11/24 encounter (Appointment) with Sheron Dixons, MD.       02/09/2023    9:26 AM 10/05/2022    9:06 AM 05/28/2022    9:39 AM 01/23/2022   10:52 AM  GAD 7 : Generalized Anxiety Score  Nervous, Anxious, on Edge 0 0 0 0  Control/stop worrying 0 0 0 0  Worry too much - different things 0 0 0 0  Trouble relaxing 2 0 0 0  Restless 0 0 0 0  Easily annoyed or irritable 0 0 0 0  Afraid - awful might happen 0 0 0 0  Total GAD 7 Score 2 0 0 0  Anxiety Difficulty Not difficult at all Not difficult at all Not difficult at all Not difficult at all       02/09/2023    9:25 AM 10/05/2022    9:05 AM 05/28/2022    9:38 AM  Depression screen PHQ 2/9  Decreased Interest 2 2 1   Down, Depressed, Hopeless 0 0 0  PHQ - 2 Score 2 2 1   Altered sleeping 3 3 1   Tired, decreased energy 3 2 1   Change  in appetite 0 0 0  Feeling bad or failure about yourself  0 0 0  Trouble concentrating 1 0 0  Moving slowly or fidgety/restless 0 3 1  Suicidal thoughts 0 0 0  PHQ-9 Score 9 10 4   Difficult doing work/chores Somewhat difficult Somewhat difficult Somewhat difficult    BP Readings from Last 3 Encounters:  07/09/23 134/80  06/14/23 124/78  02/09/23 118/68    Physical Exam Vitals and nursing note reviewed.  Constitutional:      General: She is not in acute distress.    Appearance: She is well-developed.  HENT:     Head: Normocephalic and atraumatic.     Right Ear: Tympanic membrane and  ear canal normal.     Left Ear: Tympanic membrane and ear canal normal.     Nose:     Right Sinus: No maxillary sinus tenderness.     Left Sinus: No maxillary sinus tenderness.  Eyes:     General: No scleral icterus.       Right eye: No discharge.        Left eye: No discharge.     Conjunctiva/sclera: Conjunctivae normal.  Neck:     Thyroid: No thyromegaly.     Vascular: No carotid bruit.  Cardiovascular:     Rate and Rhythm: Normal rate and regular rhythm.     Pulses: Normal pulses.     Heart sounds: Normal heart sounds.  Pulmonary:     Effort: Pulmonary effort is normal. No respiratory distress.     Breath sounds: No wheezing.  Abdominal:     General: Bowel sounds are normal.     Palpations: Abdomen is soft.     Tenderness: There is no abdominal tenderness.  Musculoskeletal:     Cervical back: Normal range of motion. No erythema.     Right lower leg: No edema.     Left lower leg: No edema.  Lymphadenopathy:     Cervical: No cervical adenopathy.  Skin:    General: Skin is warm and dry.     Findings: No rash.  Neurological:     Mental Status: She is alert and oriented to person, place, and time.     Cranial Nerves: No cranial nerve deficit.     Sensory: No sensory deficit.     Deep Tendon Reflexes: Reflexes are normal and symmetric.  Psychiatric:        Attention and Perception:  Attention normal.        Mood and Affect: Mood normal.    Diabetic Foot Exam - Simple   No data filed      Wt Readings from Last 3 Encounters:  07/09/23 283 lb (128.4 kg)  06/14/23 283 lb (128.4 kg)  02/09/23 286 lb (129.7 kg)    There were no vitals taken for this visit.  Assessment and Plan:  Problem List Items Addressed This Visit       Unprioritized   Hyperlipidemia associated with type 2 diabetes mellitus (HCC) (Chronic)   Essential hypertension (Chronic)   Diabetes mellitus treated with oral medication (HCC)   Other Visit Diagnoses       Annual physical exam    -  Primary     Encounter for screening mammogram for breast cancer         Colon cancer screening         Encounter for screening for cervical cancer           No follow-ups on file.    Sheron Dixons, MD [facility] [place]

## 2024-02-25 ENCOUNTER — Encounter: Payer: Self-pay | Admitting: Internal Medicine

## 2024-02-25 ENCOUNTER — Other Ambulatory Visit: Payer: Self-pay | Admitting: Internal Medicine

## 2024-02-25 ENCOUNTER — Ambulatory Visit (INDEPENDENT_AMBULATORY_CARE_PROVIDER_SITE_OTHER): Admitting: Internal Medicine

## 2024-02-25 VITALS — BP 124/76 | HR 78 | Ht 65.0 in | Wt 281.1 lb

## 2024-02-25 DIAGNOSIS — E119 Type 2 diabetes mellitus without complications: Secondary | ICD-10-CM

## 2024-02-25 DIAGNOSIS — I10 Essential (primary) hypertension: Secondary | ICD-10-CM

## 2024-02-25 DIAGNOSIS — Z0000 Encounter for general adult medical examination without abnormal findings: Secondary | ICD-10-CM | POA: Diagnosis not present

## 2024-02-25 DIAGNOSIS — M170 Bilateral primary osteoarthritis of knee: Secondary | ICD-10-CM

## 2024-02-25 DIAGNOSIS — G2581 Restless legs syndrome: Secondary | ICD-10-CM

## 2024-02-25 DIAGNOSIS — Z7984 Long term (current) use of oral hypoglycemic drugs: Secondary | ICD-10-CM

## 2024-02-25 DIAGNOSIS — E1169 Type 2 diabetes mellitus with other specified complication: Secondary | ICD-10-CM

## 2024-02-25 DIAGNOSIS — E785 Hyperlipidemia, unspecified: Secondary | ICD-10-CM

## 2024-02-25 DIAGNOSIS — J4520 Mild intermittent asthma, uncomplicated: Secondary | ICD-10-CM

## 2024-02-25 MED ORDER — SEMAGLUTIDE (2 MG/DOSE) 8 MG/3ML ~~LOC~~ SOPN: 2.0000 mg | PEN_INJECTOR | SUBCUTANEOUS | 3 refills | Status: DC

## 2024-02-25 MED ORDER — METFORMIN HCL ER 500 MG PO TB24: 1000.0000 mg | ORAL_TABLET | Freq: Two times a day (BID) | ORAL | 3 refills | Status: AC

## 2024-02-25 MED ORDER — HYDROCHLOROTHIAZIDE 25 MG PO TABS
25.0000 mg | ORAL_TABLET | Freq: Every day | ORAL | 3 refills | Status: AC
Start: 2024-02-25 — End: ?

## 2024-02-25 MED ORDER — GLIPIZIDE ER 2.5 MG PO TB24
2.5000 mg | ORAL_TABLET | Freq: Every day | ORAL | 3 refills | Status: AC
Start: 2024-02-25 — End: ?

## 2024-02-25 MED ORDER — ATORVASTATIN CALCIUM 40 MG PO TABS: 40.0000 mg | ORAL_TABLET | Freq: Every day | ORAL | 3 refills | Status: AC

## 2024-02-25 NOTE — Assessment & Plan Note (Signed)
 LDL is  Lab Results  Component Value Date   LDLCALC 71 02/09/2023   Current regimen is atorvastatin .  No medication side effects noted. Goal LDL is <70.

## 2024-02-25 NOTE — Assessment & Plan Note (Signed)
 Asthma is well controlled. Continue Advair and prn nebulizer albuterol 

## 2024-02-25 NOTE — Assessment & Plan Note (Addendum)
 Blood sugars have been stable.  No recent hypoglycemic events requiring assistance. Currently medications are Ozempic , MTF and glipizide . Lab Results  Component Value Date   HGBA1C 6.5 (A) 06/14/2023   Last visit no changes were made. Will increase Ozempic  to 2 mg weekly to aid with weight loss.

## 2024-02-25 NOTE — Assessment & Plan Note (Signed)
 Recently seen by Dr. Aubry Blase - will need TKA in the future but needs to lose 20 lbs. She takes Tylenol  and Advil daily. She uses a cane to ambulate.  She plans to start pool exercises with her husband. Handicapped parking placard application given.

## 2024-02-25 NOTE — Assessment & Plan Note (Signed)
 Blood pressure is well controlled.  Current medications are amlodipine , hydrochlorothiazide  and benazepril . Will continue same regimen along with efforts to limit dietary sodium.

## 2024-02-25 NOTE — Assessment & Plan Note (Signed)
 RLS symptoms controlled with Requip 

## 2024-02-25 NOTE — Progress Notes (Signed)
 Date:  02/25/2024   Name:  Joy Roberts   DOB:  [birth year]   MRN:  [id number]   Chief Complaint: Annual Exam Joy Roberts is a 63 y.o. female who presents today for her Complete Annual Exam. She feels fairly well. She reports exercising none. She reports she is sleeping poorly. Breast complaints none.  Health Maintenance  Topic Date Due   HIV Screening  Never done   Colon Cancer Screening  Never done   Mammogram  Never done   Zoster (Shingles) Vaccine (1 of 2) Never done   Pap with HPV screening  01/12/2014   Pneumococcal Vaccination (2 of 2 - PCV) 03/01/2014   DTaP/Tdap/Td vaccine (2 - Td or Tdap) 05/10/2022   Hemoglobin A1C  12/12/2023   Yearly kidney function blood test for diabetes  02/09/2024   COVID-19 Vaccine (4 - 2024-25 season) 03/12/2025*   Eye exam for diabetics  03/16/2024   Flu Shot  04/14/2024   Yearly kidney health urinalysis for diabetes  06/13/2024   Complete foot exam   02/24/2025   Hepatitis C Screening  Completed   HPV Vaccine  Aged Out   Meningitis B Vaccine  Aged Out  *Topic was postponed. The date shown is not the original due date.    Asthma There is no cough, shortness of breath or wheezing. Pertinent negatives include no chest pain, headaches, myalgias or trouble swallowing. Her past medical history is significant for asthma.  Hypertension Pertinent negatives include no chest pain, headaches, palpitations or shortness of breath.  Diabetes Pertinent negatives for hypoglycemia include no dizziness, headaches or nervousness/anxiousness. Pertinent negatives for diabetes include no chest pain, no fatigue and no weakness.  Hyperlipidemia Pertinent negatives include no chest pain, myalgias or shortness of breath.  Knee Pain  There was no injury mechanism. The pain is present in the left knee and right knee. The quality of the pain is described as aching and shooting. The pain is severe. The pain has been Constant since onset. Associated symptoms  include an inability to bear weight.    Review of Systems  Constitutional:  Negative for fatigue and unexpected weight change.  HENT:  Negative for trouble swallowing.   Eyes:  Negative for visual disturbance.  Respiratory:  Negative for cough, chest tightness, shortness of breath and wheezing.   Cardiovascular:  Negative for chest pain, palpitations and leg swelling.  Gastrointestinal:  Negative for abdominal pain, constipation and diarrhea.  Genitourinary:  Negative for hematuria and urgency.  Musculoskeletal:  Positive for arthralgias and gait problem. Negative for myalgias.  Neurological:  Negative for dizziness, weakness, light-headedness and headaches.  Psychiatric/Behavioral:  Positive for dysphoric mood. Negative for sleep disturbance. The patient is not nervous/anxious.      Lab Results  Component Value Date   NA 139 02/09/2023   K 4.5 02/09/2023   CO2 25 02/09/2023   GLUCOSE 181 (H) 02/09/2023   BUN 15 02/09/2023   CREATININE 1.09 (H) 02/09/2023   CALCIUM  9.5 02/09/2023   EGFR 58 (L) 02/09/2023   GFRNONAA 53 (L) 07/10/2020   Lab Results  Component Value Date   CHOL 133 02/09/2023   HDL 41 02/09/2023   LDLCALC 71 02/09/2023   TRIG 118 02/09/2023   CHOLHDL 3.2 02/09/2023   Lab Results  Component Value Date   TSH 3.030 02/09/2023   Lab Results  Component Value Date   HGBA1C 6.5 (A) 06/14/2023   Lab Results  Component Value Date   WBC 6.8 02/09/2023  HGB 12.8 02/09/2023   HCT 39.3 02/09/2023   MCV 88 02/09/2023   PLT 354 02/09/2023   Lab Results  Component Value Date   ALT 24 02/09/2023   AST 17 02/09/2023   ALKPHOS 97 02/09/2023   BILITOT 0.3 02/09/2023   No results found for: Lucetta Russel, VD25OH   Patient Active Problem List   Diagnosis Date Noted   Restless leg syndrome 06/14/2023   Lipoma of left upper extremity 07/10/2020   Pseudophakia of left eye 05/21/2020   Pseudophakia of right eye 16/06/9603   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   Mild intermittent asthma without complication 09/26/2017   Calculus of kidney 09/26/2017   BMI 45.0-49.9, adult (HCC) 09/26/2017   Primary osteoarthritis of both knees 03/31/2016   Diabetes mellitus treated with oral medication (HCC) 03/31/2016   Essential hypertension 09/10/2014   Varicose veins of both lower extremities 03/27/2014   Umbilical hernia 09/23/2013   Hyperlipidemia associated with type 2 diabetes mellitus (HCC) 05/10/2012    Allergies  Allergen Reactions   Codeine Nausea And Vomiting   Sulfa Antibiotics Nausea And Vomiting, Nausea Only and Other (See Comments)    Past Surgical History:  Procedure Laterality Date   CESAREAN SECTION  2003   LITHOTRIPSY  01/11/2014    Social History   Tobacco Use   Smoking status: Never   Smokeless tobacco: Never  Vaping Use   Vaping status: Never Used  Substance Use Topics   Alcohol use: No    Alcohol/week: 0.0 standard drinks of alcohol   Drug use: No     Medication list has been reviewed and updated.  Current Meds  Medication Sig   albuterol  (ACCUNEB ) 1.25 MG/3ML nebulizer solution Take 3 mLs (1.25 mg total) by nebulization every 6 (six) hours as needed for wheezing.   albuterol  (VENTOLIN  HFA) 108 (90 Base) MCG/ACT inhaler    amLODipine -benazepril  (LOTREL) 5-20 MG capsule TAKE 1 CAPSULE BY MOUTH EVERY DAY   aspirin EC 81 MG tablet Take by mouth.   clotrimazole -betamethasone  (LOTRISONE ) cream Apply 1 Application topically daily.   fluticasone  (FLONASE ) 50 MCG/ACT nasal spray Place 2 sprays into both nostrils daily.   fluticasone -salmeterol (ADVAIR DISKUS) 250-50 MCG/ACT AEPB Inhale 1 puff into the lungs in the morning and at bedtime.   IBUPROFEN PO Take by mouth as needed.   Insulin Pen Needle (PEN NEEDLES) 32G X 5 MM MISC 1 each by Does not apply route once a week.   rOPINIRole  (REQUIP ) 0.5 MG tablet TAKE 1 TABLET BY MOUTH EVERYDAY AT BEDTIME   Semaglutide , 2 MG/DOSE, 8 MG/3ML SOPN Inject 2 mg as directed  once a week.   Spacer/Aero-Holding Chambers (AEROCHAMBER MV) inhaler Use as instructed   [DISCONTINUED] atorvastatin  (LIPITOR) 40 MG tablet TAKE 1 TABLET BY MOUTH EVERY DAY   [DISCONTINUED] glipiZIDE  (GLUCOTROL  XL) 2.5 MG 24 hr tablet TAKE 1 TABLET BY MOUTH EVERY DAY   [DISCONTINUED] hydrochlorothiazide  (HYDRODIURIL ) 25 MG tablet TAKE 1 TABLET (25 MG TOTAL) BY MOUTH DAILY.   [DISCONTINUED] metFORMIN  (GLUCOPHAGE -XR) 500 MG 24 hr tablet TAKE 2 TABLETS BY MOUTH TWICE A DAY   [DISCONTINUED] Semaglutide , 1 MG/DOSE, (OZEMPIC , 1 MG/DOSE,) 4 MG/3ML SOPN INJECT 1 MG INTO THE SKIN ONE TIME PER WEEK       02/25/2024   11:07 AM 02/09/2023    9:26 AM 10/05/2022    9:06 AM 05/28/2022    9:39 AM  GAD 7 : Generalized Anxiety Score  Nervous, Anxious, on Edge 0 0 0 0  Control/stop worrying  0 0 0 0  Worry too much - different things 0 0 0 0  Trouble relaxing 3 2 0 0  Restless 0 0 0 0  Easily annoyed or irritable 0 0 0 0  Afraid - awful might happen 0 0 0 0  Total GAD 7 Score 3 2 0 0  Anxiety Difficulty Somewhat difficult Not difficult at all Not difficult at all Not difficult at all       02/25/2024   11:07 AM 02/09/2023    9:25 AM 10/05/2022    9:05 AM  Depression screen PHQ 2/9  Decreased Interest 2 2 2   Down, Depressed, Hopeless 0 0 0  PHQ - 2 Score 2 2 2   Altered sleeping 3 3 3   Tired, decreased energy 2 3 2   Change in appetite 0 0 0  Feeling bad or failure about yourself  0 0 0  Trouble concentrating 0 1 0  Moving slowly or fidgety/restless 3 0 3  Suicidal thoughts 0 0 0  PHQ-9 Score 10 9 10   Difficult doing work/chores Somewhat difficult Somewhat difficult Somewhat difficult    BP Readings from Last 3 Encounters:  02/25/24 124/76  07/09/23 134/80  06/14/23 124/78    Physical Exam Vitals and nursing note reviewed.  Constitutional:      General: She is not in acute distress.    Appearance: She is well-developed.  HENT:     Head: Normocephalic and atraumatic.     Right Ear:  Tympanic membrane and ear canal normal.     Left Ear: Tympanic membrane and ear canal normal.     Nose:     Right Sinus: No maxillary sinus tenderness.     Left Sinus: No maxillary sinus tenderness.   Eyes:     General: No scleral icterus.       Right eye: No discharge.        Left eye: No discharge.     Conjunctiva/sclera: Conjunctivae normal.   Neck:     Thyroid: No thyromegaly.     Vascular: No carotid bruit.   Cardiovascular:     Rate and Rhythm: Normal rate and regular rhythm.     Pulses: Normal pulses.     Heart sounds: Normal heart sounds.  Pulmonary:     Effort: Pulmonary effort is normal. No respiratory distress.     Breath sounds: No wheezing.  Abdominal:     General: Bowel sounds are normal.     Palpations: Abdomen is soft.     Tenderness: There is no abdominal tenderness.   Musculoskeletal:     Cervical back: Normal range of motion. No erythema.     Right knee: Bony tenderness present. Decreased range of motion.     Left knee: Bony tenderness present. Decreased range of motion.     Right lower leg: No edema.     Left lower leg: No edema.  Lymphadenopathy:     Cervical: No cervical adenopathy.   Skin:    General: Skin is warm and dry.     Findings: No rash.   Neurological:     Mental Status: She is alert and oriented to person, place, and time.     Cranial Nerves: No cranial nerve deficit.     Sensory: No sensory deficit.     Gait: Gait abnormal.     Deep Tendon Reflexes: Reflexes are normal and symmetric.   Psychiatric:        Attention and Perception: Attention normal.  Mood and Affect: Mood normal.    Diabetic Foot Exam - Simple   Simple Foot Form Diabetic Foot exam was performed with the following findings: Yes 02/25/2024 11:21 AM  Visual Inspection No deformities, no ulcerations, no other skin breakdown bilaterally: Yes Sensation Testing Intact to touch and monofilament testing bilaterally: Yes Pulse Check Posterior Tibialis and  Dorsalis pulse intact bilaterally: Yes Comments      Wt Readings from Last 3 Encounters:  02/25/24 281 lb 2 oz (127.5 kg)  07/09/23 283 lb (128.4 kg)  06/14/23 283 lb (128.4 kg)    BP 124/76   Pulse 78   Ht 5' 5 (1.651 m)   Wt 281 lb 2 oz (127.5 kg)   SpO2 97%   BMI 46.78 kg/m   Assessment and Plan:  Problem List Items Addressed This Visit       Unprioritized   Mild intermittent asthma without complication (Chronic)   Asthma is well controlled. Continue Advair and prn nebulizer albuterol       Diabetes mellitus treated with oral medication (HCC) (Chronic)   Blood sugars have been stable.  No recent hypoglycemic events requiring assistance. Currently medications are Ozempic , MTF and glipizide . Lab Results  Component Value Date   HGBA1C 6.5 (A) 06/14/2023   Last visit no changes were made. Will increase Ozempic  to 2 mg weekly to aid with weight loss.       Relevant Medications   Semaglutide , 2 MG/DOSE, 8 MG/3ML SOPN   atorvastatin  (LIPITOR) 40 MG tablet   glipiZIDE  (GLUCOTROL  XL) 2.5 MG 24 hr tablet   metFORMIN  (GLUCOPHAGE -XR) 500 MG 24 hr tablet   Other Relevant Orders   Comprehensive metabolic panel with GFR   Hemoglobin A1c   Microalbumin / creatinine urine ratio   TSH   Hyperlipidemia associated with type 2 diabetes mellitus (HCC) (Chronic)   LDL is  Lab Results  Component Value Date   LDLCALC 71 02/09/2023   Current regimen is atorvastatin .  No medication side effects noted. Goal LDL is <70.       Relevant Medications   Semaglutide , 2 MG/DOSE, 8 MG/3ML SOPN   atorvastatin  (LIPITOR) 40 MG tablet   hydrochlorothiazide  (HYDRODIURIL ) 25 MG tablet   glipiZIDE  (GLUCOTROL  XL) 2.5 MG 24 hr tablet   metFORMIN  (GLUCOPHAGE -XR) 500 MG 24 hr tablet   Other Relevant Orders   Lipid panel   Essential hypertension (Chronic)   Blood pressure is well controlled.  Current medications are amlodipine , hydrochlorothiazide  and benazepril . Will continue same  regimen along with efforts to limit dietary sodium.       Relevant Medications   atorvastatin  (LIPITOR) 40 MG tablet   hydrochlorothiazide  (HYDRODIURIL ) 25 MG tablet   Other Relevant Orders   CBC with Differential/Platelet   Comprehensive metabolic panel with GFR   Primary osteoarthritis of both knees   Recently seen by Dr. Aubry Blase - will need TKA in the future but needs to lose 20 lbs. She takes Tylenol  and Advil daily. She uses a cane to ambulate.  She plans to start pool exercises with her husband. Handicapped parking placard application given.      Relevant Medications   HYDROcodone -acetaminophen  (NORCO/VICODIN) 5-325 MG tablet   Restless leg syndrome   RLS symptoms controlled with Requip       Other Visit Diagnoses       Annual physical exam    -  Primary   she continues to decline Pap, mammogram and CRC screening       Return in about 4  months (around 06/26/2024) for DM, HTN.    Sheron Dixons, MD [facility] [place]

## 2024-02-27 LAB — CBC WITH DIFFERENTIAL/PLATELET
Basophils Absolute: 0 10*3/uL (ref 0.0–0.2)
Basos: 1 %
EOS (ABSOLUTE): 0.3 10*3/uL (ref 0.0–0.4)
Eos: 4 %
Hematocrit: 38.8 % (ref 34.0–46.6)
Hemoglobin: 12.6 g/dL (ref 11.1–15.9)
Immature Grans (Abs): 0 10*3/uL (ref 0.0–0.1)
Immature Granulocytes: 0 %
Lymphocytes Absolute: 2 10*3/uL (ref 0.7–3.1)
Lymphs: 26 %
MCH: 28.6 pg (ref 26.6–33.0)
MCHC: 32.5 g/dL (ref 31.5–35.7)
MCV: 88 fL (ref 79–97)
Monocytes Absolute: 0.8 10*3/uL (ref 0.1–0.9)
Monocytes: 10 %
Neutrophils Absolute: 4.7 10*3/uL (ref 1.4–7.0)
Neutrophils: 59 %
Platelets: 406 10*3/uL (ref 150–450)
RBC: 4.41 x10E6/uL (ref 3.77–5.28)
RDW: 12.6 % (ref 11.7–15.4)
WBC: 7.9 10*3/uL (ref 3.4–10.8)

## 2024-02-27 LAB — MICROALBUMIN / CREATININE URINE RATIO
Creatinine, Urine: 156.1 mg/dL
Microalb/Creat Ratio: 134 mg/g{creat} — ABNORMAL HIGH (ref 0–29)
Microalbumin, Urine: 209 ug/mL

## 2024-02-27 LAB — COMPREHENSIVE METABOLIC PANEL WITH GFR
ALT: 17 IU/L (ref 0–32)
AST: 19 IU/L (ref 0–40)
Albumin: 4.4 g/dL (ref 3.9–4.9)
Alkaline Phosphatase: 83 IU/L (ref 44–121)
BUN/Creatinine Ratio: 15 (ref 12–28)
BUN: 18 mg/dL (ref 8–27)
Bilirubin Total: 0.2 mg/dL (ref 0.0–1.2)
CO2: 23 mmol/L (ref 20–29)
Calcium: 10 mg/dL (ref 8.7–10.3)
Chloride: 100 mmol/L (ref 96–106)
Creatinine, Ser: 1.18 mg/dL — ABNORMAL HIGH (ref 0.57–1.00)
Globulin, Total: 2.8 g/dL (ref 1.5–4.5)
Glucose: 96 mg/dL (ref 70–99)
Potassium: 4.5 mmol/L (ref 3.5–5.2)
Sodium: 140 mmol/L (ref 134–144)
Total Protein: 7.2 g/dL (ref 6.0–8.5)
eGFR: 53 mL/min/{1.73_m2} — ABNORMAL LOW (ref >59)

## 2024-02-27 LAB — HEMOGLOBIN A1C
Est. average glucose Bld gHb Est-mCnc: 140 mg/dL
Hgb A1c MFr Bld: 6.5 % — ABNORMAL HIGH (ref 4.8–5.6)

## 2024-02-27 LAB — LIPID PANEL
Chol/HDL Ratio: 3.5 ratio (ref 0.0–4.4)
Cholesterol, Total: 122 mg/dL (ref 100–199)
HDL: 35 mg/dL — ABNORMAL LOW (ref >39)
LDL Chol Calc (NIH): 59 mg/dL (ref 0–99)
Triglycerides: 163 mg/dL — ABNORMAL HIGH (ref 0–149)
VLDL Cholesterol Cal: 28 mg/dL (ref 5–40)

## 2024-02-27 LAB — TSH: TSH: 1.93 u[IU]/mL (ref 0.450–4.500)

## 2024-02-28 ENCOUNTER — Ambulatory Visit: Payer: Self-pay | Admitting: Internal Medicine

## 2024-02-28 NOTE — Telephone Encounter (Signed)
 The original prescription was discontinued on 02/25/2024 by Sheron Dixons, MD for the following reason: Dose change.   Requested Prescriptions  Refused Prescriptions Disp Refills   OZEMPIC , 1 MG/DOSE, 4 MG/3ML SOPN [Pharmacy Med Name: OZEMPIC  4 MG/3 ML (1 MG/DOSE)]      Sig: INJECT 1 MG INTO THE SKIN ONE TIME PER WEEK     Endocrinology:  Diabetes - GLP-1 Receptor Agonists - semaglutide  Failed - 02/28/2024 11:13 AM      Failed - HBA1C in normal range and within 180 days    Hemoglobin A1C  Date Value Ref Range Status  05/31/2017 7.8  Final   Hgb A1c MFr Bld  Date Value Ref Range Status  02/25/2024 6.5 (H) 4.8 - 5.6 % Final    Comment:             Prediabetes: 5.7 - 6.4          Diabetes: >6.4          Glycemic control for adults with diabetes: <7.0          Failed - Cr in normal range and within 360 days    Creatinine  Date Value Ref Range Status  01/13/2014 1.19 0.60 - 1.30 mg/dL Final   Creatinine, Ser  Date Value Ref Range Status  02/25/2024 1.18 (H) 0.57 - 1.00 mg/dL Final         Failed - Valid encounter within last 6 months    Recent Outpatient Visits           3 days ago Annual physical exam   [facility] at [facility], Chales [place], MD       Future Appointments             In 4 months Gala Jubilee [place]ales [place], MD [facility] Primary Care & [facility] Medicine at [facility], [facility]

## 2024-04-01 ENCOUNTER — Other Ambulatory Visit: Payer: Self-pay | Admitting: Internal Medicine

## 2024-04-01 DIAGNOSIS — I10 Essential (primary) hypertension: Secondary | ICD-10-CM

## 2024-04-04 NOTE — Telephone Encounter (Signed)
 Requested Prescriptions  Pending Prescriptions Disp Refills   amLODipine -benazepril  (LOTREL) 5-20 MG capsule [Pharmacy Med Name: AMLODIPINE -BENAZEPRIL  5-20 MG] 90 capsule 1    Sig: TAKE 1 CAPSULE BY MOUTH EVERY DAY     Cardiovascular: CCB + ACEI Combos Failed - 04/04/2024  9:16 AM      Failed - Cr in normal range and within 180 days    Creatinine  Date Value Ref Range Status  01/13/2014 1.19 0.60 - 1.30 mg/dL Final   Creatinine, Ser  Date Value Ref Range Status  02/25/2024 1.18 (H) 0.57 - 1.00 mg/dL Final         Passed - K in normal range and within 180 days    Potassium  Date Value Ref Range Status  02/25/2024 4.5 3.5 - 5.2 mmol/L Final  01/13/2014 3.4 (L) 3.5 - 5.1 mmol/L Final         Passed - Na in normal range and within 180 days    Sodium  Date Value Ref Range Status  02/25/2024 140 134 - 144 mmol/L Final  01/13/2014 135 (L) 136 - 145 mmol/L Final         Passed - eGFR is 30 or above and within 180 days    EGFR (African American)  Date Value Ref Range Status  01/13/2014 >60  Final   GFR calc Af Amer  Date Value Ref Range Status  07/10/2020 61 >59 mL/min/1.73 Final    Comment:    **In accordance with recommendations from the NKF-ASN Task force,**   [facility] is in the process of updating its eGFR calculation to the   2021 CKD-EPI creatinine equation that estimates kidney function   without a race variable.    EGFR (Non-African Amer.)  Date Value Ref Range Status  01/13/2014 53 (L)  Final    Comment:    eGFR values <68mL/min/1.73 m2 may be an indication of chronic kidney disease (CKD). Calculated eGFR is useful in patients with stable renal function. The eGFR calculation will not be reliable in acutely ill patients when serum creatinine is changing rapidly. It is not useful in  patients on dialysis. The eGFR calculation may not be applicable to patients at the low and high extremes of body sizes, pregnant women, and vegetarians.    GFR calc non Af Amer   Date Value Ref Range Status  07/10/2020 53 (L) >59 mL/min/1.73 Final   eGFR  Date Value Ref Range Status  02/25/2024 53 (L) >59 mL/min/1.73 Final         Passed - Patient is not pregnant      Passed - Last BP in normal range    BP Readings from Last 1 Encounters:  02/25/24 124/76         Passed - Valid encounter within last 6 months    Recent Outpatient Visits           1 month ago Annual physical exam   [facility] Primary Care & Sports Medicine at [facility], Leita DEL, MD       Future Appointments             In 2 months Justus, Leita DEL, MD [facility] Health Primary Care & Sports Medicine at [facility], [facility]

## 2024-04-14 ENCOUNTER — Encounter: Payer: Self-pay | Admitting: Internal Medicine

## 2024-05-31 ENCOUNTER — Telehealth: Payer: Self-pay

## 2024-05-31 ENCOUNTER — Other Ambulatory Visit: Payer: Self-pay | Admitting: Internal Medicine

## 2024-05-31 ENCOUNTER — Telehealth: Payer: Self-pay | Admitting: Internal Medicine

## 2024-05-31 ENCOUNTER — Other Ambulatory Visit: Payer: Self-pay

## 2024-05-31 DIAGNOSIS — J4520 Mild intermittent asthma, uncomplicated: Secondary | ICD-10-CM

## 2024-05-31 MED ORDER — ALBUTEROL SULFATE HFA 108 (90 BASE) MCG/ACT IN AERS: 2.0000 | INHALATION_SPRAY | RESPIRATORY_TRACT | 1 refills | Status: AC | PRN

## 2024-05-31 MED ORDER — FLUTICASONE-SALMETEROL 250-50 MCG/ACT IN AEPB
1.0000 | INHALATION_SPRAY | Freq: Two times a day (BID) | RESPIRATORY_TRACT | 5 refills | Status: AC
Start: 2024-05-31 — End: ?

## 2024-05-31 NOTE — Telephone Encounter (Unsigned)
 Copied from CRM [id number]. Topic: Clinical - Refused Triage >> May 31, 2024  9:45 AM Mia F wrote: Patient/caller voiced complaints of wheezing and out of her inhaler. She says she has head congestion and drainage which is triggering to her asthma. She states she has a cough through out the day and when she goes to bed it worsens. She put in a request for her albuterol  (VENTOLIN  HFA) 108 (90 Base) MCG/ACT inhaler Today and states if she does not get it soon her asthma can get worse. PT was advised to speak with NT but declined if it is not going ot be her doctors nurse. Declined transfer to triage.

## 2024-05-31 NOTE — Progress Notes (Unsigned)
 Date:  05/31/2024   Name:  Joy Roberts   DOB:  04-01-1961   MRN:  [id number]   Chief Complaint: No chief complaint on file.  HPI  Review of Systems   Lab Results  Component Value Date   NA 140 02/25/2024   K 4.5 02/25/2024   CO2 23 02/25/2024   GLUCOSE 96 02/25/2024   BUN 18 02/25/2024   CREATININE 1.18 (H) 02/25/2024   CALCIUM  10.0 02/25/2024   EGFR 53 (L) 02/25/2024   GFRNONAA 53 (L) 07/10/2020   Lab Results  Component Value Date   CHOL 122 02/25/2024   HDL 35 (L) 02/25/2024   LDLCALC 59 02/25/2024   TRIG 163 (H) 02/25/2024   CHOLHDL 3.5 02/25/2024   Lab Results  Component Value Date   TSH 1.930 02/25/2024   Lab Results  Component Value Date   HGBA1C 6.5 (H) 02/25/2024   Lab Results  Component Value Date   WBC 7.9 02/25/2024   HGB 12.6 02/25/2024   HCT 38.8 02/25/2024   MCV 88 02/25/2024   PLT 406 02/25/2024   Lab Results  Component Value Date   ALT 17 02/25/2024   AST 19 02/25/2024   ALKPHOS 83 02/25/2024   BILITOT 0.2 02/25/2024   No results found for: MARIEN BOLLS, VD25OH   Patient Active Problem List   Diagnosis Date Noted   Restless leg syndrome 06/14/2023   Lipoma of left upper extremity 07/10/2020   Pseudophakia of left eye 05/21/2020   Pseudophakia of right eye 91/68/7978   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   Mild intermittent asthma without complication 09/26/2017   Calculus of kidney 09/26/2017   BMI 45.0-49.9, adult (HCC) 09/26/2017   Primary osteoarthritis of both knees 03/31/2016   Diabetes mellitus treated with oral medication (HCC) 03/31/2016   Essential hypertension 09/10/2014   Varicose veins of both lower extremities 03/27/2014   Umbilical hernia 09/23/2013   Hyperlipidemia associated with type 2 diabetes mellitus (HCC) 05/10/2012    Allergies  Allergen Reactions   Codeine Nausea And Vomiting   Sulfa Antibiotics Nausea And Vomiting, Nausea Only and Other (See Comments)    Past Surgical History:   Procedure Laterality Date   CESAREAN SECTION  2003   LITHOTRIPSY  01/11/2014    Social History   Tobacco Use   Smoking status: Never   Smokeless tobacco: Never  Vaping Use   Vaping status: Never Used  Substance Use Topics   Alcohol use: No    Alcohol/week: 0.0 standard drinks of alcohol   Drug use: No     Medication list has been reviewed and updated.  No outpatient medications have been marked as taking for the 05/31/24 encounter (Orders Only) with Justus Leita DEL, MD.       02/25/2024   11:07 AM 02/09/2023    9:26 AM 10/05/2022    9:06 AM 05/28/2022    9:39 AM  GAD 7 : Generalized Anxiety Score  Nervous, Anxious, on Edge 0 0 0 0  Control/stop worrying 0 0 0 0  Worry too much - different things 0 0 0 0  Trouble relaxing 3 2 0 0  Restless 0 0 0 0  Easily annoyed or irritable 0 0 0 0  Afraid - awful might happen 0 0 0 0  Total GAD 7 Score 3 2 0 0  Anxiety Difficulty Somewhat difficult Not difficult at all Not difficult at all Not difficult at all       02/25/2024   11:07  AM 02/09/2023    9:25 AM 10/05/2022    9:05 AM  Depression screen PHQ 2/9  Decreased Interest 2 2 2   Down, Depressed, Hopeless 0 0 0  PHQ - 2 Score 2 2 2   Altered sleeping 3 3 3   Tired, decreased energy 2 3 2   Change in appetite 0 0 0  Feeling bad or failure about yourself  0 0 0  Trouble concentrating 0 1 0  Moving slowly or fidgety/restless 3 0 3  Suicidal thoughts 0 0 0  PHQ-9 Score 10 9 10   Difficult doing work/chores Somewhat difficult Somewhat difficult Somewhat difficult    BP Readings from Last 3 Encounters:  02/25/24 124/76  07/09/23 134/80  06/14/23 124/78    Physical Exam  Wt Readings from Last 3 Encounters:  02/25/24 281 lb 2 oz (127.5 kg)  07/09/23 283 lb (128.4 kg)  06/14/23 283 lb (128.4 kg)    There were no vitals taken for this visit.  Assessment and Plan:  Problem List Items Addressed This Visit   None   No follow-ups on file.    Leita HILARIO Adie,  MD [facility] Medicine [place]

## 2024-05-31 NOTE — Telephone Encounter (Signed)
Sent pt message on Mychart.  KP

## 2024-05-31 NOTE — Telephone Encounter (Signed)
 Refill sent ? ?KP ?

## 2024-05-31 NOTE — Telephone Encounter (Signed)
 Copied from CRM [id number]. Topic: Clinical - Medication Question >> May 31, 2024  9:42 AM Mia F wrote: Reason for CRM: Pt is following up in the albuterol  (VENTOLIN  HFA) 108 (90 Base) MCG/ACT inhaler. She is aware ned refills take up to 3 business days to complete but she says she is in need of the medication and out and asking if it could be expedited.

## 2024-05-31 NOTE — Telephone Encounter (Signed)
 Please review.  KP

## 2024-06-01 ENCOUNTER — Ambulatory Visit
Admission: EM | Admit: 2024-06-01 | Discharge: 2024-06-01 | Disposition: A | Discharge status: Home / Self Care | Attending: Emergency Medicine | Admitting: Emergency Medicine

## 2024-06-01 DIAGNOSIS — U071 COVID-19: Secondary | ICD-10-CM | POA: Diagnosis present

## 2024-06-01 LAB — RESP PANEL BY RT-PCR (FLU A&B, COVID) ARPGX2
Influenza A by PCR: NEGATIVE
Influenza B by PCR: NEGATIVE
SARS Coronavirus 2 by RT PCR: POSITIVE — AB

## 2024-06-01 LAB — GROUP A STREP BY PCR: Group A Strep by PCR: NOT DETECTED

## 2024-06-01 NOTE — Discharge Instructions (Signed)
 Your strep test is negative. You have tested positive for covid which is a viral illness: no antibiotic is indicated at this time, Take your home meds as prescribed. May treat your symptoms with OTC meds of choice as label directed. Make sure to drink plenty of fluids to stay hydrated(gatorade, water, popsicles,jello,etc), avoid caffeine products. Follow up with PCP. If you develop chest pain, palpations, shortness of breath go to ER for further evaluation.

## 2024-06-01 NOTE — ED Triage Notes (Signed)
 Patient states that she's had sx x 2 days  Headache Cough Sob Sore throat  Head congestion  Fever  Wheezing   Hx of asthma

## 2024-06-01 NOTE — ED Provider Notes (Signed)
 [facility]    CSN: [id number] Arrival date & time: 06/01/24  9147      History   Chief Complaint Chief Complaint  Patient presents with   Cough   Headache    HPI Joy Roberts is a 63 y.o. female.   63 year old female, Joy Roberts, presents to urgent care for evaluation of cough, headache ,shortness of breath, fever, wheezing x 2 days.  Patient has history of asthma, called PCP and got refills on asthma medicines yesterday , pt is here with family member with same symptoms.  Patient states her husband had same symptoms recently.  Patient is drinking well, voiding well  The history is provided by the patient. No language interpreter was used.    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      Home Medications    Prior to Admission medications   Medication Sig Start Date End Date Taking? Authorizing Provider  amLODipine -benazepril  (LOTREL) 5-20 MG capsule TAKE 1 CAPSULE BY MOUTH EVERY DAY  04/04/24  Yes Justus Leita DEL, MD  aspirin EC 81 MG tablet Take by mouth.   Yes [provider]  atorvastatin  (LIPITOR) 40 MG tablet Take 1 tablet (40 mg total) by mouth daily. 02/25/24  Yes Justus Leita DEL, MD  glipiZIDE  (GLUCOTROL  XL) 2.5 MG 24 hr tablet Take 1 tablet (2.5 mg total) by mouth daily. 02/25/24  Yes Justus Leita DEL, MD  hydrochlorothiazide  (HYDRODIURIL ) 25 MG tablet Take 1 tablet (25 mg total) by mouth daily. 02/25/24  Yes Justus Leita DEL, MD  IBUPROFEN PO Take by mouth as needed.   Yes [provider]  metFORMIN  (GLUCOPHAGE -XR) 500 MG 24 hr tablet Take 2 tablets (1,000 mg total) by mouth 2 (two) times daily. 02/25/24  Yes Justus Leita DEL, MD  Semaglutide , 2 MG/DOSE, 8 MG/3ML SOPN Inject 2 mg as directed once a week. 02/25/24  Yes Justus Leita DEL, MD  albuterol  (ACCUNEB ) 1.25 MG/3ML nebulizer solution INHALE 3 ML (1.25 MG TOTAL) BY NEBULIZATION EVERY 6 HOURS AS NEEDED FOR WHEEZE 05/31/24   Berglund, Laura H, MD  albuterol  (VENTOLIN  HFA) 108 (90 Base) MCG/ACT inhaler Inhale 2 puffs into the lungs every 4 (four) hours as needed for wheezing or shortness of breath. 05/31/24   Justus Leita DEL, MD  clotrimazole -betamethasone  (LOTRISONE ) cream Apply 1 Application topically daily. 07/09/23   Matthews, Jason J, MD  fluticasone  (FLONASE ) 50 MCG/ACT nasal spray Place 2 sprays into both  nostrils daily.    [provider]  fluticasone -salmeterol (ADVAIR DISKUS) 250-50 MCG/ACT AEPB Inhale 1 puff into the lungs in the morning and at bedtime. 05/31/24   Justus Leita DEL, MD  HYDROcodone -acetaminophen  (NORCO/VICODIN) 5-325 MG tablet Take 1 tablet by mouth every 4 (four) hours as needed. Patient not taking: Reported on 02/25/2024    [provider]  Insulin Pen Needle (PEN NEEDLES) 32G X 5 MM MISC 1 each by Does not apply route once a week. 02/09/23   Justus Leita DEL, MD  rOPINIRole  (REQUIP ) 0.5 MG tablet TAKE 1 TABLET BY MOUTH EVERYDAY AT BEDTIME 01/27/24    Justus Leita DEL, MD  Spacer/Aero-Holding Chambers (AEROCHAMBER MV) inhaler Use as instructed 08/28/21   Justus Leita DEL, MD    Family History Family History  Problem Relation Age of Onset   Rheum arthritis Mother    Hypertension Mother    Arthritis Mother    Stroke Father    Kidney failure Father    Heart disease Father    Hypertension Father     Social History Social History   Tobacco Use   Smoking status: Never   Smokeless tobacco: Never  Vaping Use   Vaping status: Never Used  Substance Use Topics   Alcohol use: No    Alcohol/week: 0.0 standard drinks of alcohol   Drug use: No     Allergies   Codeine and Sulfa antibiotics   Review of Systems Review of Systems  Constitutional:  Positive for fever.  HENT:  Positive for congestion and sore throat.   Respiratory:  Positive for cough, shortness of breath and wheezing.   Gastrointestinal:  Negative for diarrhea, nausea and vomiting.  Skin:  Negative for rash.  Neurological:  Positive for headaches.  All other systems reviewed and are negative.    Physical Exam Triage Vital Signs ED Triage Vitals  Encounter Vitals Group     BP      Girls Systolic BP Percentile      Girls Diastolic BP Percentile      Boys Systolic BP Percentile      Boys Diastolic BP Percentile      Pulse      Resp      Temp      Temp src      SpO2      Weight      Height      Head Circumference      Peak Flow      Pain Score      Pain Loc      Pain Education      Exclude from Growth Chart    No data found.  Updated Vital Signs BP 130/77 (BP Location: Left Wrist)   Pulse (!) 112   Temp 99.6 F (37.6 C) (Oral)   Resp 20   Wt 275 lb (124.7 kg)   SpO2 95%   BMI 45.76 kg/m   Visual Acuity Right Eye Distance:   Left Eye Distance:   Bilateral Distance:    Right Eye Near:   Left Eye Near:    Bilateral Near:     Physical Exam Vitals and nursing note reviewed.  Constitutional:      General: She is not in acute  distress.    Appearance: She is well-developed.  HENT:     Head: Normocephalic.     Right Ear: Tympanic membrane is retracted.     Left Ear: Tympanic membrane is retracted.     Nose: Congestion  present.     Mouth/Throat:     Lips: Pink.     Mouth: Mucous membranes are moist.     Pharynx: Oropharynx is clear.  Eyes:     General: Lids are normal.     Conjunctiva/sclera: Conjunctivae normal.     Pupils: Pupils are equal, round, and reactive to light.  Neck:     Trachea: No tracheal deviation.  Cardiovascular:     Rate and Rhythm: Regular rhythm. Tachycardia present.     Heart sounds: Normal heart sounds. No murmur heard.    Comments: Temp 99.6 Pulmonary:     Effort: Pulmonary effort is normal.     Breath sounds: Normal breath sounds and air entry. No wheezing.  Abdominal:     General: Bowel sounds are normal.     Palpations: Abdomen is soft.     Tenderness: There is no abdominal tenderness.  Musculoskeletal:        General: Normal range of motion.     Cervical back: Normal range of motion.  Lymphadenopathy:     Cervical: No cervical adenopathy.  Skin:    General: Skin is warm and dry.     Findings: No rash.  Neurological:     General: No focal deficit present.     Mental Status: She is alert and oriented to person, place, and time.     GCS: GCS eye subscore is 4. GCS verbal subscore is 5. GCS motor subscore is 6.  Psychiatric:        Attention and Perception: Attention normal.        Mood and Affect: Mood normal.        Speech: Speech normal.        Behavior: Behavior normal. Behavior is cooperative.      UC Treatments / Results  Labs (all labs ordered are listed, but only abnormal results are displayed) Labs Reviewed  RESP PANEL BY RT-PCR (FLU A&B, COVID) ARPGX2 - Abnormal; Notable for the following components:      Result Value   SARS Coronavirus 2 by RT PCR POSITIVE (*)    All other components within normal limits  GROUP A STREP BY PCR     EKG   Radiology No results found.  Procedures Procedures (including critical care time)  Medications Ordered in UC Medications - No data to display  Initial Impression / Assessment and Plan / UC Course  I have reviewed the triage vital signs and the nursing notes.  Pertinent labs & imaging results that were available during my care of the patient were reviewed by me and considered in my medical decision making (see chart for details).  Clinical Course as of 06/01/24 2115  Thu Jun 01, 2024  1038 Strep is negative  [JD]    Clinical Course User Index [JD] Aminta Loose, NP   Discussed exam findings and plan of care with patient, COVID-positive , strict go to ER precautions given.   Patient verbalized understanding to this provider.  Ddx: COVID, viral URI with cough, asthma flare, allergies Final Clinical Impressions(s) / UC Diagnoses   Final diagnoses:  COVID     Discharge Instructions      Your strep test is negative. You have tested positive for covid which is a viral illness: no antibiotic is indicated at this time, Take your home meds as prescribed. May treat your symptoms with OTC meds of choice as label directed. Make sure to drink plenty of fluids to stay hydrated(gatorade, water, popsicles,jello,etc), avoid caffeine products.  Follow up with PCP. If you develop chest pain, palpations, shortness of breath go to ER for further evaluation.     ED Prescriptions   None    PDMP not reviewed this encounter.   Aminta Loose, NP 06/01/24 2115

## 2024-06-15 ENCOUNTER — Encounter: Payer: Self-pay | Admitting: Internal Medicine

## 2024-06-27 ENCOUNTER — Ambulatory Visit: Admitting: Internal Medicine

## 2024-07-14 ENCOUNTER — Encounter: Payer: Self-pay | Admitting: Internal Medicine

## 2024-07-14 ENCOUNTER — Ambulatory Visit: Admitting: Internal Medicine

## 2024-07-14 VITALS — BP 126/78 | HR 78 | Ht 65.0 in | Wt 275.0 lb

## 2024-07-14 DIAGNOSIS — Z7985 Long-term (current) use of injectable non-insulin antidiabetic drugs: Secondary | ICD-10-CM

## 2024-07-14 DIAGNOSIS — Z23 Encounter for immunization: Secondary | ICD-10-CM

## 2024-07-14 DIAGNOSIS — I10 Essential (primary) hypertension: Secondary | ICD-10-CM | POA: Diagnosis not present

## 2024-07-14 DIAGNOSIS — E118 Type 2 diabetes mellitus with unspecified complications: Secondary | ICD-10-CM | POA: Diagnosis not present

## 2024-07-14 MED ORDER — BENAZEPRIL HCL 40 MG PO TABS
40.0000 mg | ORAL_TABLET | Freq: Every day | ORAL | 1 refills | Status: AC
Start: 2024-07-14 — End: ?

## 2024-07-14 MED ORDER — TIRZEPATIDE 2.5 MG/0.5ML ~~LOC~~ SOAJ: 2.5000 mg | SUBCUTANEOUS | 0 refills | Status: DC

## 2024-07-14 NOTE — Progress Notes (Signed)
 Date:  07/14/2024   Name:  Joy Roberts   DOB:  1961-03-25   MRN:  [id number]   Chief Complaint: Diabetes and Hypertension  Diabetes She presents for her follow-up diabetic visit. She has type 2 diabetes mellitus. Pertinent negatives for hypoglycemia include no dizziness or headaches. Pertinent negatives for diabetes include no chest pain, no fatigue and no weakness. Current diabetic treatments: MTF, glipizide  and Ozempic .  Hypertension This is a chronic problem. The problem is controlled. Pertinent negatives include no chest pain, headaches, palpitations or shortness of breath. Past treatments include calcium  channel blockers, ACE inhibitors and diuretics. The current treatment provides significant improvement.  Lichen planus oral - for years, mild and mostly posteriorly.  Seen by Dentist who noted possible gum hypertrophy as well and suggested stopping amlodipine .  She has been on amlodipine  for many years with well controlled bp.  Review of Systems  Constitutional:  Negative for fatigue and unexpected weight change.  HENT:  Negative for trouble swallowing.   Eyes:  Negative for visual disturbance.  Respiratory:  Negative for cough, chest tightness, shortness of breath and wheezing.   Cardiovascular:  Negative for chest pain, palpitations and leg swelling.  Gastrointestinal:  Negative for abdominal pain, constipation and diarrhea.  Musculoskeletal:  Negative for arthralgias and myalgias.  Neurological:  Negative for dizziness, weakness, light-headedness and headaches.     Lab Results  Component Value Date   NA 140 02/25/2024   K 4.5 02/25/2024   CO2 23 02/25/2024   GLUCOSE 96 02/25/2024   BUN 18 02/25/2024   CREATININE 1.18 (H) 02/25/2024   CALCIUM  10.0 02/25/2024   EGFR 53 (L) 02/25/2024   GFRNONAA 53 (L) 07/10/2020   Lab Results  Component Value Date   CHOL 122 02/25/2024   HDL 35 (L) 02/25/2024   LDLCALC 59 02/25/2024   TRIG 163 (H) 02/25/2024   CHOLHDL 3.5  02/25/2024   Lab Results  Component Value Date   TSH 1.930 02/25/2024   Lab Results  Component Value Date   HGBA1C 6.5 (H) 02/25/2024   Lab Results  Component Value Date   WBC 7.9 02/25/2024   HGB 12.6 02/25/2024   HCT 38.8 02/25/2024   MCV 88 02/25/2024   PLT 406 02/25/2024   Lab Results  Component Value Date   ALT 17 02/25/2024   AST 19 02/25/2024   ALKPHOS 83 02/25/2024   BILITOT 0.2 02/25/2024   No results found for: MARIEN BOLLS, VD25OH   Patient Active Problem List   Diagnosis Date Noted   COVID 06/01/2024   Restless leg syndrome 06/14/2023   Lipoma of left upper extremity 07/10/2020   Pseudophakia of left eye 05/21/2020   Pseudophakia of right eye 91/68/7978   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   Mild intermittent asthma without complication 09/26/2017   Calculus of kidney 09/26/2017   BMI 45.0-49.9, adult (HCC) 09/26/2017   Primary osteoarthritis of both knees 03/31/2016   Type II diabetes mellitus with complication (HCC) 03/31/2016   Essential hypertension 09/10/2014   Varicose veins of both lower extremities 03/27/2014   Umbilical hernia 09/23/2013   Hyperlipidemia associated with type 2 diabetes mellitus (HCC) 05/10/2012    Allergies  Allergen Reactions   Codeine Nausea And Vomiting   Sulfa Antibiotics Nausea And Vomiting, Nausea Only and Other (See Comments)    Past Surgical History:  Procedure Laterality Date   CESAREAN SECTION  2003   LITHOTRIPSY  01/11/2014    Social History   Tobacco Use  Smoking status: Never   Smokeless tobacco: Never  Vaping Use   Vaping status: Never Used  Substance Use Topics   Alcohol use: No    Alcohol/week: 0.0 standard drinks of alcohol   Drug use: No     Medication list has been reviewed and updated.  Current Meds  Medication Sig   albuterol  (ACCUNEB ) 1.25 MG/3ML nebulizer solution INHALE 3 ML (1.25 MG TOTAL) BY NEBULIZATION EVERY 6 HOURS AS NEEDED FOR WHEEZE   albuterol  (VENTOLIN  HFA)  108 (90 Base) MCG/ACT inhaler Inhale 2 puffs into the lungs every 4 (four) hours as needed for wheezing or shortness of breath.   aspirin EC 81 MG tablet Take by mouth.   atorvastatin  (LIPITOR) 40 MG tablet Take 1 tablet (40 mg total) by mouth daily.   benazepril  (LOTENSIN ) 40 MG tablet Take 1 tablet (40 mg total) by mouth daily.   clotrimazole -betamethasone  (LOTRISONE ) cream Apply 1 Application topically daily.   fluticasone  (FLONASE ) 50 MCG/ACT nasal spray Place 2 sprays into both nostrils daily.   fluticasone -salmeterol (ADVAIR DISKUS) 250-50 MCG/ACT AEPB Inhale 1 puff into the lungs in the morning and at bedtime.   glipiZIDE  (GLUCOTROL  XL) 2.5 MG 24 hr tablet Take 1 tablet (2.5 mg total) by mouth daily.   hydrochlorothiazide  (HYDRODIURIL ) 25 MG tablet Take 1 tablet (25 mg total) by mouth daily.   IBUPROFEN PO Take by mouth as needed.   Insulin Pen Needle (PEN NEEDLES) 32G X 5 MM MISC 1 each by Does not apply route once a week.   metFORMIN  (GLUCOPHAGE -XR) 500 MG 24 hr tablet Take 2 tablets (1,000 mg total) by mouth 2 (two) times daily.   rOPINIRole  (REQUIP ) 0.5 MG tablet TAKE 1 TABLET BY MOUTH EVERYDAY AT BEDTIME   Semaglutide , 2 MG/DOSE, 8 MG/3ML SOPN Inject 2 mg as directed once a week.   Spacer/Aero-Holding Chambers (AEROCHAMBER MV) inhaler Use as instructed   [DISCONTINUED] amLODipine -benazepril  (LOTREL) 5-20 MG capsule TAKE 1 CAPSULE BY MOUTH EVERY DAY       07/14/2024   10:54 AM 02/25/2024   11:07 AM 02/09/2023    9:26 AM 10/05/2022    9:06 AM  GAD 7 : Generalized Anxiety Score  Nervous, Anxious, on Edge 0 0 0 0  Control/stop worrying 0 0 0 0  Worry too much - different things 0 0 0 0  Trouble relaxing 0 3 2 0  Restless 0 0 0 0  Easily annoyed or irritable 0 0 0 0  Afraid - awful might happen 0 0 0 0  Total GAD 7 Score 0 3 2 0  Anxiety Difficulty Not difficult at all Somewhat difficult Not difficult at all Not difficult at all       07/14/2024   10:54 AM 02/25/2024   11:07  AM 02/09/2023    9:25 AM  Depression screen PHQ 2/9  Decreased Interest 1 2 2   Down, Depressed, Hopeless 1 0 0  PHQ - 2 Score 2 2 2   Altered sleeping 1 3 3   Tired, decreased energy 1 2 3   Change in appetite 0 0 0  Feeling bad or failure about yourself  0 0 0  Trouble concentrating 0 0 1  Moving slowly or fidgety/restless 0 3 0  Suicidal thoughts 0 0 0  PHQ-9 Score 4 10 9   Difficult doing work/chores Somewhat difficult Somewhat difficult Somewhat difficult    BP Readings from Last 3 Encounters:  07/14/24 126/78  06/01/24 130/77  02/25/24 124/76    Physical Exam Vitals and  nursing note reviewed.  Constitutional:      General: She is not in acute distress.    Appearance: She is well-developed.  HENT:     Head: Normocephalic and atraumatic.  Pulmonary:     Effort: Pulmonary effort is normal. No respiratory distress.  Skin:    General: Skin is warm and dry.     Findings: No rash.  Neurological:     Mental Status: She is alert and oriented to person, place, and time.  Psychiatric:        Mood and Affect: Mood normal.        Behavior: Behavior normal.     Wt Readings from Last 3 Encounters:  07/14/24 275 lb (124.7 kg)  06/01/24 275 lb (124.7 kg)  02/25/24 281 lb 2 oz (127.5 kg)    BP 126/78   Pulse 78   Ht 5' 5 (1.651 m)   Wt 275 lb (124.7 kg)   SpO2 97%   BMI 45.76 kg/m   Assessment and Plan:  Problem List Items Addressed This Visit       Unprioritized   Type II diabetes mellitus with complication (HCC) - Primary   Currently medications are MTF, glipizide  and Ozempic .  No hypoglycemic episodes noted. Home blood sugars in the 110 range. Last visit medical regimen changes were none.  She has lost a few more pounds but needs to lose more to have knee surgery. I suggest changing Ozempic  to Mounjaro  - Rx sent. Lab Results  Component Value Date   HGBA1C 6.5 (H) 02/25/2024  Will repeat A1C today.       Relevant Medications   tirzepatide  (MOUNJARO ) 2.5  MG/0.5ML Pen   benazepril  (LOTENSIN ) 40 MG tablet   Other Relevant Orders   Hemoglobin A1c   Essential hypertension (Chronic)   Well controlled blood pressure today. Current regimen is benazepril , amlodipine  and hctz.  Recent dental visit noted some mild gum hypertrophy and suggested stopping amlodipine . Will stop amlodipine , double benazepril  to 40 mg and continue hydrochlorothiazide . Follow up BP next visit.        Relevant Medications   benazepril  (LOTENSIN ) 40 MG tablet   Other Relevant Orders   Basic metabolic panel with GFR   Other Visit Diagnoses       Long-term current use of injectable noninsulin antidiabetic medication         Encounter for immunization       Relevant Orders   Flu vaccine trivalent PF, 6mos and older(Flulaval,Afluria,Fluarix,Fluzone) (Completed)       Return in about 4 months (around 11/11/2024) for DM, HTN - Dr. Lemon.    Leita HILARIO Adie, MD [facility] [place]

## 2024-07-14 NOTE — Assessment & Plan Note (Addendum)
 Currently medications are MTF, glipizide  and Ozempic .  No hypoglycemic episodes noted. Home blood sugars in the 110 range. Last visit medical regimen changes were none.  She has lost a few more pounds but needs to lose more to have knee surgery. I suggest changing Ozempic  to Mounjaro  - Rx sent. Lab Results  Component Value Date   HGBA1C 6.5 (H) 02/25/2024  Will repeat A1C today.

## 2024-07-14 NOTE — Assessment & Plan Note (Signed)
 Well controlled blood pressure today. Current regimen is benazepril , amlodipine  and hctz.  Recent dental visit noted some mild gum hypertrophy and suggested stopping amlodipine . Will stop amlodipine , double benazepril  to 40 mg and continue hydrochlorothiazide . Follow up BP next visit.

## 2024-07-15 ENCOUNTER — Encounter: Payer: Self-pay | Admitting: Internal Medicine

## 2024-07-15 LAB — BASIC METABOLIC PANEL WITH GFR
BUN/Creatinine Ratio: 13 (ref 12–28)
BUN: 16 mg/dL (ref 8–27)
CO2: 24 mmol/L (ref 20–29)
Calcium: 9.7 mg/dL (ref 8.7–10.3)
Chloride: 100 mmol/L (ref 96–106)
Creatinine, Ser: 1.24 mg/dL — ABNORMAL HIGH (ref 0.57–1.00)
Glucose: 85 mg/dL (ref 70–99)
Potassium: 4.5 mmol/L (ref 3.5–5.2)
Sodium: 140 mmol/L (ref 134–144)
eGFR: 49 mL/min/1.73 — ABNORMAL LOW (ref >59)

## 2024-07-15 LAB — HEMOGLOBIN A1C
Est. average glucose Bld gHb Est-mCnc: 134 mg/dL
Hgb A1c MFr Bld: 6.3 % — ABNORMAL HIGH (ref 4.8–5.6)

## 2024-07-16 ENCOUNTER — Ambulatory Visit: Payer: Self-pay | Admitting: Internal Medicine

## 2024-07-17 NOTE — Telephone Encounter (Signed)
 PT response.  JM

## 2024-07-17 NOTE — Telephone Encounter (Signed)
 Please review. Thank you JM

## 2024-07-23 ENCOUNTER — Other Ambulatory Visit: Payer: Self-pay | Admitting: Internal Medicine

## 2024-07-23 DIAGNOSIS — G2581 Restless legs syndrome: Secondary | ICD-10-CM

## 2024-07-25 NOTE — Telephone Encounter (Signed)
 Requested Prescriptions  Pending Prescriptions Disp Refills   rOPINIRole  (REQUIP ) 0.5 MG tablet [Pharmacy Med Name: ROPINIROLE  HCL 0.5 MG TABLET] 90 tablet 1    Sig: TAKE 1 TABLET BY MOUTH EVERYDAY AT BEDTIME     Neurology:  Parkinsonian Agents Passed - 07/25/2024 10:06 AM      Passed - Last BP in normal range    BP Readings from Last 1 Encounters:  07/14/24 126/78         Passed - Last Heart Rate in normal range    Pulse Readings from Last 1 Encounters:  07/14/24 78         Passed - Valid encounter within last 12 months    Recent Outpatient Visits           1 week ago Type II diabetes mellitus with complication [facility])   [place] [facility] at [facility], Leita DEL, MD   5 months ago Annual physical exam   [facility] at [facility], Leita DEL, MD

## 2024-08-23 ENCOUNTER — Other Ambulatory Visit: Payer: Self-pay | Admitting: Internal Medicine

## 2024-08-23 DIAGNOSIS — E118 Type 2 diabetes mellitus with unspecified complications: Secondary | ICD-10-CM

## 2024-08-25 ENCOUNTER — Other Ambulatory Visit: Payer: Self-pay

## 2024-08-25 MED ORDER — TIRZEPATIDE 5 MG/0.5ML ~~LOC~~ SOAJ: 5.0000 mg | SUBCUTANEOUS | 0 refills | Status: DC

## 2024-08-25 NOTE — Telephone Encounter (Signed)
 Requested medications are due for refill today.  yes  Requested medications are on the active medications list.  yes  Last refill. 07/14/2024 2mL 0 rf  Future visit scheduled.   yes  Notes to clinic.  Medication not assigned to a protocol. Please review for refill.     Requested Prescriptions  Pending Prescriptions Disp Refills   MOUNJARO  2.5 MG/0.5ML Pen [Pharmacy Med Name: MOUNJARO  2.5 MG/0.5 ML PEN]      Sig: INJECT 2.5 MG SUBCUTANEOUSLY WEEKLY     Off-Protocol Failed - 08/25/2024  7:55 AM      Failed - Medication not assigned to a protocol, review manually.      Passed - Valid encounter within last 12 months    Recent Outpatient Visits           1 month ago Type II diabetes mellitus with complication [facility])   [place] [facility] at [facility], Leita DEL, MD   6 months ago Annual physical exam   [facility] at [facility], Leita DEL, MD

## 2024-10-13 ENCOUNTER — Other Ambulatory Visit: Payer: Self-pay | Admitting: Internal Medicine

## 2024-10-13 NOTE — Telephone Encounter (Unsigned)
 Copied from CRM #[id number]. Topic: Clinical - Medication Refill >> Oct 13, 2024 11:07 AM Carlyon D wrote: Medication: tirzepatide  (MOUNJARO ) 5 MG/0.5ML Pen  Has the patient contacted their pharmacy? Yes (Agent: If no, request that the patient contact the pharmacy for the refill. If patient does not wish to contact the pharmacy document the reason why and proceed with request.) (Agent: If yes, when and what did the pharmacy advise?)  This is the patient's preferred pharmacy:  [facility] [phone] [address], [place] - [address] [place] [address] Phone: [phone] Fax: [phone]  Is this the correct pharmacy for this prescription? Yes If no, delete pharmacy and type the correct one.   Has the prescription been filled recently? No  Is the patient out of the medication? Yes  Has the patient been seen for an appointment in the last year OR does the patient have an upcoming appointment? Yes  Can we respond through MyChart? No, prefers phone call   Agent: Please be advised that Rx refills may take up to 3 business days. We ask that you follow-up with your pharmacy.

## 2024-10-16 ENCOUNTER — Ambulatory Visit: Payer: Self-pay | Admitting: Internal Medicine

## 2024-10-16 NOTE — Telephone Encounter (Signed)
 Requested medication (s) are due for refill today: na   Requested medication (s) are on the active medication list: yes   Last refill:  08/25/24 #2 ml 0 refills  Future visit scheduled: yes 11/17/24  Notes to clinic:  medication not assigned to a protocol. Do you want to refill Rx?     Requested Prescriptions  Pending Prescriptions Disp Refills   tirzepatide  (MOUNJARO ) 5 MG/0.5ML Pen 2 mL 0    Sig: Inject 5 mg into the skin once a week.     Off-Protocol Failed - 10/16/2024 11:35 AM      Failed - Medication not assigned to a protocol, review manually.      Passed - Valid encounter within last 12 months    Recent Outpatient Visits           3 months ago Type II diabetes mellitus with complication [facility])   [place] [facility] at [facility], Leita DEL, MD   7 months ago Annual physical exam   [facility] at [facility], Leita DEL, MD

## 2024-10-17 MED ORDER — TIRZEPATIDE 5 MG/0.5ML ~~LOC~~ SOAJ: 5.0000 mg | SUBCUTANEOUS | 0 refills | Status: AC

## 2024-10-17 NOTE — Telephone Encounter (Signed)
 Please review request to refill Monjauro medication

## 2024-10-17 NOTE — Telephone Encounter (Signed)
 Called patient to discuss her recent call. There was no answer to phone call. Unable to leave voicemail message.

## 2024-10-18 NOTE — Telephone Encounter (Signed)
 Called patient to confirm dose, she is on the 5 MG dose. Reviewed chart and Dr. Alvia sent in the prescription on 10/17/2024.

## 2024-11-17 ENCOUNTER — Ambulatory Visit: Admitting: Student
